# Patient Record
Sex: Female | Born: 1940 | Race: White | Smoking: Former smoker
Health system: Northeastern US, Academic
[De-identification: ages and names within clinical notes are randomized; demographics above are authoritative.]

## PROBLEM LIST (undated history)

## (undated) DIAGNOSIS — I10 Essential (primary) hypertension: Secondary | ICD-10-CM

## (undated) DIAGNOSIS — H269 Unspecified cataract: Secondary | ICD-10-CM

## (undated) DIAGNOSIS — H04123 Dry eye syndrome of bilateral lacrimal glands: Secondary | ICD-10-CM

## (undated) DIAGNOSIS — E785 Hyperlipidemia, unspecified: Secondary | ICD-10-CM

## (undated) DIAGNOSIS — G8929 Other chronic pain: Secondary | ICD-10-CM

## (undated) DIAGNOSIS — R7989 Other specified abnormal findings of blood chemistry: Secondary | ICD-10-CM

## (undated) DIAGNOSIS — K219 Gastro-esophageal reflux disease without esophagitis: Secondary | ICD-10-CM

## (undated) DIAGNOSIS — M1990 Unspecified osteoarthritis, unspecified site: Secondary | ICD-10-CM

## (undated) DIAGNOSIS — Z860100 Personal history of colon polyps, unspecified: Secondary | ICD-10-CM

## (undated) DIAGNOSIS — I714 Abdominal aortic aneurysm, without rupture, unspecified: Secondary | ICD-10-CM

## (undated) DIAGNOSIS — R339 Retention of urine, unspecified: Secondary | ICD-10-CM

## (undated) DIAGNOSIS — Z9889 Other specified postprocedural states: Secondary | ICD-10-CM

## (undated) DIAGNOSIS — M810 Age-related osteoporosis without current pathological fracture: Secondary | ICD-10-CM

## (undated) DIAGNOSIS — M479 Spondylosis, unspecified: Secondary | ICD-10-CM

## (undated) HISTORY — PX: SKIN BIOPSY: SHX1

## (undated) HISTORY — DX: Personal history of colon polyps, unspecified: Z86.0100

## (undated) HISTORY — PX: TUBAL LIGATION: SHX77

## (undated) HISTORY — DX: Abdominal aortic aneurysm, without rupture, unspecified: I71.40

## (undated) HISTORY — DX: Hyperlipidemia, unspecified: E78.5

## (undated) HISTORY — DX: Age-related osteoporosis without current pathological fracture: M81.0

## (undated) HISTORY — PX: EYE SURGERY: SHX253

## (undated) HISTORY — DX: Other specified postprocedural states: Z98.890

## (undated) HISTORY — DX: Retention of urine, unspecified: R33.9

## (undated) HISTORY — DX: Other specified abnormal findings of blood chemistry: R79.89

## (undated) HISTORY — DX: Essential (primary) hypertension: I10

## (undated) HISTORY — PX: HYSTERECTOMY: SHX81

## (undated) HISTORY — DX: Other chronic pain: G89.29

## (undated) HISTORY — PX: OTHER SURGICAL HISTORY: SHX169

## (undated) HISTORY — DX: Dry eye syndrome of bilateral lacrimal glands: H04.123

## (undated) HISTORY — DX: Unspecified cataract: H26.9

## (undated) HISTORY — DX: Personal history of colonic polyps: Z86.010

## (undated) HISTORY — DX: Unspecified osteoarthritis, unspecified site: M19.90

## (undated) HISTORY — DX: Gastro-esophageal reflux disease without esophagitis: K21.9

## (undated) HISTORY — PX: ABDOMINAL AORTIC ANEURYSM REPAIR: SHX42

## (undated) HISTORY — DX: Spondylosis, unspecified: M47.9

---

## 2000-11-20 ENCOUNTER — Other Ambulatory Visit: Payer: Self-pay | Admitting: Gastroenterology

## 2004-01-02 DIAGNOSIS — M5126 Other intervertebral disc displacement, lumbar region: Secondary | ICD-10-CM

## 2004-01-02 HISTORY — DX: Other intervertebral disc displacement, lumbar region: M51.26

## 2004-05-16 ENCOUNTER — Other Ambulatory Visit: Payer: Self-pay | Admitting: Gastroenterology

## 2009-01-01 HISTORY — PX: COLONOSCOPY: SHX174

## 2013-02-18 LAB — COMPREHENSIVE METABOLIC PANEL
ALT: 15
AST: 27
Albumin: 4.4
Alk Phos: 72
Anion Gap: 2
Bilirubin,Total: 0.3
CO2: 26
Calcium: 10.1
Chloride: 95
Creatinine: 0.7
GFR,Black: 99
GFR,Caucasian: 86
Glucose: 90
Lab: 9
Potassium: 5.4
Sodium: 138
Total Protein: 7.2

## 2013-02-18 LAB — LIPID PANEL
Chol/HDL Ratio: 1.7
Cholesterol: 210
HDL: 70
LDL Calculated: 122
Triglycerides: 89

## 2013-02-18 LAB — CBC AND DIFFERENTIAL
Baso # K/uL: 0.1 /uL
Basophil %: 1
Eos # K/uL: 0 /uL
Eosinophil %: 1
Hematocrit: 44
Hemoglobin: 14.4
Lymph # K/uL: 1.4 /uL
Lymphocyte %: 20
MCV: 95
Mono # K/uL: 0.7 /uL
Monocyte %: 9
Neut # K/uL: 5 /uL
Platelets: 322
RBC: 4.62
RDW: 13.5
Seg Neut %: 69
WBC: 7.3

## 2013-02-18 LAB — THYROXINE BINDING GLOBULIN: Thyroxine Bind Glob: 7.2

## 2013-02-18 LAB — CK: CK-MB: 69

## 2013-02-18 LAB — TSH: TSH: 1.58

## 2013-02-18 LAB — T4, FREE: T4: 1.22

## 2013-05-28 LAB — HEPATIC FUNCTION PANEL
ALT: 102
AST: 112
Albumin: 3.9
Alk Phos: 1123
Bilirubin,Direct: 0.17
Bilirubin,Total: 0.4
Protein,FL: 7

## 2013-05-28 LAB — LIPID PANEL
Chol/HDL Ratio: 1.6
Cholesterol: 164
HDL: 58
LDL Calculated: 93
Triglycerides: 64

## 2013-05-28 LAB — CK: CK-MB: 36

## 2013-06-01 LAB — HEPATIC FUNCTION PANEL
ALT: 69
AST: 54
Albumin: 4.2
Alk Phos: 847
Bilirubin,Direct: 0.1
Bilirubin,Total: 0.4
GGT: 996
Protein,FL: 7.4

## 2013-06-01 LAB — LIPASE: Lipase: 47

## 2013-06-01 LAB — AMYLASE: Amylase: 59

## 2013-06-02 ENCOUNTER — Encounter: Payer: Self-pay | Admitting: Gastroenterology

## 2013-06-18 LAB — HEPATIC FUNCTION PANEL
ALT: 16
AST: 27
Albumin: 4.1
Alk Phos: 254
Bilirubin,Direct: 0.05
Bilirubin,Total: 0.2
Protein,FL: 6.9

## 2013-10-15 ENCOUNTER — Ambulatory Visit: Payer: Self-pay | Admitting: Primary Care

## 2013-10-15 ENCOUNTER — Encounter: Payer: Self-pay | Admitting: Primary Care

## 2013-10-15 VITALS — BP 108/68 | HR 72 | Ht 64.96 in | Wt 126.4 lb

## 2013-10-15 DIAGNOSIS — M549 Dorsalgia, unspecified: Secondary | ICD-10-CM

## 2013-10-15 DIAGNOSIS — M810 Age-related osteoporosis without current pathological fracture: Secondary | ICD-10-CM | POA: Insufficient documentation

## 2013-10-15 DIAGNOSIS — Z23 Encounter for immunization: Secondary | ICD-10-CM

## 2013-10-15 DIAGNOSIS — E785 Hyperlipidemia, unspecified: Secondary | ICD-10-CM

## 2013-10-15 DIAGNOSIS — K219 Gastro-esophageal reflux disease without esophagitis: Secondary | ICD-10-CM

## 2013-10-15 DIAGNOSIS — Z1231 Encounter for screening mammogram for malignant neoplasm of breast: Secondary | ICD-10-CM

## 2013-10-15 DIAGNOSIS — M545 Low back pain, unspecified: Secondary | ICD-10-CM | POA: Insufficient documentation

## 2013-10-15 DIAGNOSIS — I714 Abdominal aortic aneurysm, without rupture, unspecified: Secondary | ICD-10-CM | POA: Insufficient documentation

## 2013-10-15 DIAGNOSIS — R7989 Other specified abnormal findings of blood chemistry: Secondary | ICD-10-CM

## 2013-10-15 NOTE — Patient Instructions (Addendum)
Start taking omeprazole 20 mg twice a day  Start Aleve 220mg  twice a day     Get labs done in next few weeks; do not restart cholesterol medication yet     Mammogram and DEXA scan to be done at [facility] in [place]

## 2013-10-15 NOTE — Progress Notes (Signed)
New Patient Visit    SUBJECTIVE  Patient here to establish care    Concerns today include:     Hyperlipidemia: Atorvastatin; but then had elevated LFTs, alk phos and they took her off and hasn't been on it since June; had an abdominal U/S also has abdominal aortic aneurysm     Back pain: herniated discs, had MRI 2002/2006; L5/S1 root compression; it was severe and couldn't walk at times due to pain. Had cortisone shots, had been on some pain medication but doesn't take it now (dislikes how she feels). Did try NSAIDs, Aleve helps a bit, but it aggravates her stomach. Was told to see a surgeon but is terrified of paralysis. When walking a lot gets sciatic on left side.   When sitting and goes to get up her front near hips hurt.   Also occasional knee pain.   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 Sleeps 5-6 hours; not sure if its just due to having to urinate or not; doesn't really seem to be something she wishes to address     Constipation: Metamucil wafers in am; some IBS-issues; does take a woman's laxative on occasion   Colonoscopy: 2011; some hemorrhoids, 1-2 polyps; unsure if they said to have it redone in 5 yrs     Pap: GYN had told you didn't need another one but its probably   Mammogram: done 2014; sister had breast cancer     Exercise: [employer] 2x a week   Diet: No specifics       Patient Active Problem List   Diagnosis Code    AAA (abdominal aortic aneurysm) I71.4    Osteoporosis M81.0    Hyperlipidemia E78.5    GERD (gastroesophageal reflux disease) K21.9    Back pain M54.9      Past Medical History   Diagnosis Date    AAA (abdominal aortic aneurysm)     Lumbar disc herniation     Urinary retention       Past Surgical History   Procedure Laterality Date    Tubal ligation      Hysterectomy       left ovaries in place      Family History   Problem Relation Age of Onset    Dementia Mother 28    Cancer Mother      lung    High cholesterol Mother     Parkinsonism Father     Heart disease Father 1     passed    High cholesterol Father     Cancer Sister      Breast; in recovery    High cholesterol Sister     Cancer Brother      prostate    High cholesterol Brother      History     Social History    Marital Status: Divorced     Spouse Name: N/A     Number of Children: N/A    Years of Education: N/A     Occupational History    Not on file.     Social History Main Topics    Smoking status: Former  Smoker -- 1.00 packs/day for 30 years     Quit date: 06/15/2013    Smokeless tobacco: Not on file    Alcohol Use: 5.4 oz/week     9 Glasses of wine per week    Drug Use: No    Sexual Activity: Not Currently     Other Topics Concern    Not on file     Social History Narrative    Lives alone currently in townhome     2 brothers and 2 sisters that live here    2 boys ([place], [place])    Grandchildren      No Known Allergies (drug, envir, food or latex)    Safety   Parameter Yes No Sometimes NA Details   Domestic Violence  x      Environmental Exposure  x      Sunscreen x       Dentist q 6 months x       Vision Exam q 2 years x       Seatbelt x       Cycling Helmet    x    Smoke detectors x       CO detectors x       Firearms   x          Health Care Maintenance   Parameter UTD Need NA Declined Given Comment   CRC screen (50+) x         Mammogram (F40-75)  x    12/04/12   Pap (F21-65)         Dexa (F65+M70+)  x    2013   Abd [place] (M65+) x        Lipids  x       Hepatitis C (1945-1965)   x      HIV    x     Tdap      Unsure   Prevnar (65+)1ST  x   x    Pneumovax (65+)  x       Zostavax  x       Gardasil    x      Flu  x             Review of Systems NEGATIVE UNLESS BOLD   General: Lack of energy, unexplained  weight gain or loss, loss of appetite, fever, night sweats, or weakness  ENT: Hearing difficulty, ear ringing or pain, sinus problems, nasal congestion, nosebleeds, mouth lesions, dental problems or throat pain  CV: irregular heartbeat, racing heart, chest pain, leg swelling, leg pain with walking, or DOE  Resp: shortness of breath, cough, coughing up blood, wheezing, or pleuritic pain  GI:  dysphagia, heartburn, nausea, vomiting, constipation, diarrhea, abdominal pain, blood in stool, melena, or changes in bowel habits  GY:FVCBSWH, urinary frequency or urgency, urinary incontinence, nocturia, or sexual dysfunction  MSK:  joint pain, joint swelling, back pain, myalgias, or muscle weakness  Derm:  rash, itching, new or changing skin lesions, hair loss or change  Neuro:  headache, vision changes, numbness, parasthesias, balance or gait difficulty, dizziness, tremor, or uncontrolled movements  Psych:  insomnia, irritability, depression, anxiety, mood swings, frequent anger, or hallucinations  Endocrine: heat or cold intolerance, menstrual irregularities, breast lumps, polydipsia, or libido changes  Heme:  easy bruising or bleeding or unexplained swelling  Allergy and Immunology: itchy or watery eyes or nose, no frequent infections    Current Outpatient Prescriptions   Medication Sig  omeprazole (PRILOSEC) 20 MG capsule Take 1 capsule (20 mg total) by mouth 2 times daily    Multiple Vitamins-Minerals (MULTIVITAMIN & MINERAL PO) Take 1 tablet by mouth daily    aspirin 81 MG tablet Take 81 mg by mouth daily    calcium-vitamin D (OSCAL-500) 500-200 MG-UNIT per tablet Take 2 tablets by mouth 2 times daily     OBJECTIVE  BP 108/68 mmHg   Pulse 72   Ht 1.65 m (5' 4.96")   Wt 57.335 kg (126 lb 6.4 oz)   BMI 21.06 kg/m2        Physical Exam:   GENERAL:  Appears stated age, no distress   HEAD: Normal, no trauma, bruises  EYES: PERRL, EOMI, clear conjunctiva    NOSE: clear passages, normal turbinates  EARS: Normal canals/TM    THROAT/MOUTH: Clear OP; no lesions, dentures   NECK: No lymphadenopathy, no thyromegaly   CV: Regular rate, rhythm, no murmur, good pedal pulses   LUNGS: CTA bilaterally, normal effort  ABD: BS+; non-tender to palpation, no HSM   EXT: warm, well perfused, no edema   MSK: No spinous process tenderness, Negative SLR on right and left. Mild lumbar tenderness  Lateral hip not TTP. Some anterior hip pain with palpation but almost normal hip ROM, negative fadir/faber, good hip flexion.   NEURO: No facial asymmetry; no weakness; can rise from chair without using arms   PSYCH: Normal affect/mood     ASSESSMENT & PLAN  Problem List Items Addressed This Visit     AAA (abdominal aortic aneurysm)    Relevant Medications       aspirin 81 MG tablet    Osteoporosis    Relevant Orders       DXA Scan       CBC       Basic metabolic panel    Hyperlipidemia    Relevant Medications       aspirin 81 MG tablet    Other Relevant Orders       Lipid add Rfx to Drt LDL if Trig >400    GERD (gastroesophageal reflux disease)    Relevant Medications       omeprazole (PriLOSEC) capsule    Back pain - Primary    Relevant Medications       aspirin 81 MG tablet    Other Relevant Orders       CBC      Other Visit Diagnoses     Need for prophylactic vaccination against Streptococcus pneumoniae (pneumococcus)         Relevant Orders        Pneumococcal conjugate vaccine 13-valent (Completed)     Visit for screening mammogram         Relevant Orders        Mammography screening bilateral     Elevated LFTs         Relevant Orders        Hepatic function panel        Lipase         Back pain: history of multiple level disc herniation but imaging is from 2002/2006; she is very fearful of the MRI due to claustrophobia and does not think she could do one again. Initially she sounded like she wanted to see a back specialist but after discussing with her the options they would do and that she'd like need further imaging she decided to wait it out for now.  On exam she didn't have signs of  nerve impingement/sciatica but it sounds like its intermittent to her. Discussed restarting Aleve just twice a day to be taken with food. I also want her to restart omeprazole 20 mg bid. She is going to [employer] and doing circuit training there. While this is good for her health/bone health at some point more focused back physical therapy and exercises may serve her better.     Hyperlipidemia with recent elevated LFTs; alk phos; was attributed to change in statin, however review of her U/S she does have cholelithiasis and her Alk phos was significantly higher than LFTs which makes me suspect gallstone obstruction and then possible worsening due to statin. She has been off medication since June now. Will repeat. If all normal could restart another statin at a low dose if needed based on her risk factors. She has now quit smoking for 4 months as well so her risk would have changed based on that alone.     GERD: was previously on omeprazole, also instructed to stop taking that when LFTs increased. Since she wants to restart Aleve I want her to take this in a divided dose for extra protection of her stomach. I also discussed with her that if she is taking aleve daily that we could stop her aspirin (cox inhibition) and this could also decrease her risk of a GI bleed.     Osteoporosis: patient reports that she does have this, I have no old records but she has not had a DEXA in over 2 years. Will send for one along with mammogram. We would have to discuss the risk vs. Benefit if another medication would be indicated. She's currently taking calcium and vitamin D     Immunizations: Denies ever having a pneumonia shot; or shingles vaccine. She likely truly did not have Prevnar 13; given today. Advised to return for flu shot next week for nurse visit or get at pharmacy (she didn't want 2 in one day). She would need pneumovax in a year if she truly hasn't had it. Also zostavax     AAA: 3.8X2.5cm;  U/S done 06/02/2013     Pt agrees with and understands plan.    Will obtain old records/results from previous physician      Return for Will schedule after lab results . Return to office sooner prn.      Karren Burly, MD

## 2013-10-16 MED ORDER — OMEPRAZOLE 20 MG PO CPDR *I*
20.0000 mg | DELAYED_RELEASE_CAPSULE | Freq: Two times a day (BID) | ORAL | Status: DC
Start: 2013-10-16 — End: 2013-10-24

## 2013-10-20 ENCOUNTER — Ambulatory Visit
Admit: 2013-10-20 | Discharge: 2013-10-20 | Disposition: A | Discharge status: Home / Self Care | Payer: Self-pay | Source: Ambulatory Visit | Attending: Primary Care | Admitting: Primary Care

## 2013-10-20 DIAGNOSIS — M549 Dorsalgia, unspecified: Secondary | ICD-10-CM

## 2013-10-20 DIAGNOSIS — R7989 Other specified abnormal findings of blood chemistry: Secondary | ICD-10-CM

## 2013-10-20 DIAGNOSIS — E785 Hyperlipidemia, unspecified: Secondary | ICD-10-CM

## 2013-10-20 DIAGNOSIS — M810 Age-related osteoporosis without current pathological fracture: Secondary | ICD-10-CM

## 2013-10-20 LAB — CBC
Hematocrit: 40 % (ref 34–45)
Hemoglobin: 13.4 g/dL (ref 11.2–15.7)
MCH: 31 pg/cell (ref 26–32)
MCHC: 33 g/dL (ref 32–36)
MCV: 93 fL (ref 79–95)
Platelets: 284 10*3/uL (ref 160–370)
RBC: 4.3 MIL/uL (ref 3.9–5.2)
RDW: 13.2 % (ref 11.7–14.4)
WBC: 5 10*3/uL (ref 4.0–10.0)

## 2013-10-20 LAB — BASIC METABOLIC PANEL
Anion Gap: 13 (ref 7–16)
CO2: 28 mmol/L (ref 20–28)
Calcium: 9.3 mg/dL (ref 8.6–10.2)
Chloride: 100 mmol/L (ref 96–108)
Creatinine: 0.88 mg/dL (ref 0.51–0.95)
GFR,Black: 75 *
GFR,Caucasian: 65 *
Glucose: 100 mg/dL — ABNORMAL HIGH (ref 60–99)
Lab: 13 mg/dL (ref 6–20)
Potassium: 5.1 mmol/L (ref 3.3–5.1)
Sodium: 141 mmol/L (ref 133–145)

## 2013-10-20 LAB — HEPATIC FUNCTION PANEL
ALT: 17 U/L (ref 0–35)
AST: 26 U/L (ref 0–35)
Albumin: 4.6 g/dL (ref 3.5–5.2)
Alk Phos: 65 U/L (ref 35–105)
Bilirubin,Direct: <0.2 mg/dL (ref 0.0–0.3)
Bilirubin,Total: 0.4 mg/dL (ref 0.0–1.2)
Total Protein: 7 g/dL (ref 6.3–7.7)

## 2013-10-20 LAB — LIPID PANEL
Chol/HDL Ratio: 3.1
Cholesterol: 269 mg/dL — AB
HDL: 88 mg/dL
LDL Calculated: 166 mg/dL
Non HDL Cholesterol: 181 mg/dL
Triglycerides: 73 mg/dL

## 2013-10-20 LAB — LIPASE: Lipase: 39 U/L (ref 13–60)

## 2013-10-23 ENCOUNTER — Encounter: Payer: Self-pay | Admitting: Primary Care

## 2013-10-23 ENCOUNTER — Other Ambulatory Visit: Payer: Self-pay

## 2013-10-23 DIAGNOSIS — K8020 Calculus of gallbladder without cholecystitis without obstruction: Secondary | ICD-10-CM | POA: Insufficient documentation

## 2013-10-23 DIAGNOSIS — K219 Gastro-esophageal reflux disease without esophagitis: Secondary | ICD-10-CM

## 2013-10-23 NOTE — Telephone Encounter (Signed)
Pt filled script at [facility] and was charged $14.00; next month she would like to fill script at [facility] so her insurance will cover this. Also pt asking to restart Simvastatin and has a couple weeks of med at home. She would like a call back letting her know if this would be okay or not.

## 2013-10-24 MED ORDER — OMEPRAZOLE 20 MG PO CPDR *I*
20.0000 mg | DELAYED_RELEASE_CAPSULE | Freq: Two times a day (BID) | ORAL | Status: DC
Start: 2013-10-24 — End: 2014-01-21

## 2013-10-26 ENCOUNTER — Other Ambulatory Visit: Payer: Self-pay | Admitting: Primary Care

## 2013-10-26 MED ORDER — ATORVASTATIN CALCIUM 10 MG PO TABS *I*
10.0000 mg | ORAL_TABLET | Freq: Every day | ORAL | Status: DC
Start: 2013-10-26 — End: 2013-10-27

## 2013-10-27 ENCOUNTER — Encounter: Payer: Self-pay | Admitting: Primary Care

## 2013-10-27 MED ORDER — SIMVASTATIN 5 MG PO TABS *I*
5.0000 mg | ORAL_TABLET | Freq: Every day | ORAL | Status: DC
Start: 2013-10-27 — End: 2014-01-21

## 2013-11-03 ENCOUNTER — Encounter: Payer: Self-pay | Admitting: Primary Care

## 2013-11-03 ENCOUNTER — Telehealth: Payer: Self-pay | Admitting: Primary Care

## 2013-11-03 DIAGNOSIS — D126 Benign neoplasm of colon, unspecified: Secondary | ICD-10-CM | POA: Insufficient documentation

## 2013-11-03 NOTE — Telephone Encounter (Signed)
Patient called in to schedule her flu shot but also inquired about discussing with Dr. Ruthy Dick taking simvastatin again. Was concerned because she previously was taking 40mg , but says Dr. Ruthy Dick only suggested 5mg . Wondering why the dose is so low because her cholesterol jumped from 210 to 269 while she was off due to her aneurysm?

## 2013-11-03 NOTE — Telephone Encounter (Signed)
Left message for patient to call back  (Patient was taking Atorvastatin, but started to bother her stomach - starting on low dose Simvastatin -  If not bothering her stomach can increase per Dr. Ardeen Jourdain notes.)

## 2013-11-04 ENCOUNTER — Ambulatory Visit: Payer: Self-pay

## 2013-11-04 DIAGNOSIS — Z23 Encounter for immunization: Secondary | ICD-10-CM

## 2013-11-04 NOTE — Progress Notes (Signed)
Injection given per protocol - patient tolerated procedure well

## 2013-12-10 ENCOUNTER — Ambulatory Visit
Admit: 2013-12-10 | Discharge: 2013-12-10 | Disposition: A | Discharge status: Home / Self Care | Payer: Self-pay | Source: Ambulatory Visit | Attending: Primary Care | Admitting: Primary Care

## 2013-12-10 ENCOUNTER — Ambulatory Visit: Payer: Self-pay

## 2013-12-10 ENCOUNTER — Encounter: Payer: Self-pay | Admitting: Gastroenterology

## 2013-12-10 DIAGNOSIS — M810 Age-related osteoporosis without current pathological fracture: Secondary | ICD-10-CM

## 2013-12-10 LAB — HM DEXA SCAN

## 2013-12-11 NOTE — Procedures (Signed)
Providers: DXA scan for your review.  Images and report are located under the media tab.  For Mychart active patients, please contact your referring provider for results and/or for  requests for a copy of this result.

## 2013-12-18 ENCOUNTER — Other Ambulatory Visit: Payer: Self-pay | Admitting: Radiology

## 2013-12-23 ENCOUNTER — Encounter: Payer: Self-pay | Admitting: Gastroenterology

## 2014-01-12 ENCOUNTER — Encounter: Payer: Self-pay | Admitting: Primary Care

## 2014-01-13 NOTE — Telephone Encounter (Signed)
Results scanned in system - please advise

## 2014-01-21 ENCOUNTER — Telehealth: Payer: Self-pay

## 2014-01-21 ENCOUNTER — Ambulatory Visit: Payer: Self-pay | Admitting: Primary Care

## 2014-01-21 ENCOUNTER — Encounter: Payer: Self-pay | Admitting: Primary Care

## 2014-01-21 VITALS — BP 124/72 | HR 76 | Ht 64.96 in | Wt 137.1 lb

## 2014-01-21 DIAGNOSIS — M810 Age-related osteoporosis without current pathological fracture: Secondary | ICD-10-CM

## 2014-01-21 DIAGNOSIS — E785 Hyperlipidemia, unspecified: Secondary | ICD-10-CM

## 2014-01-21 DIAGNOSIS — R7989 Other specified abnormal findings of blood chemistry: Secondary | ICD-10-CM

## 2014-01-21 DIAGNOSIS — K219 Gastro-esophageal reflux disease without esophagitis: Secondary | ICD-10-CM

## 2014-01-21 DIAGNOSIS — M25551 Pain in right hip: Secondary | ICD-10-CM

## 2014-01-21 MED ORDER — ALENDRONATE SODIUM 70 MG PO TABS *I*
70.0000 mg | ORAL_TABLET | ORAL | Status: DC
Start: 2014-01-21 — End: 2014-02-15

## 2014-01-21 MED ORDER — OMEPRAZOLE 20 MG PO CPDR *I*
20.0000 mg | DELAYED_RELEASE_CAPSULE | Freq: Two times a day (BID) | ORAL | Status: DC
Start: 2014-01-21 — End: 2015-01-26

## 2014-01-21 MED ORDER — SIMVASTATIN 10 MG PO TABS *I*
10.0000 mg | ORAL_TABLET | Freq: Every day | ORAL | Status: DC
Start: 2014-01-21 — End: 2014-01-21

## 2014-01-21 MED ORDER — TRAMADOL HCL 50 MG PO TABS *I*
50.0000 mg | ORAL_TABLET | Freq: Two times a day (BID) | ORAL | Status: DC | PRN
Start: 2014-01-21 — End: 2014-08-27

## 2014-01-21 MED ORDER — SIMVASTATIN 10 MG PO TABS *I*
10.0000 mg | ORAL_TABLET | Freq: Every day | ORAL | Status: DC
Start: 2014-01-21 — End: 2014-04-22

## 2014-01-21 NOTE — Telephone Encounter (Signed)
-----   Message from Karren Burly, MD sent at 01/21/2014 11:37 AM EST -----  Regarding: Fosamax   I have sent in Fosamax for her to try again to see how she tolerates it. Through [facility] she can get it for 9 dollars without any insurance for a month's supply.     I have printed one for her here, we can mail it to her if she'd like or she can use the app if she has a smart phone or print it at home if she wants.     Can you call her to let her know and she if she'll try it. I looked into another type but its too expensive even with coupon.

## 2014-01-21 NOTE — Progress Notes (Signed)
Follow-up      Patient is here today to follow-up on :       Hip pain is worsening on the right; back pain is about the same  [place] [address] about a month and a half ago; and fell on her right side. Now it hurts to walk, its actually a little better than it was initially she was really struggling.   Some days she has to pick her leg up to get in the car, some days she goes to her hands and knees to go up stairs. Other days she's in pain but can do most things.   She's been using aleve to help with the pain but is not able to sleep because its that uncomfortable.     Osteoporosis: She called her old office, her last DEXA showed -3.0; now she's -3.3; she had tried fosamax in the past but it gave her abdominal cramps for the 2-3 days after she took it. She is afraid she wouldn't be able to afford alternatives. Is taking calcium and vitamin D now; wants to know if daying Co-Q 10 or Glucosamine would help. I advised her not likely.   She wants to delay any referrals about this until spring because she won't drive there.     She needs handicap parking paperwork today as well. I completed that.     HLD: had elevated LFTs so I restarted her slowly on simvastatin. Will increase to 10 mg today; she's not had any issues with the 5 mg     GERD: on omeprazole BID and that's working well although if she takes 3 aleve she just get some stomach discomfort     AAA: needs repeat ultrasound but doesn't want to go in winter, will re-order in spring.      ROS: See HPI     The patient's past medical history, problem list, medications and allergies reviewed in electronic chart today.      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     Current outpatient prescriptions: naproxen sodium (ANAPROX) 220 MG tablet, Take 220 mg by mouth 2  times daily (with meals), Disp: , Rfl: ;  Multiple Vitamins-Minerals (MULTIVITAMIN & MINERAL PO), Take 1 tablet by mouth daily, Disp: , Rfl: ;  aspirin 81 MG tablet, Take 81 mg by mouth daily, Disp: , Rfl: ;  calcium-vitamin D (OSCAL-500) 500-200 MG-UNIT per tablet, Take 2 tablets by mouth 2 times daily, Disp: , Rfl:   traMADol (ULTRAM) 50 MG tablet, Take 1 tablet (50 mg total) by mouth 2 times daily as needed for Pain   Max daily dose: 100 mg, Disp: 60 tablet, Rfl: 0;  omeprazole (PRILOSEC) 20 MG capsule, Take 1 capsule (20 mg total) by mouth 2 times daily, Disp: 180 capsule, Rfl: 1;  simvastatin (ZOCOR) 10 MG tablet, Take 1 tablet (10 mg total) by mouth daily (with dinner), Disp: 90 tablet, Rfl: 3            BP 124/72 mmHg   Pulse 76   Ht 1.65 m (5' 4.96")   Wt 62.188 kg (137 lb 1.6 oz)   BMI 22.84 kg/m2  Exam:     General: Alert, no distress  HEENT: clear conjunctiva  CV: Regular rate, rhythm, no murmur, good pedal pulses   LUNGS: CTA bilaterally   MSK: Right sided hip pain at groin and lateral hip but not really tender over greater trochanter. Pain with FADIR/FABER testing. Pretty normal log-roll compared to left. Pain with hip flexion, and pain with Ober's test.         Problem List Items Addressed This Visit        Digestive    GERD (gastroesophageal reflux disease)    Relevant Medications       omeprazole (PriLOSEC) capsule       Endocrine    Hyperlipidemia    Relevant Medications       simvastatin (ZOCOR) tablet    Other Relevant Orders       Lipid add Rfx to Drt LDL if Trig >400       Other    Osteoporosis      Other Visit Diagnoses     Right hip pain    -  Primary     Relevant Orders        * Hip RIGHT AP and Lateral views     Elevated liver function tests         Relevant Orders        Comprehensive metabolic panel           Right hip pain: will get xray; may also have an element of ITB syndrome given location of pain, sent exercises to try at home.   Will start tramadol for a night pain as she's having  trouble sleeping due to the discomfort. There is a possibliity this is coming from her back as well but patient again states she'll never do another MRI so I don't know if we'd be able further treat it without knowing the extend of the problem there.   Handicap parking forms completed  Can continue aleve but only twice a day.     Osteoporosis: advised patient she is at VERY high risk for fracture and calc and vita D is not enough because she's been on that and in two years has gone from -3.0 to -3.3. She's lucky she didn't fracture anything with recent fall.   Would try risedronate but she cannot afford it.   Will try Fosamax again, if she doesn't tolerate it then she'll need to see endocrinology about possible injection treatments.     HLD: uncontrolled, gradually increaseing simvastatin given eleveted LFTs last year. Will go to 10 mg and check again in three months    GERD: continue omeprazole especially in light of aleve use and restarting fosamax if patient will do so.       F/U 3 months     Karren Burly, MD  [facility] [address]  [place] [address]

## 2014-01-21 NOTE — Telephone Encounter (Signed)
Spoke with pt and informed her about the coupon and she stated that she would print it out and if she cannot figure out how to get the coupon she will call back and have [place] mail it to her.

## 2014-01-26 ENCOUNTER — Ambulatory Visit
Admit: 2014-01-26 | Discharge: 2014-01-26 | Disposition: A | Discharge status: Home / Self Care | Payer: Self-pay | Source: Ambulatory Visit | Attending: Primary Care | Admitting: Primary Care

## 2014-01-26 ENCOUNTER — Other Ambulatory Visit: Payer: Self-pay | Admitting: Primary Care

## 2014-02-15 ENCOUNTER — Encounter: Payer: Self-pay | Admitting: Primary Care

## 2014-02-15 DIAGNOSIS — M810 Age-related osteoporosis without current pathological fracture: Secondary | ICD-10-CM

## 2014-02-15 MED ORDER — ALENDRONATE SODIUM 70 MG PO TABS *I*
70.0000 mg | ORAL_TABLET | ORAL | Status: DC
Start: 2014-02-15 — End: 2014-03-15

## 2014-03-15 ENCOUNTER — Other Ambulatory Visit: Payer: Self-pay | Admitting: Primary Care

## 2014-03-15 DIAGNOSIS — M810 Age-related osteoporosis without current pathological fracture: Secondary | ICD-10-CM

## 2014-03-15 MED ORDER — ALENDRONATE SODIUM 70 MG PO TABS *I*
70.0000 mg | ORAL_TABLET | ORAL | Status: DC
Start: 2014-03-15 — End: 2014-05-10

## 2014-04-15 ENCOUNTER — Ambulatory Visit
Admit: 2014-04-15 | Discharge: 2014-04-15 | Disposition: A | Discharge status: Home / Self Care | Payer: Self-pay | Source: Ambulatory Visit | Attending: Primary Care | Admitting: Primary Care

## 2014-04-15 DIAGNOSIS — E785 Hyperlipidemia, unspecified: Secondary | ICD-10-CM

## 2014-04-15 DIAGNOSIS — R7989 Other specified abnormal findings of blood chemistry: Secondary | ICD-10-CM

## 2014-04-15 LAB — COMPREHENSIVE METABOLIC PANEL
ALT: 18 U/L (ref 0–35)
AST: 26 U/L (ref 0–35)
Albumin: 4.8 g/dL (ref 3.5–5.2)
Alk Phos: 61 U/L (ref 35–105)
Anion Gap: 16 (ref 7–16)
Bilirubin,Total: 0.4 mg/dL (ref 0.0–1.2)
CO2: 25 mmol/L (ref 20–28)
Calcium: 9 mg/dL (ref 8.6–10.2)
Chloride: 98 mmol/L (ref 96–108)
Creatinine: 0.8 mg/dL (ref 0.51–0.95)
GFR,Black: 84 *
GFR,Caucasian: 73 *
Glucose: 103 mg/dL — ABNORMAL HIGH (ref 60–99)
Lab: 15 mg/dL (ref 6–20)
Potassium: 4.7 mmol/L (ref 3.3–5.1)
Sodium: 139 mmol/L (ref 133–145)
Total Protein: 7.4 g/dL (ref 6.3–7.7)

## 2014-04-15 LAB — LIPID PANEL
Chol/HDL Ratio: 2.9
Cholesterol: 231 mg/dL — AB
HDL: 79 mg/dL
LDL Calculated: 137 mg/dL
Non HDL Cholesterol: 152 mg/dL
Triglycerides: 73 mg/dL

## 2014-04-16 ENCOUNTER — Encounter: Payer: Self-pay | Admitting: Primary Care

## 2014-04-22 ENCOUNTER — Ambulatory Visit: Payer: Self-pay | Admitting: Primary Care

## 2014-04-22 ENCOUNTER — Telehealth: Payer: Self-pay | Admitting: Primary Care

## 2014-04-22 ENCOUNTER — Encounter: Payer: Self-pay | Admitting: Primary Care

## 2014-04-22 VITALS — BP 124/76 | HR 72 | Wt 138.9 lb

## 2014-04-22 DIAGNOSIS — M545 Low back pain, unspecified: Secondary | ICD-10-CM

## 2014-04-22 DIAGNOSIS — M25551 Pain in right hip: Secondary | ICD-10-CM

## 2014-04-22 DIAGNOSIS — E785 Hyperlipidemia, unspecified: Secondary | ICD-10-CM

## 2014-04-22 DIAGNOSIS — I714 Abdominal aortic aneurysm, without rupture, unspecified: Secondary | ICD-10-CM

## 2014-04-22 MED ORDER — ATORVASTATIN CALCIUM 20 MG PO TABS *I*
20.0000 mg | ORAL_TABLET | Freq: Every day | ORAL | Status: DC
Start: 2014-04-22 — End: 2014-04-23

## 2014-04-22 NOTE — Telephone Encounter (Signed)
Patient's pharmacy called stating patient came to pick up her Rx and believes the wrong medication was sent.  The medication that was sent was Atorvastatin 20 MG, patient states she believes her previous dose of Simvastatin 10 MG was being increased to Simvastatin 20 MG.  Which medication is correct?

## 2014-04-22 NOTE — Progress Notes (Signed)
Follow-up      Patient is here today to follow-up on :         Hip pain: It has not improved; she feels it both in front and back of her hip. Hip xray showed normal joint spaces and generalized osteopenia but no signs of severe arthritis. She reports pain in groin, laterally and in her low-back/buttock area.   She's had worsening issues with her back again as well, now any kind of twisting motion causes pain and she has to stop what she's doing   -she's also having pain shooting down her entire right leg as well    Also has DNR order with her today; but she didn't understand it meant no attempt; she thought it meant they wouldn't keep her on machines. Explained she needs a health care proxy or living will stating her specific wishes not a DNR as she would like a trial of intubation.     Labs:        Lab results: 04/15/14  0915   CHOLESTEROL 231*   HDL 79   LDL CALCULATED 137   TRIGLYCERIDES 73   CHOL/HDL RATIO 2.9       No components found with this basename: NHLDC  Improving cholesterol on simvastatin 10 mg; no sign of irritation of liver at this point as had occurred in the past. Patient feels comfortable increasing to 20 mg at this time    AAA: patient now willing to go for repeat Ultrasound.         ROS: See HPI     The patient's past medical history, problem list, medications and allergies reviewed in electronic chart today.      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 Do not eat or lie down for 30 min., Disp: 4 tablet, Rfl: 3;  naproxen sodium (ANAPROX) 220 MG tablet, Take 220 mg by mouth 2 times daily (with meals), Disp: , Rfl: ;   omeprazole (PRILOSEC) 20 MG capsule, Take 1 capsule (20 mg total) by mouth 2 times daily, Disp: 180 capsule, Rfl: 1  Multiple Vitamins-Minerals (MULTIVITAMIN & MINERAL PO), Take 1 tablet by mouth daily, Disp: , Rfl: ;  aspirin 81 MG tablet, Take 81 mg by mouth daily, Disp: , Rfl: ;  calcium-vitamin D (OSCAL-500) 500-200 MG-UNIT per tablet, Take 2 tablets by mouth 2 times daily, Disp: , Rfl: ;  simvastatin (ZOCOR) 20 MG tablet, Take 1 tablet (20 mg total) by mouth daily (with dinner), Disp: 30 tablet, Rfl: 5  traMADol (ULTRAM) 50 MG tablet, Take 1 tablet (50 mg total) by mouth 2 times daily as needed for Pain   Max daily dose: 100 mg, Disp: 60 tablet, Rfl: 0            BP 124/76 mmHg   Pulse 72   Wt 63.005 kg (138 lb 14.4 oz)  Exam:     General: Alert, no distress  MSK: Right hip: TTP along lateral trochanter, groin, pain with SLR (both in hip and down leg) pain with FADIR/FABER.  Back: Pain with rotation, flexion and extension. SLR+ on right but denies sensory disturbance         Problem List Items Addressed This Visit        Circulatory    AAA (abdominal aortic aneurysm)    Relevant Orders       AAA Screening      Other Visit Diagnoses     Lumbar back pain    -  Primary     Relevant Orders        AMB REFERRAL TO ORTHOPEDIC SURGERY     Right hip pain         HLD (hyperlipidemia)               Lumbar back pain with radiculopathy: patient doesn't want another MRI but feels her back pain is worsening; she's not sure if that's what's causing the hip pain as well   -will refer to ortho; specifically PMR through Ortho about potential other treatments such as injections; I don't know if they would want to do an EMG first or repeat xrays   Hip pain: I think the hip pain may be referred from her back; as it doesn't really fit with a particular pattern, and xrays are relatively benign. She does have some lateral tenderness so if desired we could do a lateral hip injection if needed but I don't often do them so orthopedics  would probably be better.     AAA: repeat abdominal U/S to f/u which is overdue    HLD: Increase Simvastatin to 20 mg from 10 mg; repeat labs in 3 months           Karren Burly, MD  [facility] [id number]

## 2014-04-23 MED ORDER — SIMVASTATIN 20 MG PO TABS *I*
20.0000 mg | ORAL_TABLET | Freq: Every day | ORAL | Status: DC
Start: 2014-04-23 — End: 2014-09-17

## 2014-05-10 ENCOUNTER — Other Ambulatory Visit: Payer: Self-pay | Admitting: Primary Care

## 2014-05-10 DIAGNOSIS — M810 Age-related osteoporosis without current pathological fracture: Secondary | ICD-10-CM

## 2014-05-10 MED ORDER — ALENDRONATE SODIUM 70 MG PO TABS *I*
70.0000 mg | ORAL_TABLET | ORAL | Status: DC
Start: 2014-05-10 — End: 2014-06-07

## 2014-05-19 ENCOUNTER — Encounter: Payer: Self-pay | Admitting: Physical Medicine and Rehabilitation

## 2014-05-19 ENCOUNTER — Ambulatory Visit: Payer: Self-pay | Admitting: Physical Medicine and Rehabilitation

## 2014-05-19 VITALS — BP 118/86 | Ht 65.0 in | Wt 137.0 lb

## 2014-05-19 DIAGNOSIS — M47817 Spondylosis without myelopathy or radiculopathy, lumbosacral region: Secondary | ICD-10-CM

## 2014-05-19 NOTE — Progress Notes (Signed)
Dear Dr. Ruthy Dick, Janett Billow, MD,     [name] was seen on 05/19/2014 at the [facility].    Chief complaint: Low back and right groin pain    History of present illness:[name] is a 74 y.o. female with complaints of low back and right groin pain.  The patient has had issues for 20 years but worse since July 2015.  The patient also injured her right groin and has increased issues with her hip.  She describes axial back pain is her primary chronic issue and she is wondering if alternative options might be available for treatment.   She is here to determine if we have an options available.  She indicates clearly that she has no interest in epidural steroid injections, or repeat MRI due to her severe claustrophobia.  She has not had physical therapy for her issue.    Exacerbating factors: vacuuming, making the bed, flexion     Relieving factors:sitting, laying down    Prior treatment:spine injections - trigger point - 6-8 months with relief, Aleve, Alendronate    Past Medical History   Diagnosis Date    AAA (abdominal aortic aneurysm)     Lumbar disc herniation 2006     L5-S1    Urinary retention     Spondylosis     Elevated LFTs      Unclear if this was related to statin, gallstones, alcohol consumption     Hx of colonic polyps      Colonscopy 08/11/10; repeat 2017 per GI      Past Surgical History   Procedure Laterality Date    Tubal ligation      Hysterectomy       left ovaries in place      History     Social History    Marital Status: Divorced     Spouse Name: N/A     Number of Children: N/A    Years of Education: N/A     Occupational History    Not on file.     Social History Main Topics    Smoking status: Former Smoker -- 1.00 packs/day for 30 years     Quit date: 06/15/2013    Smokeless tobacco: Not on file    Alcohol Use: 5.4 oz/week     9 Glasses of wine per week    Drug Use: No    Sexual Activity: Not Currently     Other Topics Concern    Not on file      Social History Narrative    Lives alone currently in townhome     2 brothers and 2 sisters that live here    2 boys ([place], [place])    Grandchildren      Family History   Problem Relation Age of Onset    Dementia Mother 56    Cancer Mother      lung    High cholesterol Mother     Parkinsonism Father     Heart disease Father 85     passed    High cholesterol Father     Cancer Sister      Breast; in recovery    High cholesterol Sister     Cancer Brother      prostate    High cholesterol Brother        Review of systems:     General: Healthy []  Recent weight loss []  Recent weight gain[]  Fatigue []   MSK: pain in multiple joints []  joint swelling []   Neuro: Numbness/tingling []  weakness []  seizures []   ID: Recent illness []  Fever []  Chills []   GU: Urinary retention []  Urinary frequency []  Pin with urination []   GI: Constipation []  Diarrhea []  Abdominal pain []  Heartburn []   Skin: Rashes []  skin lesions []   Psychiatric: Anxiety []  Depression []   Hematologic: Blood clots []  easy bruising []   Endocrine: cold or heat intolerance []   Respiratory: Shortness of breath []  Cough []   Cardiovascular: Chest pain []  Palpitations []   ENT: Difficulty swallowing []   The rest as stated in the history of present illness.    Physical Examination:  General:  Pleasant, straightforward, healthy appearing individual.  Mental Status:  Oriented to person, place, and time. Displays appropriate mood and affect.  Gait:  Gait is normal.  Strength Testing:  Bilateral lower extremity strength is normal and symmetric. No atrophy or tone abnormalities noted.  Sensation:  Normal, No loss of sensation in the lower extremities or trunk.  Neuro:  Bilateral lower extremity coordination and reflexes are physiologic and symmetric.  Spine ROM: She has limited spinal extension side bending as well, flexion is normal  Peripheral Joint ROM:  Peripheral joint range of motion is full and pain free without obvious instability or laxity in all  four extremities.  Straight Leg Raising and/or Foramen Compression Maneuvers:  Straight leg raising in the seated and supine positions are negative for radicular pain.  Palpation:  Inspection and palpatory examination of the Lumbar spine and lower extremities is remarkable for tenderness of the lumbar paraspinal at the lumbosacral junction along her iliac crest bilaterally. No lymphadenopathy is noted.  Peripheral Vascular:  Pedal pulses are intact. No obvious extremity swelling in the lower extremities.  Appearance of Skin:  Skin inspection of the trunk and lower extremities is unremarkable for this issue.  Special Testing:  None.    Diagnostic testing and results: AP pelvis views demonstrate severe L4-L5 and L5-S1 disc space narrowing.  Her MRI is from [place] open from 2002 and 2006 demonstrate multilevel disc degeneration with protrusions with mild to moderate foraminal narrowing at varying levels.  I cannot appreciate any abnormality in her right hip.    Impression:   Lumbar spondylosis with low back and iliac crest region pain chronic  Right groin pain after hip flexor strain but without evidence of significant narrowing on plain films of the hip    Plan:  I reviewed the options with the patient in detail.  The patient had good results with trigger point injections in [place].  I will have her seen by Dr. Christen Bame to determine if these may be helpful.  Additionally I will get her started in physical therapy for her back here at our [place] office.  She is severely claustrophobic and at this point not interested in evaluating other options with repeat MRI.    Thank you for allowing [place] to participate in the care of this patient. Please do not hesitate to call us with any questions.    Briant Sites, MD 05/19/2014 1:40 PM

## 2014-06-04 ENCOUNTER — Ambulatory Visit: Payer: Self-pay | Admitting: Rehabilitative and Restorative Service Providers"

## 2014-06-04 DIAGNOSIS — M545 Low back pain, unspecified: Secondary | ICD-10-CM

## 2014-06-04 NOTE — Progress Notes (Signed)
SPORTS PT CHARGE:   Evaluation:  PT CODE 3302 - CPT CODE PT - 97001 - Initial Eval (30 minutes)  Therapeutic Procedures:  PT CODE 314 - CPT CODE PT - 97110 - Therapeutic Exercise (ea 15 min) x 1 unit  Total Minute Time: 45 min  Status/POC:

## 2014-06-04 NOTE — Progress Notes (Signed)
[facility]  LUMBAR SPINE EVALUATION      Diagnosis: Low back and right groin pain  Onset date: Spring 2016  Date of surgery: NA    SUBJECTIVE    [name] is a 74 y.o. female who is present today for back, right hip care.  Mechanism of injury/history of symptoms: Pulled her R hip flexor muscle in spring while in yoga. Moved up to [place] and then fell on R hip.    Night Pain: Yes   Changes in bladder control: No / Unexplained weight loss: No    Occupation and Activities  Work status: N/A  Job title/type of work: Retired  Brewing technologist of job: NA   Stresses/physical demands of home: [place], Gardening/Yard Work and Sports   Sport/Activities: Curves    Diagnostic tests: AP pelvis views demonstrate severe L4-L5 and L5-S1 disc space narrowing. Her MRI is from [place] open from 2002 and 2006 demonstrate multilevel disc degeneration with protrusions with mild to moderate foraminal narrowing at varying levels. I cannot appreciate any abnormality in her right hip (per Karle Starch note)  Other: Unable to ascend and descend stairs  Symptom location: across bilateral low back lateral L hip   Relevant symptoms: Pain , Decreased ROM, Decreased strength   Symptom frequency: Constant  Symptom intensity (0 - 10 scale): Now 10 Best 2 Worst 10    Symptoms worsen with: vacuuming, making the bed, flexion, getting out of chair after being seated, sitting more than 5 minutes, walking more than 5 minutes, standing more than 15 minutes  Symptoms improve with: sitting in recliner, laying down on L side, Aleve  Assistive device:  none  Patients goals for therapy: Return to sport, Reduce pain, Increase ROM, Increase strength, Independent with home program, Be able to walk around the block      OBJECTIVE    Observation  Patient was able to ambulate into the department with a antalgic gait pattern.  The patient was able to perform basic bed mobility independent.    Movement Screen   The  patient was able to perform heel walking and was able to perform toe walking during the examination.  The patient was unable to squat to the floor during the examination.     Palpation  Generalized lumbar soreness and Generalized pelvic/hip soreness      ROM Loss  Flexion: moderate loss, pain  Extension:  moderate loss  Left Sidebend: moderate loss  Right Sidebend:  moderate loss  Left Rotation:  moderate loss  Right Rotation:  moderate loss      Postural Correction  Sitting Correction: symptoms reduced:  Indifferent  Standing Correction: symptoms reduced:  Indifferent    Repeated Movement Testing  NA    Directional Preference:  Neutral    Neuromuscular Assessment  Myotomes:  LE L hip FLEX 3+/5, hip ABD 4/5, hip ER 4/5, hip EXT with knee FLEX 3-/5, all others 4+/5    Special Tests  Lumbar:  Slump, Right LE  negative, Left LE   negative  Straight Leg Raise,  Right LE  negative, Left LE   negative  Sacroilliac Joint:  NA   Cervical Spine:  NA                       Flexibility:   Hamstrings: Moderate tightness  Quads: Significant tightness      Isometric Tests/Motor Control  Ability to brace abdomen in sitting and supine  Abdominal  Activation: yes  Drawing in abdomen:  yes  Neutral spine:  yes    Ability to isometrically contract glutes in sitting/supine  Glute activation:  yes  Hamstring contraction:  yes  Neutral spine:  no, posterior pelvic tilt      Dynamic Stability Tests  Bridge:  pass  / Glutes: active  Hamstrings: inactive  Abdominal Wall:  inactive            ASSESSMENT    McGill Stage:  Corrective exercise    Findings consistent with 74 y.o. female with  with pain, ROM limitations, strength limitations, functional limitations    Prognosis:  Good    Contraindications/Precautions/Limitation:  Per diagnosis    Short Term Goals:4week(s) Decrease c/o max pain to < 4/10, Increase strength (able to perform level I spinal stabilization exercises) and Minimal assistance with HEP/ education concepts   Long Term  Goals:92month(s) Walk community distances without increased symptoms, Stand as long as needed when completing ADL's without increased symptoms, Sit as long as needed with ADL's without increased symptoms, Transfer from sit to stand and perform bed mobility without increased symptoms, Patient demonstrates improved functional core strength, Patient will return to full prior level of function  , Independent with symptom management    TREATMENT PLAN  Patient/family involved in developing goals and treatment plan: Yes    Recommended Treatment Freq 2 times per month(s) for 3 month(s)    Treatment plan inclusive of:   Exercise:AROM, AAROM, PROM, Stretching, Progressive Resistive, Aerobic exercise   Manual Techniques:Myofascial Release, Joint mobilization    Modalities:  Cold pack, Electrical Stimulation, unattended, Type  Interferential, 15 min , Functional/Therapeutic activites per flowsheet, Moist heat, Ther Exercise per flowsheet   Functional: Proprioception/Dynamic stability, Functional rehab    Patient was educated on how the principles of the Active Spine Program will benefit their condition.  Patient was educated on the benefits of maintaining an active lifestyle. The patient was instructed on core strengthening exercises for spine support and stability; flexibility exercise for the upper and lower extremities to reduce movement stress to the spine; and aerobic exercise for weight management and endurance.  The patient was also educated on proper posture and body mechanics to avoid painful movements and to work on any directional preference positions.  Modalities for pain control, and traction will be used as necessary to benefit the patients symptoms.          Patient was instructed how to gradually progress their exercise routine within their range of tolerable symptoms.  Patient was advised that maintenance and consistency with the exercise routine at home is key to success.  And that it is critical that they  notify their therapist of any prescribed activity that increases their symptoms so that the routine may be modified and progressed.      Thank you for the referral of this patient to the [facility], PT    Clamshell 1-3x10   Curl-up 5"x10   Bridge 1-3x10 Level I   Unilateral bridge/roll 1-3x10       Sit-to-stand 1-2x5-10 orange at knees 1 airex in chair

## 2014-06-07 ENCOUNTER — Other Ambulatory Visit: Payer: Self-pay | Admitting: Primary Care

## 2014-06-07 DIAGNOSIS — M810 Age-related osteoporosis without current pathological fracture: Secondary | ICD-10-CM

## 2014-06-07 MED ORDER — ALENDRONATE SODIUM 70 MG PO TABS *I*
70.0000 mg | ORAL_TABLET | ORAL | 1 refills | Status: DC
Start: 2014-06-07 — End: 2014-12-14

## 2014-06-09 ENCOUNTER — Encounter: Payer: Self-pay | Admitting: Physical Medicine and Rehabilitation

## 2014-06-09 ENCOUNTER — Ambulatory Visit: Payer: Self-pay | Admitting: Physical Medicine and Rehabilitation

## 2014-06-09 VITALS — BP 158/83 | HR 83 | Wt 139.0 lb

## 2014-06-09 DIAGNOSIS — M47817 Spondylosis without myelopathy or radiculopathy, lumbosacral region: Secondary | ICD-10-CM

## 2014-06-09 MED ORDER — GABAPENTIN 300 MG PO CAPSULE *I*
300.0000 mg | ORAL_CAPSULE | Freq: Every evening | ORAL | 2 refills | Status: DC
Start: 2014-06-09 — End: 2014-09-17

## 2014-06-09 NOTE — Progress Notes (Signed)
Ms. [name] is a 74 year old woman referred to me by Dr. Karle Starch for low back pain and consideration of trigger point injections.  Patient has had long-standing history of back pain.  Over the past 10-11 months she's been having progressive right-sided hip pain.  It affects her ability to walk.  She has difficulty arising out of a chair and getting up in the morning.  She is also having significant difficulty performing house chores including vacuuming.  When she is walking she will intermittently have right sciatic pain in the posterior thigh.  She had recent right hip films that showed no significant arthritis.  She has not had any lumbar spine films.  In the past she's had MRIs but is very claustrophobic in does not want to undergo another MRI.  Previous MRI in 06 showed some foraminal narrowing at the right L5-S1 level.  Patient states she's had cortisone injections in the past and does not want these either.    Patient denies any weakness.  She recently started physical therapy but is having difficulty performing some of the exercises including bridging.    On exam patient has slight give way weakness in the right hip flexors otherwise strength is intact.  Seated straight leg raise is negative.  Hip range of motion is within normal limits.  Reflexes are 2+ and symmetric.  Ambulation is antalgic favoring the right lower extremity.  She has some nonspecific tenderness to palpation in bilateral lumbar paraspinals.    Impression/plan: Patient with chronic axial lower back pain and 10 month history of right hip pain and intermittent sciatic pain suggestive of L5 radiculopathy in the setting of prior foraminal stenosis seen on MRI in 2006.  Although she has some probable secondary myofascial pain I feel her main issue is stenosis and nerve pain.  I have suggested a trial of gabapentin.  I have revised PT to focus on flexion biased lumbar stabilization exercises.    I did perform to trigger point injections today into  the lumbar paraspinals.    Patient will follow-up with me in 4-6 weeks.    Procedure:Risks, benefits and alternatives to injection were explained. After informed verbal consent and verbal and visual confirmation of the injection site my initials were placed and a time out was performed at   2:30 PM    . Timeout participants were myself and the patient . Two patient identifier's were used. The correct procedure and site were confirmed.  Using a 25G needle, one cc of 0.25% Marcaine was injected under sterile conditions with appropriate hand hygeine and alcohol-based prep into the site of maximal tenderness. No complications.

## 2014-06-24 ENCOUNTER — Ambulatory Visit: Payer: Self-pay | Admitting: Rehabilitative and Restorative Service Providers"

## 2014-06-24 DIAGNOSIS — M545 Low back pain, unspecified: Secondary | ICD-10-CM

## 2014-06-24 NOTE — Progress Notes (Signed)
SPORTS PT CHARGE:   Therapeutic Procedures:  PT CODE 314 - CPT CODE PT - 97110 - Therapeutic Exercise (ea 15 min) x 3 units  Total Minute Time: 45 min  Status/POC:

## 2014-06-24 NOTE — Progress Notes (Signed)
[facility]  PT Note    [name]   [id number]     Diagnosis: Low back and right groin pain    Subjective:  Pain Score:  6  Pain: [name] reports that she got injections, but did not notice any change. HEP has been going ok, and she got new exercises from Dr. Jess Barters    Objective:  Strength - Ther Ex per flowsheet  Function: - NA  Education:  Updated HEP, Verbal cues for ther ex, Manual cues for ther ex    Objective         Ab iso x10   Glute iso x10       Clamshell x10   Curl-up 5"x10   Bridge Hold   Unilateral bridge/roll x10 B       Sit-to-stand x10 bolster at knees   Stair climbing 5 min 6" ascend and descend   Step up x10 6"                   Supine HS stretcfh 3x30"       Bike 5 min       Treatment:  Ther Exercise per flowsheet    Assessment:   Cuing with stair climbing to decrease genu valgum bilaterally. Step up and down added to HEP, in addition to supine HS stretch (to replace seated HS stretch from Dr. Jess Barters). Bolster at knees to replace band at knees, as this improved core stability in functional task and decreased pain. Cuing with unilateral bridge/roll for hip EXT instead of lumbar EXT.        Plan of Care:  Continue per Plan of care -  As written, following up in 1 month due to patient expense.    Thank you for referring this patient to [facility], PT

## 2014-07-29 ENCOUNTER — Other Ambulatory Visit: Payer: Self-pay | Admitting: Primary Care

## 2014-07-29 ENCOUNTER — Telehealth: Payer: Self-pay | Admitting: Primary Care

## 2014-07-29 ENCOUNTER — Ambulatory Visit: Admit: 2014-07-29 | Discharge: 2014-07-29 | Disposition: A | Discharge status: Home / Self Care | Payer: Self-pay

## 2014-07-29 DIAGNOSIS — I714 Abdominal aortic aneurysm, without rupture, unspecified: Secondary | ICD-10-CM

## 2014-07-29 LAB — CV DOPPLER AORTA COMPLETE
Aorta Diameter A-P Distal: 3.21 cm
Aorta Diameter A-P Mid: 4.17 cm
Aorta Diameter A-P Prox: 3.54 cm
Aorta Diameter Trans Distal: 3.62 cm
Aorta Diameter Trans Mid: 4.5 cm
Aorta Diameter Trans Prox: 3.05 cm
Aorta EDV Distal: -2.2 cm/s
Aorta EDV Mid: 7.2 cm/s
Aorta PSV Distal: -11.1 cm/s
Aorta PSV Mid: 32.3 cm/s
Left Common Femoral Artery Prox PSV: 61 cm/s
Left Common Iliac Artery Prox AP Diameter: 1.27 cm
Left Common Iliac Artery Prox PSV: 200 cm/s
Left Common Iliac Artery Prox Trans Diameter: 1.35 cm
Right Common Femoral Artery Prox PSV: 77 cm/s
Right Common Iliac Artery Prox AP Diameter: 1.3 cm
Right Common Iliac Artery Prox PSV: 130 cm/s
Right Common Iliac Artery Prox Trans Diameter: 1.3 cm

## 2014-07-29 NOTE — Telephone Encounter (Signed)
Vascular surgery called to give finding on the pt's ultrasound. Please call [name] back to get results.     Thanks, Konrad Penta

## 2014-07-29 NOTE — Telephone Encounter (Signed)
Referral is in.

## 2014-07-29 NOTE — Telephone Encounter (Signed)
Spoke with [name] who reported that pt had repeat ultrasound done today for AAA and it has gotten larger, now measuring at 4.5 cm X 4.17 cm. Per [name] pt needs to be referred to a vascular surgeon and seen within 3-5 weeks. They cannot do a self referral so this will need to be set up on our end. I will ask Dr Campbell Stall if she is willing to do this as pt was with Dr Ruthy Dick previously.

## 2014-07-30 ENCOUNTER — Ambulatory Visit: Payer: Self-pay | Admitting: Physical Medicine and Rehabilitation

## 2014-08-03 ENCOUNTER — Ambulatory Visit: Payer: Self-pay | Admitting: Rehabilitative and Restorative Service Providers"

## 2014-08-27 ENCOUNTER — Ambulatory Visit: Payer: Self-pay | Admitting: Vascular Surgery

## 2014-08-27 ENCOUNTER — Encounter: Payer: Self-pay | Admitting: Vascular Surgery

## 2014-08-27 VITALS — BP 140/70 | Ht 67.0 in | Wt 138.0 lb

## 2014-08-27 DIAGNOSIS — I714 Abdominal aortic aneurysm, without rupture, unspecified: Secondary | ICD-10-CM

## 2014-08-27 NOTE — Patient Instructions (Signed)
·   Please seek immediate medical attention for any severe, unexplained abdominal or back pain.  · Return to clinic in 6 months.  · Phone clinic with any other questions or concerns in the interim.

## 2014-08-27 NOTE — Progress Notes (Signed)
[facility]    Chief Complaint:   Abdominal aortic aneurysm    HPI:   [name] is a 74 y.o. female with PMH significant for HLD, chronic back pain, GERD, and smoking history: quit 2015, who is referred to the Vascular Surgery outpatient clinic for evaluation and management of previously identified abdominal aortic aneurysm. Family history is negative for aneurysmal disease. Patient is accompanied to today's office visit by her sister.    [name] reports identification of aortic aneurysm CT imaging completed for other reason in 2012, had previously been followed with ultrasound imaging in [place]. Patient moved to [place] one year ago. She denies any abdominal or back pain of unclear etiology. She notes recent abdominal discomforts after eating, relieved with bowel movement. Denies anginal post-prandial discomforts or unintentional weight loss. She denies any symptoms of vascular claudication, rest pain, or history of non-healing ulceration. Does describe some radicular pain of the left leg which limits walking to some degree.     Patient's medications, allergies, and medical, surgical, family, and social histories were updated, as appropriate, in eRecord, during today's office visit.     Vascular risk factors:   Age: 74 y.o.  Female gender: no  Diabetes mellitus: no  Obesity: no, Body mass index is 21.61 kg/(m^2).  Hypertension: no  Hyperlipidemia: yes  Tobacco abuse: yes, quit 1.5 years ago  Family history: no  VQI Info:  Occupation: Unknown  Ambulatory status: Independent  Current living status: Home  HCP: None  Code status: Full Comorbidities:  Coronary artery disease: no  Renal disease: no  Lung disease: no  Arrhythmia: no  Prior CVA: no  CHF: no  Prior PCI: no  Prior open heart surgery: no  Liver disease: no  Coagulopathy: no     REVIEW OF SYSTEMS  ROS - Pertinent items noted in HPI.    MEDICAL HISTORY  Past Medical History   Diagnosis Date    AAA (abdominal aortic aneurysm)     Elevated LFTs       Unclear if this was related to statin, gallstones, alcohol consumption     GERD (gastroesophageal reflux disease)     HLD (hyperlipidemia)     Hx of colonic polyps      Colonscopy 08/11/10; repeat 2017 per GI     Lumbar disc herniation 2006     L5-S1    Osteoporosis     Spondylosis     Urinary retention        SURGICAL HISTORY  Past Surgical History   Procedure Laterality Date    Tubal ligation      Hysterectomy       left ovaries in place        FAMILY HISTORY  Family History   Problem Relation Age of Onset    Dementia Mother 40    Cancer Mother      lung    High cholesterol Mother     Stroke Mother     Parkinsonism Father     Heart disease Father 67     passed    High cholesterol Father     Heart attack Father     Cancer Sister      Breast; in recovery    High cholesterol Sister     Cancer Brother      prostate    High cholesterol Brother     Diabetes Brother     Aneurysm Neg Hx  SOCIAL HISTORY   reports that she quit smoking about 14 months ago. She has a 30.00 pack-year smoking history. She does not have any smokeless tobacco history on file. She reports that she drinks about 5.4 oz of alcohol per week  She reports that she does not use illicit drugs.     ALLERGIES  Lipitor [atorvastatin]     MEDICATIONS  Current Outpatient Prescriptions   Medication Sig    gabapentin (NEURONTIN) 300 MG capsule Take 1 capsule (300 mg total) by mouth nightly    alendronate (FOSAMAX) 70 MG tablet Take 1 tablet (70 mg total) by mouth every 7 days   Take with a full glass of water. Do not eat or lie down for 30 min.    simvastatin (ZOCOR) 20 MG tablet Take 1 tablet (20 mg total) by mouth daily (with dinner)    omeprazole (PRILOSEC) 20 MG capsule Take 1 capsule (20 mg total) by mouth 2 times daily    Multiple Vitamins-Minerals (MULTIVITAMIN & MINERAL PO) Take 1 tablet by mouth daily    aspirin 81 MG tablet Take 81 mg by mouth daily    calcium-vitamin D (OSCAL-500) 500-200 MG-UNIT per tablet Take 2  tablets by mouth 2 times daily     No current facility-administered medications for this visit.         Objective:      BP 140/70 Comment: left arm  Ht 1.702 m (5\' 7" )  Wt 62.6 kg (138 lb)  BMI 21.61 kg/m2     IMAGING:  AAA 4.5 cm at max diameter.    Report from [facility] 06/2013 reviewed - 3.8 x 3.5 cm.    Physical Exam:      General appearance: healthy, alert, active and no distress  HEENT: Normocephalic, atraumatic  CV: regular rate and rhythm  Lungs: CTA bilaterally, respirations unlabored  Abd: soft, non-tender. Aneurysm palpable left of umbilicus, non-tender.  Extremities: Bilateral lower extremities are warm and well perfused without any evidence of clubbing, cyanosis, or limb ischemia. No edema.   R femoral pulse 2+  R popliteal pulse 2+  R DP 2+  R PT 1+  Cap refill < 3 seconds  Motor/sensory intact    L femoral pulse 2+  L popliteal pulse 2+  L DP 2+  L PT 1+  Cap refill < 3 seconds  Motor/sensory intact    Neuro: normal without focal findings, mental status, speech normal, alert and oriented x3 and sensation grossly normal    Assessment:   [name] is a 74 y.o. female with known AAA, now 4.5cm.    Plan:      The results of the studies were discussed with the patient - mild interval growth, now 4.5cm at maximum dimension.   Recommend continued surveillance of the abdominal aorta; no surgical intervention is indicated at this time.    Indications for surgical intervention were reviewed: size > 5 cm or growth > 5 mm over 6 months.   Discussed imaging as screening for first degree relatives.   Risk factor modification reviewed: daily antiplatelet therapy, adequate control of blood pressure and cholesterol, and continued abstinence from tobacco products.   RTC 6 months - abdominal duplex followed by office visit.   Seek immediate medical attention with any severe, unexplained abdominal or back pain.   Phone clinic with any questions/concerns prior to next visit.    Diane Heap, NP  Vascular  Surgery APP  08/27/2014 at 6:39 PM

## 2014-09-17 ENCOUNTER — Other Ambulatory Visit: Payer: Self-pay | Admitting: Primary Care

## 2014-09-17 ENCOUNTER — Other Ambulatory Visit: Payer: Self-pay | Admitting: Physical Medicine and Rehabilitation

## 2014-09-17 DIAGNOSIS — E785 Hyperlipidemia, unspecified: Secondary | ICD-10-CM

## 2014-09-20 NOTE — Telephone Encounter (Signed)
Last filled 06.08.2016

## 2014-10-12 ENCOUNTER — Telehealth: Payer: Self-pay | Admitting: Primary Care

## 2014-10-12 NOTE — Telephone Encounter (Signed)
Patient called to ask if she will need a pneumonia this year?  Please call patient and advise.  Thank you

## 2014-10-12 NOTE — Telephone Encounter (Signed)
Patient aware that she does need a Pneumo injection but can wait until her appt on 12/06/2014

## 2014-10-22 ENCOUNTER — Other Ambulatory Visit: Payer: Self-pay | Admitting: Primary Care

## 2014-10-22 MED ORDER — GABAPENTIN 300 MG PO CAPSULE *I*
300.0000 mg | ORAL_CAPSULE | Freq: Every evening | ORAL | 1 refills | Status: DC
Start: 2014-10-22 — End: 2014-12-06

## 2014-10-22 NOTE — Telephone Encounter (Signed)
Last visit 04/22/2014. Has an appointment scheduled 12/06/2014

## 2014-10-22 NOTE — Telephone Encounter (Signed)
Patient is requesting a medication refill. Please Advise    GABAPENTIN 300 MG capsule    Thank you  [name]

## 2014-10-28 ENCOUNTER — Ambulatory Visit: Payer: Self-pay | Admitting: Primary Care

## 2014-11-08 ENCOUNTER — Encounter: Payer: Self-pay | Admitting: Rehabilitative and Restorative Service Providers"

## 2014-11-08 DIAGNOSIS — M545 Low back pain, unspecified: Secondary | ICD-10-CM

## 2014-11-08 NOTE — Progress Notes (Signed)
Sports and Spine Rehabilitation  Discharge Summary      [name]  [id number]  11/08/2014    Diagnosis: Low back pain    SUMMARY OF TREATMENTS:  Received care for 2 rehabilitation visits    Attendance:  Good    Compliance:  Good     The treatment(s) included:  Home program instruction, Therapeutic exercise (ROM/flexibility/strength), Active spine protocol    Treatment Goals:  Unknown  Range of Motion/Flexibility:  Unknown  Strength/Motor Performance:  Unknown  Functional Recovery:  Unknown  Pain Control:  Unknown  Home Program:  Achieved, inst. in HEP  Other:  NA    REASON FOR DISCHARGE:  Patient did not return for follow-up     Prognosis at time of discharge:  fair  Comments:  NA    Thank you for the referral of this patient to [facility].    Kyra Leyland, PT

## 2014-11-29 ENCOUNTER — Encounter: Payer: Self-pay | Admitting: Primary Care

## 2014-12-06 ENCOUNTER — Ambulatory Visit: Payer: Self-pay | Admitting: Primary Care

## 2014-12-06 ENCOUNTER — Encounter: Payer: Self-pay | Admitting: Primary Care

## 2014-12-06 VITALS — BP 128/72 | HR 72 | Ht 66.93 in | Wt 142.6 lb

## 2014-12-06 DIAGNOSIS — K219 Gastro-esophageal reflux disease without esophagitis: Secondary | ICD-10-CM

## 2014-12-06 DIAGNOSIS — M810 Age-related osteoporosis without current pathological fracture: Secondary | ICD-10-CM

## 2014-12-06 DIAGNOSIS — I714 Abdominal aortic aneurysm, without rupture, unspecified: Secondary | ICD-10-CM

## 2014-12-06 DIAGNOSIS — M545 Low back pain, unspecified: Secondary | ICD-10-CM

## 2014-12-06 DIAGNOSIS — E7800 Pure hypercholesterolemia, unspecified: Secondary | ICD-10-CM

## 2014-12-06 DIAGNOSIS — M5431 Sciatica, right side: Secondary | ICD-10-CM

## 2014-12-06 MED ORDER — PNEUMOCOCCAL VAC POLYVALENT 25 MCG/0.5ML INJ *WRAPPED*
0.5000 mL | INJECTION | Freq: Once | INTRAMUSCULAR | 0 refills | Status: AC
Start: 2014-12-06 — End: 2014-12-06

## 2014-12-06 MED ORDER — FAMOTIDINE 20 MG PO TABS *I*
20.0000 mg | ORAL_TABLET | Freq: Two times a day (BID) | ORAL | 1 refills | Status: DC
Start: 2014-12-06 — End: 2015-01-26

## 2014-12-06 MED ORDER — GABAPENTIN 300 MG PO CAPSULE *I*
ORAL_CAPSULE | ORAL | 1 refills | Status: DC
Start: 2014-12-06 — End: 2015-01-26

## 2014-12-06 NOTE — Progress Notes (Signed)
Follow up Visit    [name] is 74 y.o. female presenting for a follow up visit at [place].     Patient previously cared for by Dr. Ruthy Dick in this office, who is not longer seeing patients here.  Prior to [facility] her PCP was:    She has AAA (abdominal aortic aneurysm); Osteoporosis; Hyperlipidemia; GERD (gastroesophageal reflux disease); Back pain; Gallstones; Benign neoplasm of colon; and Abdominal aortic aneurysm (AAA) without rupture on her problem list.      Waking Up at night.  R low back to outside of the last  Herniated discs  If bends over to make the bed is in agony  Sister bought a pulsating device.     If sits down the pain goes.   MRI- 2005 and again, and severe claustriphobia. Went with open MRI.   Still had bad experience.   Doesn't want to repeat.     Quit smoking 1 1/2 yrs ago.  Gained 25 lbs.  Lived with ex- husband  Married and 2 Germans and divorced 2 Germans.  Simvastatin 40 in [place].   Changed to atorva. Then called and said abn liver function, and also gall stone.   Thought atorva.  Primary didn't not think due to chol med, GI did.   Fam hx of high Chol   Dr Gwendel Hanson [place].   Dizziness brought on by looking up  Or getting up from a chair.     Dizzy spells.   Lives alone.   2 story town home.   Son bought and she is renting it.   2 sons:   1. Paul- 51. [place]. Corporate job at [employer].  2. Daniel- 50. [place] retired Company secretary.     PAST MEDICAL HISTORY:   Chronic Medical Illnesses: See below.  Hospitalizations:   Surgeries-   Meds- see below.  Allergies- see below.     PROBL:  Patient Active Problem List    Diagnosis Date Noted    Abdominal aortic aneurysm (AAA) without rupture 08/27/2014    Benign neoplasm of colon 11/03/2013     Removed via colonscopy 2012; told to repeat 2017       Gallstones 10/23/2013     Noted U/S: 06/02/13: There is gallbladder sludge/crushed gallstones. No gallbladder wall thickening, no sonographic Murphy's sign. Common  bile duct 66mm.       AAA (abdominal aortic aneurysm) 10/15/2013     Noted 06/02/13: 3.8X3.5 cm partially thrombosed  08/2014: 4.5cm, continued surveillance with vascular surgery      Osteoporosis 10/15/2013     Intolerant to fosamax. Taking calcium and vitamin D       Hyperlipidemia 10/15/2013    GERD (gastroesophageal reflux disease) 10/15/2013    Back pain 10/15/2013     CURRENTMEDS  Current Outpatient Prescriptions   Medication Sig    gabapentin (GABAPENTIN) 300 MG capsule Take 1 capsule (300 mg total) by mouth nightly    simvastatin (ZOCOR) 20 MG tablet TAKE ONE TABLET BY MOUTH EVERY DAY WITH DINNER.    alendronate (FOSAMAX) 70 MG tablet Take 1 tablet (70 mg total) by mouth every 7 days   Take with a full glass of water. Do not eat or lie down for 30 min.    omeprazole (PRILOSEC) 20 MG capsule Take 1 capsule (20 mg total) by mouth 2 times daily    Multiple Vitamins-Minerals (MULTIVITAMIN & MINERAL PO) Take 1 tablet by mouth daily    aspirin  81 MG tablet Take 81 mg by mouth daily    calcium-vitamin D (OSCAL-500) 500-200 MG-UNIT per tablet Take 2 tablets by mouth 2 times daily       Allergies   Allergen Reactions    Lipitor [Atorvastatin] Other (See Comments)     Elevated LFTs     MEDICALHX:  Past Medical History   Diagnosis Date    AAA (abdominal aortic aneurysm)     Elevated LFTs      Unclear if this was related to statin, gallstones, alcohol consumption     GERD (gastroesophageal reflux disease)     HLD (hyperlipidemia)     Hx of colonic polyps      Colonscopy 08/11/10; repeat 2017 per GI     Lumbar disc herniation 2006     L5-S1    Osteoporosis     Spondylosis     Urinary retention      SURGICALHX  Past Surgical History   Procedure Laterality Date    Tubal ligation      Hysterectomy       left ovaries in place      [facility]  Social History     Social History    Marital status: Divorced     Spouse name: N/A    Number of children: N/A    Years of education: N/A     Occupational History    Not  on file.     Social History Main Topics    Smoking status: Former Smoker     Packs/day: 1.00     Years: 30.00     Quit date: 06/15/2013    Smokeless tobacco: Not on file    Alcohol use 5.4 oz/week     9 Glasses of wine per week    Drug use: No    Sexual activity: Not Currently     Other Topics Concern    Not on file     Social History Narrative    Lives alone currently in townhome     2 brothers and 2 sisters that live here    2 boys ([place], [place])    Education officer, community      ---  SHx:  Work Status:  Marital Status/Partner Status:    - Habits:   Tobacco:  ETOH:  Drugs:    + Habits:  Exercise:   Diet/Nutrition:  ---  FAMHX  Family History   Problem Relation Age of Onset    Dementia Mother 75    Cancer Mother      lung    High cholesterol Mother     Stroke Mother     Parkinsonism Father     Heart Disease Father 87     passed    High cholesterol Father     Heart attack Father     Cancer Sister      Breast; in recovery    High cholesterol Sister     Cancer Brother      prostate    High cholesterol Brother     Diabetes Brother     Aneurysm Neg Hx      Family Hx:   Parents:  --M:  --F:  Siblings:   1.  2.  3.  Marital/Partner Status:  Children:     Review of Systems   General: No fever or chills, no significant weight loss.   HEENT: No headaches, change in vision, throat pain, hoarseness.  Pulm: No SOB/Wheezing.  Cardiac: No chest pain, palpitations, syncope  or pre-syncope, or lower extremity edema.   GI: no abdominal pain, diarrhea, constipation, melena, or blood per rectum.  GU: no dysuria, polyuria, or hematuria.       Physical Exam     Visit Vitals    BP 128/72    Pulse 72    Ht 1.7 m (5' 6.93")    Wt 64.7 kg (142 lb 9.6 oz)    BMI 22.38 kg/m2       GEN: Alert, pleasant well adult in NAD.   HEENT: Normocephalic and atraumatic. PERRL. EOMI. TMs WNL. Moist mucous membranes. No pharyngeal erythema or exudate.   NECK: Supple, no lymphadenopathy or thyromegaly or JVD.   PULM: Easy respirations,  well aerated, Clear to auscultation bilaterally.   CVS: RRR, no murmur, normal S1& S2.   ABD: Soft. Normal bowel sounds. No hepatosplenomegaly, masses, tenderness or rebound tenderness.    EXTREMITIES: No cyanosis, clubbing or edema.  SKIN: No rashes or concerning lesions.        Assessment/Plan     AAA- Vascular suregery- 08/27/14 Due 02/25/15 Cathren Harsh.   Osteoporosis-  Hyperlipidemia- simvastatin 20mg  daily.   GERD- omeprazole 20mg  BID.   Low Back pain-  MRI- 11/20/00 [facility]. L5-S1 right foraminal and far lateral disc protrusion/herniation that touches the existing right L5 nerve root.  There is moderate right foraminal and mild left foraminal stenosis.  Increase gaba to 300mg  bid . Can increase further prn.   Gallstones- asymptomatic.   Benign neoplasm of colon- repeat colonoscopy due 2017.   Fosamax 70mg  weekly.   Ca Vit D 500/200 BID  Aspirin 81mg  daily     Health Maintenance-   --Lipids: LDL 137 04/15/14.   --CRC Screening: 2012. Repeat due 2017.    Female:     Pap:     Mammo:     Immunizations:  --Tdap:   --Flu: 11/04/13, 2016 [facility] [place].   --Shingles:   --Prevnar:  10/15/2013  --Pneumovax:     Consultants:   Vascular surgery- 08/27/14 Due 02/25/15 [facility] Cory Raman.        There are no Patient Instructions on file for this visit.    Patient should return 6 wks. Multiple problems.     Pilar Plate, MD

## 2014-12-14 ENCOUNTER — Other Ambulatory Visit: Payer: Self-pay | Admitting: Primary Care

## 2014-12-14 DIAGNOSIS — M810 Age-related osteoporosis without current pathological fracture: Secondary | ICD-10-CM

## 2014-12-14 NOTE — Telephone Encounter (Signed)
LOV 12/06/2014

## 2015-01-15 ENCOUNTER — Encounter: Payer: Self-pay | Admitting: Radiology

## 2015-01-26 ENCOUNTER — Ambulatory Visit: Payer: Self-pay | Admitting: Primary Care

## 2015-01-26 ENCOUNTER — Encounter: Payer: Self-pay | Admitting: Primary Care

## 2015-01-26 VITALS — BP 114/68 | Temp 72.0°F | Ht 66.93 in | Wt 143.0 lb

## 2015-01-26 DIAGNOSIS — M810 Age-related osteoporosis without current pathological fracture: Secondary | ICD-10-CM

## 2015-01-26 DIAGNOSIS — K8020 Calculus of gallbladder without cholecystitis without obstruction: Secondary | ICD-10-CM

## 2015-01-26 DIAGNOSIS — K219 Gastro-esophageal reflux disease without esophagitis: Secondary | ICD-10-CM

## 2015-01-26 DIAGNOSIS — R142 Eructation: Secondary | ICD-10-CM

## 2015-01-26 DIAGNOSIS — E785 Hyperlipidemia, unspecified: Secondary | ICD-10-CM

## 2015-01-26 DIAGNOSIS — E7800 Pure hypercholesterolemia, unspecified: Secondary | ICD-10-CM

## 2015-01-26 MED ORDER — SIMVASTATIN 20 MG PO TABS *I*
20.0000 mg | ORAL_TABLET | Freq: Every day | ORAL | 1 refills | Status: DC
Start: 2015-01-26 — End: 2015-04-22

## 2015-01-26 MED ORDER — ASPIRIN 81 MG PO TABS *I*
81.0000 mg | ORAL_TABLET | Freq: Every day | ORAL | 1 refills | Status: AC
Start: 2015-01-26 — End: ?

## 2015-01-26 MED ORDER — OMEPRAZOLE 20 MG PO CPDR *I*
20.0000 mg | DELAYED_RELEASE_CAPSULE | Freq: Two times a day (BID) | ORAL | 1 refills | Status: DC
Start: 2015-01-26 — End: 2015-01-26

## 2015-01-26 MED ORDER — GABAPENTIN 300 MG PO CAPSULE *I*
ORAL_CAPSULE | ORAL | 1 refills | Status: DC
Start: 2015-01-26 — End: 2015-04-26

## 2015-01-26 MED ORDER — ALENDRONATE SODIUM 70 MG PO TABS *I*
70.0000 mg | ORAL_TABLET | ORAL | 1 refills | Status: DC
Start: 2015-01-26 — End: 2015-04-26

## 2015-01-26 NOTE — Progress Notes (Signed)
[name] is 75 y.o. female presenting for follow up.    Today notes:   Famotine stopped, was not helping  [name] is helping several times a day. Recent sx was belching not heartburn.     Faba 300mg  2 @ hs helps sleep.   Still at night R low back pain radiating down R leg to foot.   Osteoporosis- on foxamax. Asking how long.   Dexa showed T score -3.3 12/10/13, ordered by Dr Ruthy Dick.   Wants to hold on further testing than Lab and [place] if possible, to limit costs this year    PROBL:  Patient Active Problem List    Diagnosis Date Noted    Abdominal aortic aneurysm (AAA) without rupture 08/27/2014    Benign neoplasm of colon 11/03/2013     Removed via colonscopy 2012; told to repeat 2017       Gallstones 10/23/2013     Noted U/S: 06/02/13: There is gallbladder sludge/crushed gallstones. No gallbladder wall thickening, no sonographic Murphy's sign. Common bile duct 42mm.       AAA (abdominal aortic aneurysm) 10/15/2013     Noted 06/02/13: 3.8X3.5 cm partially thrombosed  08/2014: 4.5cm, continued surveillance with vascular surgery      Osteoporosis 10/15/2013     Intolerant to fosamax. Taking calcium and vitamin D       Hyperlipidemia 10/15/2013    GERD (gastroesophageal reflux disease) 10/15/2013    Back pain 10/15/2013     CURRENTMEDS  Current Outpatient Prescriptions   Medication Sig    alendronate (FOSAMAX) 70 MG tablet TAKE 1 TAB BY MOUTH EVERY 7 DAYS. TAKE WITH FULL GLASS OF WATER. DO NOT EAT OR LIE DOWN FOR 30 MINS    gabapentin (GABAPENTIN) 300 MG capsule 2 daily    famotidine (PEPCID) 20 MG tablet Take 1 tablet (20 mg total) by mouth 2 times daily    simvastatin (ZOCOR) 20 MG tablet TAKE ONE TABLET BY MOUTH EVERY DAY WITH DINNER.    Multiple Vitamins-Minerals (MULTIVITAMIN & MINERAL PO) Take 1 tablet by mouth daily    aspirin 81 MG tablet Take 81 mg by mouth daily    calcium-vitamin D (OSCAL-500) 500-200 MG-UNIT per tablet Take 2 tablets by mouth 2 times daily       Allergies   Allergen Reactions     Lipitor [Atorvastatin] Other (See Comments)     Elevated LFTs     MEDICALHX:  Past Medical History   Diagnosis Date    AAA (abdominal aortic aneurysm)     Elevated LFTs      Unclear if this was related to statin, gallstones, alcohol consumption     GERD (gastroesophageal reflux disease)     HLD (hyperlipidemia)     Hx of colonic polyps      Colonscopy 08/11/10; repeat 2017 per GI     Lumbar disc herniation 2006     L5-S1    Osteoporosis     Spondylosis     Urinary retention      SURGICALHX  Past Surgical History   Procedure Laterality Date    Tubal ligation      Hysterectomy       left ovaries in place      [facility]  Social History     Social History    Marital status: Divorced     Spouse name: N/A    Number of children: N/A    Years of education: N/A     Occupational History  Not on file.     Social History Main Topics    Smoking status: Former Smoker     Packs/day: 1.00     Years: 30.00     Quit date: 06/15/2013    Smokeless tobacco: Not on file    Alcohol use 5.4 oz/week     9 Glasses of wine per week    Drug use: No    Sexual activity: Not Currently     Social History Narrative    Lives alone currently in townhome     2 brothers and 2 sisters that live here    2 boys ([place], [place])    [place]  Family History   Problem Relation Age of Onset    Dementia Mother 44    Cancer Mother      lung    High cholesterol Mother     Stroke Mother     Parkinsonism Father     Heart Disease Father 72     passed    High cholesterol Father     Heart attack Father     Cancer Sister      Breast; in recovery    High cholesterol Sister     Cancer Brother      prostate    High cholesterol Brother     Diabetes Brother     Aneurysm Neg Hx        Review of Systems   General: No fever or chills, no significant weight loss.   HEENT: No headaches, change in vision, throat pain, hoarseness.  Pulm: No SOB/Wheezing.  Cardiac: No chest pain, palpitations, syncope or pre-syncope, or  lower extremity edema.   GI: no abdominal pain, diarrhea, constipation, melena, or blood per rectum.  GU: no dysuria, polyuria, or hematuria.         Physical Exam     Visit Vitals    BP 114/68    Temp (!) 22.2 C (72 F)    Ht 1.7 m (5' 6.93")    Wt 64.9 kg (143 lb)    BMI 22.44 kg/m2       GEN: Alert, pleasant well adult in NAD.   HEENT: Normocephalic and atraumatic. PERRL. EOMI. TMs WNL. Moist mucous membranes. No pharyngeal erythema or exudate.   NECK: Supple, no lymphadenopathy or thyromegaly or JVD.   PULM: Easy respirations, well aerated, Clear to auscultation bilaterally.   CVS: RRR, no murmur, normal S1& S2.   ABD: Soft. Normal bowel sounds. No hepatosplenomegaly, masses, tenderness.  No rebound tenderness.    EXTREMITIES: No cyanosis, clubbing or edema.  SKIN: No rashes or concerning lesions.        Assessment/Plan     AAA- Vascular suregery- 08/27/14 Due 02/25/15 Cathren Harsh.   Osteoporosis- Dexa showed T score -3.3 12/10/13, ordered by Dr Ruthy Dick.   Fosamax 70mg  weekly.   Ca Vit D 500/200 BID  Check Vit D.   Hyperlipidemia- simvastatin 20mg  daily. Past 40. Side effects with Atrorva and stopped.   Recheck FLP before next visit.   GERD- omeprazole 20mg  BID. Off. No further heartburn.  Belching- Gavascon PRN.  helps.   Low Back pain- R sided sciatica.  MRI- 11/20/00 [facility]. L5-S1 right foraminal and far lateral disc protrusion/herniation that touches the existing right L5 nerve root. There is moderate right foraminal and mild left foraminal stenosis.  01/26/15: Increase gaba 300 mg,  to 1 in the AM, 2 at bedtime. 300mg   bid .     Gallstones- asymptomatic.   Benign neoplasm of colon- repeat colonoscopy due 2017.   Aspirin 81mg  daily   Health Maintenance-   --Lipids: LDL 137 04/15/14.   --CRC Screening: 2012. Repeat due 2017.    Female:   Pap:   Mammo: 12/10/13.     Immunizations:  --Tdap:    --Flu: 11/04/13, 2016 [facility].   --Shingles:   --Prevnar: 10/15/2013  --Pneumovax:  12/10/2014  [facility] (changing). [facility] [place]     Consultants:   Vascular surgery- 08/27/14 Due 02/25/15 Cathren Harsh.              There are no Patient Instructions on file for this visit.    Patient should return 6 m.       Pilar Plate, MD

## 2015-02-25 ENCOUNTER — Ambulatory Visit: Admit: 2015-02-25 | Discharge: 2015-02-25 | Disposition: A | Discharge status: Home / Self Care | Payer: Self-pay

## 2015-02-25 ENCOUNTER — Ambulatory Visit
Admission: RE | Admit: 2015-02-25 | Discharge: 2015-02-25 | Disposition: A | Discharge status: Home / Self Care | Payer: Self-pay | Source: Ambulatory Visit | Attending: Vascular Surgery | Admitting: Vascular Surgery

## 2015-02-25 ENCOUNTER — Encounter: Payer: Self-pay | Admitting: Surgery

## 2015-02-25 ENCOUNTER — Ambulatory Visit: Payer: Self-pay | Admitting: Surgery

## 2015-02-25 VITALS — BP 124/70 | Ht 70.0 in | Wt 143.0 lb

## 2015-02-25 DIAGNOSIS — I714 Abdominal aortic aneurysm, without rupture, unspecified: Secondary | ICD-10-CM

## 2015-02-25 NOTE — Progress Notes (Signed)
HPI:   [name] is a 75 y.o. female with history of known Aortic Abdominal Aneurysm [facility] for routine follow-up and surveillance. PMH significant for HLD, chronic back pain, Sciatica, herniated discs, GERD. Smoking history: former smoker, quit in 2015. No FHx significant for aneurysmal disaese. Patient is accompanied to today's office visit by her brother.    Mamta denies unexplained abdominal or back pain. She denies post-prandial abdominal discomforts as well as signs concerning of stroke/TIA including amaurosis fugax. She does note intermittent dizziness with positional changes (ie sitting, standing), neck extension and occasionally UE arm elevation. She denies any numbness/tingling, discoloration or pain to bilateral hands. She denies LE claudication symptoms, rest pain, or tissue loss bilaterally. She does reports chronic back pain and radicular discomforts which affect her mobility. She notes improvement in LE discomforts with hunching over or using a cart to lean against. Gabapentin has helped with pain and sleeping through the night.       Vascular risk factors:   Age: 75 y.o.  Female gender: no  Diabetes mellitus: no  Obesity: no, Body mass index is 20.52 kg/(m^2).  Hypertension: no  Hyperlipidemia: yes  Tobacco abuse: yes, quit 1.5 years ago  Family history: no  VQI Info:  Ambulatory status: Independent  Current living status: Home  HCP: None  Code status: Full Comorbidities:  Coronary artery disease: no  Renal disease: no  Lung disease: no  Arrhythmia: no  Prior CVA: no  CHF: no  Prior PCI: no  Prior open heart surgery: no  Liver disease: no  Coagulopathy: no     REVIEW OF SYSTEMS  ROS - Notable ROS per HPI above.     MEDICAL HISTORY  Past Medical History:   Diagnosis Date    AAA (abdominal aortic aneurysm)     Elevated LFTs     Unclear if this was related to statin, gallstones, alcohol consumption     GERD (gastroesophageal reflux disease)     HLD  (hyperlipidemia)     Hx of colonic polyps     Colonscopy 08/11/10; repeat 2017 per GI     Lumbar disc herniation 2006    L5-S1    Osteoporosis     Spondylosis     Urinary retention        SURGICAL HISTORY  Past Surgical History:   Procedure Laterality Date    HYSTERECTOMY      left ovaries in place     TUBAL LIGATION         FAMILY HISTORY  Family History   Problem Relation Age of Onset    Dementia Mother 23    Cancer Mother      lung    High cholesterol Mother     Stroke Mother     Parkinsonism Father     Heart Disease Father 90     passed    High cholesterol Father     Heart attack Father     Cancer Sister      Breast; in recovery    High cholesterol Sister     Cancer Brother      prostate    High cholesterol Brother     Diabetes Brother     Aneurysm Neg Hx         SOCIAL HISTORY   reports that she quit smoking about 20 months ago. She has a 30.00 pack-year smoking history. She does not have any smokeless tobacco history on file. She  reports that she drinks about 5.4 oz of alcohol per week  She reports that she does not use illicit drugs.     ALLERGIES  Lipitor [atorvastatin]     MEDICATIONS  Current Outpatient Prescriptions   Medication Sig    gabapentin (GABAPENTIN) 300 MG capsule 1 in the AM, 2 at bedtime.    alendronate (FOSAMAX) 70 MG tablet Take 1 tablet (70 mg total) by mouth every 7 days   Take with a full glass of water. Do not eat or lie down for 30 min.    aspirin 81 MG tablet Take 1 tablet (81 mg total) by mouth daily    simvastatin (ZOCOR) 20 MG tablet Take 1 tablet (20 mg total) by mouth daily (with dinner)    Multiple Vitamins-Minerals (MULTIVITAMIN & MINERAL PO) Take 1 tablet by mouth daily    calcium-vitamin D (OSCAL-500) 500-200 MG-UNIT per tablet Take 2 tablets by mouth 2 times daily     No current facility-administered medications for this visit.         Objective:      BP 124/70 Comment: right rm  Ht 1.778 m (5\' 10" )  Wt 64.9 kg (143 lb)  BMI 20.52 kg/m2      IMAGING:        Abdominal Aorta  Mid Infrarenal Aorta:  Plaque: mural thrombus       Vascular Measurements   Iliac/Femoral Right   Right Common Iliac Artery Prox PSV 121.45 cm/s      Right Common Iliac Artery Prox EDV 0.00 cm/s      Right Common Iliac Artery Prox AP Diameter 0.69 cm      Right Common Iliac Artery Prox Trans Diameter 0.72 cm       Iliac/Femoral Left   Left Common Iliac Artery Prox PSV 153.84 cm/s      Left Common Iliac Artery Prox EDV 8.10 cm/s      Left Common Iliac Artery Prox AP Diameter 0.80 cm      Left Common Iliac Artery Prox Trans Diameter 0.81 cm         Aorta Measurements   Aorta   Aorta PSV Prox 44.91 cm/s      Aorta EDV Prox 0.00 cm/s      Aorta Diameter A-P Prox 3.49 cm      Aorta Diameter Trans Prox 3.16 cm      Aorta PSV Mid 20.33 cm/s      Aorta EDV Mid 0.00 cm/s      Aorta Diameter A-P Mid 4.35 cm      Aorta Diameter Trans Mid 4.57 cm      Aorta PSV Distal 28.84 cm/s      Aorta EDV Distal 0.00 cm/s      Aorta Diameter A-P Distal 2.19 cm      Aorta Diameter Trans Distal 2.36 cm           Physical Exam:      General appearance: healthy, alert, active and no distress  HEENT: Normocephalic, atraumatic, No Carotid bruit noted bilaterally   CV: regular rate and rhythm  Lungs: Clear BL, non-labored breathing   Abd: soft, non-tender. Aneurysm palpable left of umbilicus, non-tender.  Extremities: Bilateral lower extremities are warm and well perfused without any evidence of clubbing, cyanosis, or limb ischemia. No edema.   Pulses:  Right Radial: Normal  Left Radial: Normal  Right Ulnar: Normal  Left Ulnar: Normal  Right Femoral: Normal  Left Femoral: Normal  Left Femoral: Normal  Right Dorsalis Pedis: Normal  Left Dorsalis Pedis: Normal  Right Posterior Tibial: Normal  Left Posterior Tibial: Normal  Neuro: normal without focal findings, mental status, speech normal, alert and oriented x3 and sensation grossly normal    Assessment:   [name] is a 75 y.o. female with known AAA, stable  follow-up, measuring 4.3 cm today.     Plan:      Discussed results of ultrasound with patient-stable AAA, normal LE perfusion    No indication for surgical intervention at this time   Discussed indications: rapid growth of aneurysm (0.5 cm in 6 months, 1.0 cm in a year)   Recommend risk factor modification with daily antiplatelet therapy, adequate control of blood pressure and cholesterol, and continued abstinence from tobacco products   With smoking history and questionable symptoms provoked with arm elevation- recommend Carotid ultrasound screening   Patient will discuss further with her PCP   Follow-up in 6 months with repeat AAA as well as LEA ultrasound to r/o popliteal artery aneurysms    Discussed importance of seeking prompt medical attention with any severe, unexplained abdominal or back pain   Advised patient to call with nay questions or concerns       Cyril Mourning, PA  02/25/2015 at 11:08 AM

## 2015-03-01 LAB — CV DOPPLER AORTA COMPLETE
Aorta Diameter A-P Distal: 2.19 cm
Aorta Diameter A-P Mid: 4.35 cm
Aorta Diameter A-P Prox: 3.49 cm
Aorta Diameter Trans Distal: 2.36 cm
Aorta Diameter Trans Mid: 4.57 cm
Aorta Diameter Trans Prox: 3.16 cm
Aorta EDV Distal: 0 cm/s
Aorta EDV Mid: 0 cm/s
Aorta EDV Prox: 0 cm/s
Aorta PSV Distal: 28.84 cm/s
Aorta PSV Mid: 20.33 cm/s
Aorta PSV Prox: 44.91 cm/s
Left Common Iliac Artery Prox AP Diameter: 0.8 cm
Left Common Iliac Artery Prox EDV: 8.1 cm/s
Left Common Iliac Artery Prox PSV: 153.84 cm/s
Left Common Iliac Artery Prox Trans Diameter: 0.81 cm
Right Common Iliac Artery Prox AP Diameter: 0.69 cm
Right Common Iliac Artery Prox EDV: 0 cm/s
Right Common Iliac Artery Prox PSV: 121.45 cm/s
Right Common Iliac Artery Prox Trans Diameter: 0.72 cm

## 2015-03-01 LAB — CV ANKLE BRACHIAL INDEX
Left Brachial Index: 0.89 ratio
Left Brachial Pressure: 133 mmHg
Left Dorsalis Pedis Index: 1.01 ratio
Left Dorsalis Pedis Pressure: 150 mmHg
Left Posterior Tibial Index: 0.99 ratio
Left Posterior Tibial Pressure: 147 mmHg
Right Brachial Index: 1 ratio
Right Brachial Pressure: 149 mmHg
Right Dorsalis Pedis Index: 0.97 ratio
Right Dorsalis Pedis Pressure: 144 mmHg
Right Posterior Tibial Index: 0.99 ratio
Right Posterior Tibial Pressure: 148 mmHg

## 2015-04-22 ENCOUNTER — Other Ambulatory Visit: Payer: Self-pay | Admitting: Primary Care

## 2015-04-22 DIAGNOSIS — E785 Hyperlipidemia, unspecified: Secondary | ICD-10-CM

## 2015-04-22 MED ORDER — SIMVASTATIN 20 MG PO TABS *I*
20.0000 mg | ORAL_TABLET | Freq: Every day | ORAL | 1 refills | Status: DC
Start: 2015-04-22 — End: 2015-07-08

## 2015-04-22 NOTE — Telephone Encounter (Signed)
LOV 12/06/15

## 2015-04-26 ENCOUNTER — Other Ambulatory Visit: Payer: Self-pay | Admitting: Primary Care

## 2015-04-26 DIAGNOSIS — M810 Age-related osteoporosis without current pathological fracture: Secondary | ICD-10-CM

## 2015-04-26 MED ORDER — ALENDRONATE SODIUM 70 MG PO TABS *I*
70.0000 mg | ORAL_TABLET | ORAL | 1 refills | Status: DC
Start: 2015-04-26 — End: 2015-07-08

## 2015-04-26 MED ORDER — GABAPENTIN 300 MG PO CAPSULE *I*
ORAL_CAPSULE | ORAL | 1 refills | Status: DC
Start: 2015-04-26 — End: 2015-07-08

## 2015-04-26 NOTE — Telephone Encounter (Signed)
LOV  01/26/2015

## 2015-07-08 ENCOUNTER — Other Ambulatory Visit: Payer: Self-pay | Admitting: Primary Care

## 2015-07-08 DIAGNOSIS — M549 Dorsalgia, unspecified: Secondary | ICD-10-CM

## 2015-07-08 DIAGNOSIS — M810 Age-related osteoporosis without current pathological fracture: Secondary | ICD-10-CM

## 2015-07-08 DIAGNOSIS — E785 Hyperlipidemia, unspecified: Secondary | ICD-10-CM

## 2015-07-08 MED ORDER — ALENDRONATE SODIUM 70 MG PO TABS *I*
70.0000 mg | ORAL_TABLET | ORAL | 1 refills | Status: DC
Start: 2015-07-08 — End: 2015-12-22

## 2015-07-08 MED ORDER — GABAPENTIN 300 MG PO CAPSULE *I*
ORAL_CAPSULE | ORAL | 1 refills | Status: DC
Start: 2015-07-08 — End: 2015-12-13

## 2015-07-08 MED ORDER — SIMVASTATIN 20 MG PO TABS *I*
20.0000 mg | ORAL_TABLET | Freq: Every day | ORAL | 1 refills | Status: DC
Start: 2015-07-08 — End: 2015-08-10

## 2015-07-20 ENCOUNTER — Other Ambulatory Visit
Admission: RE | Admit: 2015-07-20 | Discharge: 2015-07-20 | Disposition: A | Discharge status: Home / Self Care | Payer: Self-pay | Source: Ambulatory Visit | Attending: Primary Care | Admitting: Primary Care

## 2015-07-20 DIAGNOSIS — E7800 Pure hypercholesterolemia, unspecified: Secondary | ICD-10-CM

## 2015-07-20 DIAGNOSIS — M810 Age-related osteoporosis without current pathological fracture: Secondary | ICD-10-CM

## 2015-07-20 LAB — LIPID PANEL
Chol/HDL Ratio: 2.9
Cholesterol: 222 mg/dL — AB
HDL: 77 mg/dL
LDL Calculated: 127 mg/dL
Non HDL Cholesterol: 145 mg/dL
Triglycerides: 92 mg/dL

## 2015-07-20 LAB — HEMOGLOBIN A1C: Hemoglobin A1C: 5.5 % (ref 4.0–6.0)

## 2015-07-20 LAB — ALT: ALT: 17 U/L (ref 0–35)

## 2015-07-21 ENCOUNTER — Ambulatory Visit: Payer: Self-pay | Admitting: Primary Care

## 2015-07-21 ENCOUNTER — Encounter: Payer: Self-pay | Admitting: Primary Care

## 2015-07-25 LAB — VITAMIN D
25-OH VIT D2: <4 ng/mL
25-OH VIT D3: 51 ng/mL
25-OH Vit Total: 51 ng/mL (ref 30–60)

## 2015-08-10 ENCOUNTER — Encounter: Payer: Self-pay | Admitting: Primary Care

## 2015-08-10 ENCOUNTER — Ambulatory Visit: Payer: Self-pay | Admitting: Primary Care

## 2015-08-10 VITALS — BP 122/74 | HR 80 | Ht 63.5 in | Wt 140.0 lb

## 2015-08-10 DIAGNOSIS — M79675 Pain in left toe(s): Secondary | ICD-10-CM

## 2015-08-10 DIAGNOSIS — E7800 Pure hypercholesterolemia, unspecified: Secondary | ICD-10-CM

## 2015-08-10 MED ORDER — SIMVASTATIN 40 MG PO TABS *I*
40.0000 mg | ORAL_TABLET | Freq: Every day | ORAL | 1 refills | Status: DC
Start: 2015-08-10 — End: 2015-12-22

## 2015-08-10 NOTE — Patient Instructions (Addendum)
Fosamax:  may consider drug holiday if stable after treatment x 5 years (low-mod risk pt) or x 6-10 yr (high risk pt);   ---  Lab before next visit.

## 2015-08-10 NOTE — Progress Notes (Signed)
[name] is 75 y.o. female presenting for follow up.    Today notes:     injured left second toe before mother's day. ~ 3 mlnths ago.   Rollers on bed frame.   Still sometimes throbs at night.   Going up stairs sometimes hurts.   ----  Exercises intensivesly. GYM  [place] [facility]  ---  R shoulder and upper arm pain at night  R hip and upper leg pain at night.   ---  With exercise gets pain, Knees down.   Gaba 2 at night. Sleeping soundly.   On in the morning.   Hasn't helped.   Rheum wanted to do mri lumbar spine.   Can't walkl in the morning  Making the bed painful.   Mid lumbar  R side Shoulder to right side.   Dones't have a lot of money., can't afford expenses.   Aneurysm [place]   Stopped.     Was on Simva 40  atorva gave elevated.  LFTs  simva 20. Wondering about going back up to 40 mg.   ---  PROBL:  Patient Active Problem List    Diagnosis Date Noted    Abdominal aortic aneurysm (AAA) without rupture 08/27/2014    Benign neoplasm of colon 11/03/2013     Removed via colonscopy 2012; told to repeat 2017       Gallstones 10/23/2013     Noted U/S: 06/02/13: There is gallbladder sludge/crushed gallstones. No gallbladder wall thickening, no sonographic Murphy's sign. Common bile duct 90mm.       AAA (abdominal aortic aneurysm) 10/15/2013     Noted 06/02/13: 3.8X3.5 cm partially thrombosed  08/2014: 4.5cm, continued surveillance with vascular surgery      Osteoporosis 10/15/2013     Intolerant to fosamax. Taking calcium and vitamin D. Now tolerating fosamax.      Hyperlipidemia 10/15/2013    GERD (gastroesophageal reflux disease) 10/15/2013    Back pain 10/15/2013     CURRENTMEDS  Current Outpatient Prescriptions   Medication Sig    Magnesium 250 MG TABS Take 250 mg by mouth daily    gabapentin (GABAPENTIN) 300 MG capsule 1 in the AM, 2 at bedtime.    alendronate (FOSAMAX) 70 MG tablet Take 1 tablet (70 mg total) by mouth every 7 days   Take with a full glass of water. Do not eat or lie down for 30 min.     aspirin 81 MG tablet Take 1 tablet (81 mg total) by mouth daily    calcium-vitamin D (OSCAL-500) 500-200 MG-UNIT per tablet Take 2 tablets by mouth 2 times daily    simvastatin (ZOCOR) 40 MG tablet Take 1 tablet (40 mg total) by mouth daily (with dinner)       Allergies   Allergen Reactions    Lipitor [Atorvastatin] Other (See Comments)     Elevated LFTs     MEDICALHX:  Past Medical History:   Diagnosis Date    AAA (abdominal aortic aneurysm)     Elevated LFTs     Unclear if this was related to statin, gallstones, alcohol consumption     GERD (gastroesophageal reflux disease)     HLD (hyperlipidemia)     Hx of colonic polyps     Colonscopy 08/11/10; repeat 2017 per GI     Lumbar disc herniation 2006    L5-S1    Osteoporosis     Spondylosis     Urinary retention      [place]  Past Surgical History:  Procedure Laterality Date    HYSTERECTOMY      left ovaries in place     TUBAL LIGATION       SOC  Social History     Social History    Marital status: Divorced     Spouse name: N/A    Number of children: N/A    Years of education: N/A     Occupational History    Not on file.     Social History Main Topics    Smoking status: Former Smoker     Packs/day: 1.00     Years: 30.00     Quit date: 06/15/2013    Smokeless tobacco: Never Used    Alcohol use 5.4 oz/week     9 Glasses of wine per week    Drug use: No    Sexual activity: Not Currently     Social History Narrative    Lives alone currently in townhome     2 brothers and 2 sisters that live here    2 boys ([place], [place])    [place]  Family History   Problem Relation Age of Onset    Dementia Mother 77    Cancer Mother      lung    High cholesterol Mother     Stroke Mother     Parkinsonism Father     Heart Disease Father 74     passed    High cholesterol Father     Heart attack Father     Cancer Sister      Breast; in recovery    High cholesterol Sister     Cancer Brother      prostate    High cholesterol  Brother     Diabetes Brother     Aneurysm Neg Hx        Review of Systems   General: No fever or chills, no significant weight loss.   HEENT: No headaches, change in vision, throat pain, hoarseness.  Pulm: No SOB/Wheezing.  Cardiac: No chest pain, palpitations, syncope or pre-syncope, or lower extremity edema.   GI: no abdominal pain, diarrhea, constipation, melena, or blood per rectum.  GU: no dysuria, polyuria, or hematuria.         Physical Exam     Visit Vitals    BP 122/74 (BP Location: Left arm, Patient Position: Sitting, Cuff Size: adult)    Pulse 80    Ht 1.613 m (5' 3.5")  Comment: ACTUAL    Wt 63.5 kg (140 lb)  Comment: actual    BMI 24.41 kg/m2       GEN: Alert, pleasant well adult in NAD.   HEENT: Normocephalic and atraumatic. PERRL. EOMI. TMs WNL. Moist mucous membranes. No pharyngeal erythema or exudate.   NECK: Supple, no lymphadenopathy or thyromegaly or JVD.   PULM: Easy respirations, well aerated, Clear to auscultation bilaterally.   CVS: RRR, no murmur, normal S1& S2.   ABD: Soft. Normal bowel sounds. No hepatosplenomegaly, masses, tenderness.  No rebound tenderness.    EXTREMITIES: No cyanosis, clubbing or edema.  SKIN: No rashes or concerning lesions.      ASSESSMENT/PLAN- For this encounter:  (See also Comprehensive Assessment/Plan below)    1. Hypercholesterolemia  simvastatin (ZOCOR) 40 MG tablet    Lipid add Rfx to Drt LDL if Trig >400    ALT   2. Toe pain, left  * Foot LEFT standard AP, Lateral, Obliqu  Comprehensive Assessment/Plan   Neuropathic pain- Gaba 300 mg trial 2-2---> even 2-2-2  Toe pain- XRay.   AAA- Vascular surgery- 08/27/14 Due 02/25/15 Cathren Harsh.   Osteoporosis- Dexa showed T score -3.3 12/10/13, ordered by Dr Ruthy Dick.       Fosamax 70 mg weekly. To take 5 yrs and the consider stopping for a holiday.       Ca Vit D 500/200 BID      Check Vit D.   Could consider osteoporosis eval, Durwin Reges, M.D.et al  Hyperlipidemia- simvastatin 20mg  daily. Past 40.  Side effects with Atrorva and stopped. Increase back to 40.   Recheck FLP before next visit.   GERD- Past omeprazole 20 mg BID. Off. No further heartburn.  Belching- Gavascon PRN. helps.   Low Back pain- R sided sciatica.  MRI- 11/20/00 [facility]. L5-S1 right foraminal and far lateral disc protrusion/herniation that touches the existing right L5 nerve root. There is moderate right foraminal and mild left foraminal stenosis.  01/26/15: Increase gaba 300 mg,  to1 in the AM, 2 at bedtime. 300mg  bid .   Gallstones- asymptomatic.   Benign neoplasm of colon- repeat colonoscopy due 2017.   Constipation- improved with magnesium supplement.   Aspirin 81 mg daily   Health Maintenance-   --Lipids: LDL 137 04/15/14.   --CRC Screening: 2012. Repeat due 2017.[place] [facility] [place](sp?) [place].     Female:   Pap:   Mammo: 12/10/13.     Immunizations:  --Tdap:   --Flu: 11/04/13, 2016 [facility] [place].   --Shingles:   --Prevnar: 10/15/2013  --Pneumovax: 12/10/2014  [facility]. (changing). Preferred [place]     Consultants:   Vascular surgery- 08/27/14 Due 02/25/15 Cathren Harsh.       Patient should return 4 m, Osteoporosis, Hyperchol, pain, etc.     Patient Instructions   Fosamax:  may consider drug holiday if stable after treatment x 5 years (low-mod risk pt) or x 6-10 yr (high risk pt);   ---  Lab before next visit.       Pilar Plate, MD

## 2015-08-19 ENCOUNTER — Other Ambulatory Visit: Payer: Self-pay

## 2015-08-23 ENCOUNTER — Ambulatory Visit
Admission: RE | Admit: 2015-08-23 | Discharge: 2015-08-23 | Disposition: A | Discharge status: Home / Self Care | Payer: Medicare (Managed Care) | Source: Ambulatory Visit | Attending: Primary Care | Admitting: Primary Care

## 2015-08-23 DIAGNOSIS — Z1231 Encounter for screening mammogram for malignant neoplasm of breast: Secondary | ICD-10-CM | POA: Insufficient documentation

## 2015-09-16 ENCOUNTER — Ambulatory Visit: Payer: Medicare (Managed Care)

## 2015-09-16 ENCOUNTER — Ambulatory Visit
Admission: RE | Admit: 2015-09-16 | Discharge: 2015-09-16 | Disposition: A | Discharge status: Home / Self Care | Payer: Self-pay | Source: Ambulatory Visit | Attending: Vascular Surgery | Admitting: Vascular Surgery

## 2015-09-16 ENCOUNTER — Encounter: Payer: Self-pay | Admitting: Vascular Surgery

## 2015-09-16 ENCOUNTER — Ambulatory Visit: Payer: Self-pay | Admitting: Vascular Surgery

## 2015-09-16 VITALS — BP 130/70 | Ht 63.0 in | Wt 140.0 lb

## 2015-09-16 DIAGNOSIS — I714 Abdominal aortic aneurysm, without rupture, unspecified: Secondary | ICD-10-CM

## 2015-09-16 NOTE — Progress Notes (Signed)
Vascular Surgery Outpatient Clinic Follow-Up Visit    HPI:     [name] is a 75 y.o. female with PMH significant for HLD, chronic back pain and sciatica, GERD, and smoking history: quit 2015, who is seen in the Vascular Surgery outpatient clinic for routine follow-up regarding known AAA. Family history is negative for aneurysmal disease. Patient is accompanied to today's visit by her brother.    [name] denies any abdominal or back pain of unclear etiology; denies any post-prandial abdominal discomforts. She denies any history of symptoms consistent with claudication, rest pain, or non-healing LE ulceration.    Patient's medications, allergies, and medical, surgical, family, and social histories were updated, as appropriate, in eRecord during today's office visit.    Vascular risk factors:   Age: 75 y.o.  Female gender: no  Diabetes mellitus: no  Obesity: no, Body mass index is 24.8 kg/(m^2).  Hypertension: no  Hyperlipidemia: yes  Tobacco abuse: yes, quit 2015  Family history: no  VQI Info:  Occupation: Retired  Ambulatory status: Independent  Current living status: Home  HCP: Unknown  Code status: Full Comorbidities:  Coronary artery disease: no  Renal disease: no  Lung disease: no  Arrhythmia: no  Prior CVA: no  CHF: no  Prior PCI: no  Prior open heart surgery: no  Liver disease: no  Coagulopathy: no     REVIEW OF SYSTEMS  ROS - Pertinent items noted in HPI.    MEDICAL HISTORY  Past Medical History:   Diagnosis Date    AAA (abdominal aortic aneurysm)     Elevated LFTs     Unclear if this was related to statin, gallstones, alcohol consumption     GERD (gastroesophageal reflux disease)     HLD (hyperlipidemia)     Hx of colonic polyps     Colonscopy 08/11/10; repeat 2017 per GI     Lumbar disc herniation 2006    L5-S1    Osteoporosis     Spondylosis     Urinary retention        SURGICAL HISTORY  Past Surgical History:   Procedure Laterality Date    HYSTERECTOMY      left ovaries in place     TUBAL  LIGATION         FAMILY HISTORY  Family History   Problem Relation Age of Onset    Dementia Mother 63    Cancer Mother      lung    High cholesterol Mother     Stroke Mother     Parkinsonism Father     Heart Disease Father 64     passed    High cholesterol Father     Heart attack Father     Cancer Sister      Breast; in recovery    High cholesterol Sister     Cancer Brother      prostate    High cholesterol Brother     Diabetes Brother     Aneurysm Neg Hx         SOCIAL HISTORY   reports that she quit smoking about 2 years ago. Her smoking use included Cigarettes. She has a 30.00 pack-year smoking history. She has never used smokeless tobacco. She reports that she drinks about 5.4 oz of alcohol per week  She reports that she does not use illicit drugs.     ALLERGIES  Lipitor [atorvastatin]     MEDICATIONS  Current Outpatient Prescriptions   Medication Sig  Magnesium 250 MG TABS Take 250 mg by mouth daily    simvastatin (ZOCOR) 40 MG tablet Take 1 tablet (40 mg total) by mouth daily (with dinner)    gabapentin (GABAPENTIN) 300 MG capsule 1 in the AM, 2 at bedtime. (Patient taking differently: 2 times daily   2 in the AM, 2 at bedtime.)    alendronate (FOSAMAX) 70 MG tablet Take 1 tablet (70 mg total) by mouth every 7 days   Take with a full glass of water. Do not eat or lie down for 30 min.    aspirin 81 MG tablet Take 1 tablet (81 mg total) by mouth daily    calcium-vitamin D (OSCAL-500) 500-200 MG-UNIT per tablet Take 2 tablets by mouth 2 times daily     No current facility-administered medications for this visit.         Objective:      BP 130/70 Comment: left arm   Ht 1.6 m (5\' 3" )   Wt 63.5 kg (140 lb)   BMI 24.8 kg/m2     Imaging:  Abdominal aorta is aneurysmal with a maximum diameter of 4.8cm, and normal bilateral iliacs.    Bilateral popliteal arteries appear patent and without evidence of aneurysm or atherosclerotic plaque.    Physical Exam:      General appearance: healthy, alert,  active and no distress  HEENT: Normocephalic, atraumatic  CV: regular rate and rhythm and no carotid bruit  Lungs: CTA bilaterally, respirations unlabored  Abd: soft, non-tender; aneurysm pulsation palpable left of umbilicus  Extremities: Bilateral lower extremities are warm and well perfused without any evidence of clubbing, cyanosis, or limb ischemia. Intact DP and PT pulses. No edema.   Pulses:  Right Radial: Normal  Left Radial: Normal  Right Femoral: Normal  Left Femoral: Normal  Left Femoral: Normal  Right Popliteal: Normal  Left Popliteal: Normal  Right Dorsalis Pedis: Normal  Left Dorsalis Pedis: Normal  Right Posterior Tibial: Normal  Left Posterior Tibial: Normal  Neuro: normal without focal findings, mental status, speech normal, alert and oriented x3 and sensation grossly normal    Assessment:   [name] is a 75 y.o. female with 4.8cm abdominal aortic aneurysm, no popliteal aneurysm identified.    Plan:      Results of the imaging studies were reviewed with the patient.   Recommend continued medical management and surveillance of AAA, no indication for surgical intervention at this time. Indications for surgery reviewed: size >5cm or growth >0.5cm in 6 months.   Continued risk factor modification: daily antiplatelet and statin therapy.   RTC 6 months - aortic duplex followed by office visit.   Seek immediate medical attention with any abdominal or back pain of unclear etiology.   Phone clinic with any other questions/concerns prior to next office visit.    Tacey Heap, NP  09/16/2015 at 6:32 PM

## 2015-09-19 LAB — CV US LOWER EXTREMITY ARTERIES RIGHT
Left Popliteal Artery Diam A-P Dist: 0.35 cm
Left Popliteal Artery Diam A-P Mid: 0.41 cm
Left Popliteal Artery Diam A-P Prox: 0.38 cm
Left Popliteal Artery Diam Trans Dist: 0.39 cm
Left Popliteal Artery Diam Trans Mid: 0.42 cm
Left Popliteal Artery Diam Trans Prox: 0.53 cm
Left Popliteal Artery EDV Dist: 0 cm/s
Left Popliteal Artery EDV Mid: 0 cm/s
Left Popliteal Artery EDV Prox: 0 cm/s
Left Popliteal Artery PSV Dist: 78.94 cm/s
Left Popliteal Artery PSV Mid: 48.64 cm/s
Left Popliteal Artery PSV Prox: 67.35 cm/s
Right Popliteal Artery Diam A-P Dist: 0.44 cm
Right Popliteal Artery Diam A-P Mid: 0.44 cm
Right Popliteal Artery Diam A-P Prox: 0.52 cm
Right Popliteal Artery Diam Trans Dist: 0.48 cm
Right Popliteal Artery Diam Trans Mid: 0.71 cm
Right Popliteal Artery Diam Trans Prox: 0.57 cm
Right Popliteal Artery EDV Dist: 0 cm/s
Right Popliteal Artery EDV Mid: 8.56 cm/s
Right Popliteal Artery EDV Prox: 0 cm/s
Right Popliteal Artery PSV Dist: 57.42 cm/s
Right Popliteal Artery PSV Mid: 51.69 cm/s
Right Popliteal Artery PSV Prox: 62.17 cm/s

## 2015-09-19 LAB — CV DOPPLER AORTA COMPLETE
Aorta Diameter A-P Distal: 4.77 cm
Aorta Diameter A-P Mid: 4.03 cm
Aorta Diameter A-P Prox: 3.29 cm
Aorta Diameter Trans Distal: 4.67 cm
Aorta Diameter Trans Mid: 3.62 cm
Aorta Diameter Trans Prox: 2.81 cm
Aorta EDV Distal: 0 cm/s
Aorta EDV Mid: 0 cm/s
Aorta EDV Prox: 0 cm/s
Aorta PSV Distal: 29.4 cm/s
Aorta PSV Mid: 38.01 cm/s
Aorta PSV Prox: 50.31 cm/s
Left Common Iliac Artery Prox AP Diameter: 0.74 cm
Left Common Iliac Artery Prox EDV: 9.39 cm/s
Left Common Iliac Artery Prox PSV: 184.28 cm/s
Left Common Iliac Artery Prox Trans Diameter: 0.85 cm
Right Common Iliac Artery Prox AP Diameter: 0.93 cm
Right Common Iliac Artery Prox EDV: 10.04 cm/s
Right Common Iliac Artery Prox PSV: 168.09 cm/s

## 2015-11-17 ENCOUNTER — Other Ambulatory Visit
Admission: RE | Admit: 2015-11-17 | Discharge: 2015-11-17 | Disposition: A | Discharge status: Home / Self Care | Payer: Self-pay | Source: Ambulatory Visit | Attending: Primary Care | Admitting: Primary Care

## 2015-11-17 DIAGNOSIS — E7800 Pure hypercholesterolemia, unspecified: Secondary | ICD-10-CM

## 2015-11-17 LAB — ALT: ALT: 20 U/L (ref 0–35)

## 2015-11-17 LAB — LIPID PANEL
Chol/HDL Ratio: 2.8
Cholesterol: 215 mg/dL — AB
HDL: 77 mg/dL
LDL Calculated: 120 mg/dL
Non HDL Cholesterol: 138 mg/dL
Triglycerides: 91 mg/dL

## 2015-12-13 ENCOUNTER — Telehealth: Payer: Self-pay | Admitting: Primary Care

## 2015-12-13 NOTE — Telephone Encounter (Signed)
Patient stated that she is going to stop taking gabapentin (GABAPENTIN) 300 MG capsule due to blurred vision, its not helping with pain and disrupting her sleep. Please advise    Thank you,Geffrey Michaelsen Tamala Julian

## 2015-12-13 NOTE — Telephone Encounter (Signed)
Noted  

## 2015-12-14 ENCOUNTER — Ambulatory Visit: Payer: Self-pay | Admitting: Primary Care

## 2015-12-22 ENCOUNTER — Other Ambulatory Visit: Payer: Self-pay | Admitting: Family Medicine

## 2015-12-22 ENCOUNTER — Ambulatory Visit
Admission: RE | Admit: 2015-12-22 | Discharge: 2015-12-22 | Disposition: A | Discharge status: Home / Self Care | Payer: Medicare (Managed Care) | Source: Ambulatory Visit | Attending: Family Medicine | Admitting: Family Medicine

## 2015-12-22 ENCOUNTER — Ambulatory Visit: Payer: Self-pay | Admitting: Family Medicine

## 2015-12-22 ENCOUNTER — Encounter: Payer: Self-pay | Admitting: Family Medicine

## 2015-12-22 ENCOUNTER — Other Ambulatory Visit: Payer: Self-pay | Admitting: Gastroenterology

## 2015-12-22 VITALS — BP 124/82 | HR 72 | Ht 63.0 in | Wt 140.0 lb

## 2015-12-22 DIAGNOSIS — M545 Low back pain, unspecified: Secondary | ICD-10-CM

## 2015-12-22 DIAGNOSIS — M79675 Pain in left toe(s): Secondary | ICD-10-CM | POA: Insufficient documentation

## 2015-12-22 DIAGNOSIS — M810 Age-related osteoporosis without current pathological fracture: Secondary | ICD-10-CM

## 2015-12-22 DIAGNOSIS — E7800 Pure hypercholesterolemia, unspecified: Secondary | ICD-10-CM

## 2015-12-22 MED ORDER — SIMVASTATIN 40 MG PO TABS *I*
40.0000 mg | ORAL_TABLET | Freq: Every day | ORAL | 1 refills | Status: DC
Start: 2015-12-22 — End: 2016-04-27

## 2015-12-22 MED ORDER — ALENDRONATE SODIUM 70 MG PO TABS *I*
70.0000 mg | ORAL_TABLET | ORAL | 1 refills | Status: DC
Start: 2015-12-22 — End: 2016-04-27

## 2015-12-22 MED ORDER — NAPROXEN 375 MG PO TBEC *A*
375.0000 mg | DELAYED_RELEASE_TABLET | Freq: Two times a day (BID) | ORAL | 1 refills | Status: DC
Start: 2015-12-22 — End: 2016-04-27

## 2015-12-23 ENCOUNTER — Telehealth: Payer: Self-pay

## 2015-12-23 ENCOUNTER — Encounter: Payer: Self-pay | Admitting: Family Medicine

## 2015-12-23 NOTE — Telephone Encounter (Signed)
(  [name], [name] (Self) [phone] (H)       Spoke to Patient        Spoke to patient ~~read verbatim DR Audrea Muscat message below.  Patient questioned back X-ray results and Tylenol dosing as discussed with Doug yesterday at office visit.     Placed patient on hold and reviewed questions with Doug.  [name] states he sent patient a detailed  My chart E-mail message about the back X -ray results and recommends Acetaminophen XS 2 tablets at bedtime.     Relayed information to patient about e-mail message and Acetaminophen dosing. Patient states understanding.   advised to call back with any questions or concerns.  Patient  states understanding and will follow disposition.

## 2015-12-23 NOTE — Telephone Encounter (Signed)
-----   Message from Pilar Plate, MD sent at 12/23/2015  9:13 AM EST -----  XR negative for a fracture. Seems like it was a very bad sparin. Not much to do except give it time.

## 2015-12-23 NOTE — Progress Notes (Signed)
Patient ID:  [name]  09/01/1940    Subjective:    CC:  Follow-up (4 month F/U for osteoporosis); Back Pain (severe back pain ~radiates from shoulder all the way down right side. having a hard time lifting right leg); Medication Problem/Question (wants to go off gabapentin~~does not feel it is helping); Medication Refill (Fosamax, Zocor); Tremors (hand tremors~hard time writing); and Aneurysm (abdominal aneurysm~~recent DX)    HPI  Patient has a history of multiple level lumbar disc herniation up to 10 years ago.  She's not had further imaging since that time.  She does struggle with anxiety related to confined spaces so was not anxious to pursue repeat MRI.      [name] has had a recent increase in her lumbar area discomfort she's having trouble getting out of bed because of the discomfort in the mornings.  Pain is primarily focused in the lumbar and iliac areas but she also gets some pain in the buttock.  She does not have pain radiating below the buttock.  However she did years back with herniated disks.    She did do physical therapy in the past noticed minimal improvement.  She is sensitive to the significant cost of having to restart PT.     2) patient has a history of abdominal aortic aneurysm most recent ultrasound shows 4.6 cm.  She's very concerned about travel or other activities that she does not want to be somewhere where she cannot be evaluated managed.    3) patient's been on gabapentin for many years 300 mg twice a day and she feels this is of minimal value at this point.  She's also concerned about a tremor these 2 things may be related and she is interested in tapering or discontinuing the gabapentin.    Medications were reviewed and reconciled with the patient in eRecord today; and Yes changes were made.  Current Outpatient Prescriptions   Medication Sig Dispense Refill    simvastatin (ZOCOR) 40 MG tablet Take 1 tablet (40 mg total) by mouth daily (with dinner) 90 tablet 1    alendronate  (FOSAMAX) 70 MG tablet Take 1 tablet (70 mg total) by mouth every 7 days   Take with a full glass of water. Do not eat or lie down for 30 min. 12 tablet 1    Magnesium 250 MG TABS Take 250 mg by mouth daily      aspirin 81 MG tablet Take 1 tablet (81 mg total) by mouth daily 90 tablet 1    calcium-vitamin D (OSCAL-500) 500-200 MG-UNIT per tablet Take 2 tablets by mouth 2 times daily      naproxen ec (NAPROSYN EC) 375 MG tablet Take 1 tablet (375 mg total) by mouth 2 times daily 60 tablet 1     No current facility-administered medications for this visit.        Review of Systems   Respiratory: Negative for cough.    Gastrointestinal: Negative for abdominal pain.   Musculoskeletal: Positive for back pain.   Neurological: Negative for dizziness, tingling, sensory change and focal weakness.   Psychiatric/Behavioral: The patient is nervous/anxious (MRI).          Objective:    Vitals:    12/22/15 1301   BP: 124/82   Pulse: 72   SpO2: 98%   Weight: 63.5 kg (140 lb)   Height: 1.6 m (5\' 3" )     Physical Exam   Constitutional: She is oriented to person, place, and time and  well-developed, well-nourished, and in no distress.   Patient's well-groomed and does seem to be in some mild muscle skeletal distress   Eyes: Pupils are equal, round, and reactive to light.   Neck: Normal range of motion.   Cardiovascular: Normal rate, regular rhythm and normal heart sounds.    Pulmonary/Chest: Effort normal and breath sounds normal.   Musculoskeletal: Normal range of motion.   She transfers sitting to standing slowly braces her back with her hands on her knees she stands erect she also has pain transition sitting to supine.  She is able to complete these movements however.  And sits back up from supine to sitting with minimal difficulty.  She does have some contralateral posterior pain with a Trendelenburg.  Rest of her exam is rather benign with the exception that she has very tight paraspinous muscles bilaterally.    Neurological:  She is alert and oriented to person, place, and time.   Skin: Skin is warm and dry.   Psychiatric: Mood, affect and judgment normal.       Assessment/Plan:  [name] was seen today for follow-up, back pain, medication problem/question, medication refill, tremors and aneurysm.    Diagnoses and all orders for this visit:    Lower back pain  -     naproxen ec (NAPROSYN EC) 375 MG tablet; Take 1 tablet (375 mg total) by mouth 2 times daily  -     Cancel: SPINE LUMBAR 2 OR 3 VIEWS LIMITED; Future  Patient's given some paperwork and physical descriptions of back exercises to initiate.  I think she would get most improvement from some stretching and relaxation and range of motion exercises of her lower back.  She actually might do well with lower back massage therapy and possibly a muscle relaxant.    I did give her instructions for tapering gabapentin.    We did also discuss at length her aortic aneurysm.  I reassured her regarding eminent risk of aortic rupture and that travels such as driving and flying does not put her at risk.  We also discussed the possibility of pursuing with vascular moving ahead with surgery as she is very close to threshold measurements.    Hypercholesterolemia  -     simvastatin (ZOCOR) 40 MG tablet; Take 1 tablet (40 mg total) by mouth daily (with dinner)    Osteoporosis  -     alendronate (FOSAMAX) 70 MG tablet; Take 1 tablet (70 mg total) by mouth every 7 days   Take with a full glass of water. Do not eat or lie down for 30 min.    Patient will try the above exercises and tapering gabapentin we have initiated naproxen twice daily.  Follow-up in 3 weeks.    Return in about 3 weeks (around 01/12/2016).    Romualdo Bolk FNP-C

## 2015-12-23 NOTE — Progress Notes (Signed)
sent patient an email regarding results

## 2016-01-11 ENCOUNTER — Telehealth: Payer: Self-pay | Admitting: Primary Care

## 2016-01-11 NOTE — Telephone Encounter (Signed)
Please advise - she saw Doug on 12/22/15

## 2016-01-11 NOTE — Telephone Encounter (Signed)
Patient called stating she feels the Naproxen she started a few weeks ago is causing loose stool and some cramping. Patient states she has only been taking 1 tablet daily the past few days (instead of 2).  Patient states her back pain is a bit better now and wonders if she should Dx the medication?  Patient also wonders if she does stop taking this medication if she will still need to keep her f/u appointment with NP on 01/19/16?

## 2016-01-12 NOTE — Telephone Encounter (Signed)
Would stop the Naproxyn.  If pain worsens could try an alternative such as meloxicam 7.5 mg daily with food.  If the pain is improved she could cancel the appointment.

## 2016-01-13 NOTE — Telephone Encounter (Signed)
Writer called and spoke to patient. She will stop the Naproxen but does not want the Meloxicam at this time. She said that the Tylenol is helping her at night as well as the exercises. She will plan on keeping her appointment on 01/19/16. She was thankful for the call.

## 2016-01-19 ENCOUNTER — Ambulatory Visit: Payer: Self-pay | Admitting: Family Medicine

## 2016-01-19 VITALS — BP 132/70 | HR 61 | Ht 63.0 in | Wt 143.9 lb

## 2016-01-19 DIAGNOSIS — M5430 Sciatica, unspecified side: Secondary | ICD-10-CM

## 2016-01-19 DIAGNOSIS — M791 Myalgia, unspecified site: Secondary | ICD-10-CM

## 2016-01-19 DIAGNOSIS — G5700 Lesion of sciatic nerve, unspecified lower limb: Secondary | ICD-10-CM

## 2016-01-19 DIAGNOSIS — E7800 Pure hypercholesterolemia, unspecified: Secondary | ICD-10-CM

## 2016-01-19 NOTE — Patient Instructions (Signed)
Opposite arm and opposite leg lifts while lying on stomach 10 times each side   -Lying on left side and hanging right leg over left leg and doing straight leg lifts.  - Stand and bending backward to extend back and stretch   - Lunge position and extend back with hands on hips

## 2016-01-19 NOTE — Progress Notes (Signed)
Patient ID:  MYALEE TACK  [birth year]    Subjective:    CC:  Back Pain    HPI  F/u for low back and right leg pain. Patient reports lower back and right leg pain that has been going on for months.  Has tried different stretching techniques and positioning with pillow support during sleep after last visit with minimal relief.  Patient reports pain still bothers her with it shooting and radiating down right leg and wrapping around from lower back to right groin.  She doesn't report improvement in range of motion and is now successfully able to get in and out of her car without as much pain.  She has been doing some mild back exercises and she feels that extension exercises do seem to give her relief.    She did not tolerate NSAID therapy as it created abdominal discomfort and change in bowel function and so has discontinued.    Patient's also complaining today of intermittent upper arm pain that is exacerbated by external or extension bilaterally.  She has had statins discontinued in the past for muscle skeletal complaints.    Medications were reviewed and reconciled with the patient in eRecord today; and Yes Reitzel subjective shoulder pain and a history all minutes changes were made.  Current Outpatient Prescriptions   Medication Sig Dispense Refill    naproxen ec (NAPROSYN EC) 375 MG tablet Take 1 tablet (375 mg total) by mouth 2 times daily 60 tablet 1    simvastatin (ZOCOR) 40 MG tablet Take 1 tablet (40 mg total) by mouth daily (with dinner) 90 tablet 1    alendronate (FOSAMAX) 70 MG tablet Take 1 tablet (70 mg total) by mouth every 7 days   Take with a full glass of water. Do not eat or lie down for 30 min. 12 tablet 1    Magnesium 250 MG TABS Take 250 mg by mouth daily      aspirin 81 MG tablet Take 1 tablet (81 mg total) by mouth daily 90 tablet 1    calcium-vitamin D (OSCAL-500) 500-200 MG-UNIT per tablet Take 2 tablets by mouth 2 times daily       No current facility-administered medications for this  visit.        Review of Systems   Reason unable to perform ROS: per HPI.         Objective:    Vitals:    01/19/16 1033   BP: 132/70   BP Location: Left arm   Patient Position: Sitting   Cuff Size: adult   Pulse: 61   SpO2: 96%   Weight: 65.3 kg (143 lb 14.4 oz)   Height: 1.6 m (5\' 3" )     Physical Exam   Constitutional: She is oriented to person, place, and time and well-developed, well-nourished, and in no distress.   Eyes: Pupils are equal, round, and reactive to light.   Neck: Normal range of motion.   Cardiovascular: Normal rate, regular rhythm and normal heart sounds.    Pulmonary/Chest: Effort normal.   Musculoskeletal: Normal range of motion.   Patient is ambulating comfortably she transfers seeding to standing without signs of discomfort she ambulates with a mild right sided antalgic gait.  She has pain in the supine position rotating to left side.  She also had tender with she also had pain pain with right hip extension posteriorly.  She is tender on palpation in the center right buttock on the sciatic nerve root.  Neurological: She is alert and oriented to person, place, and time.   Lower extremity reflexes are 1+ and bilaterally equal she has 5+ and bilaterally equal lower extremity strength.   Skin: Skin is warm and dry.   Psychiatric: Mood, affect and judgment normal.       Assessment/Plan:    Lameeka was seen today for back pain.    Diagnoses and all orders for this visit:    Sciatica  Piriformis syndrome  Patient's given a set of further exercises as detailed in her discharge instructions she'll work on these and follow-up through my chart.  if symptoms are not continuing to improve     Will forget It but this up there holesterolemia  -     Comprehensive metabolic panel; Future  -     Lipid add Rfx to Drt LDL if Trig >400; Future  -     Iron; Future  -     CBC and differential; Future    Myalgia  -     Lipid add Rfx to Drt LDL if Trig >400; Future    - f/u in 3 months to recheck BP and repeat lipid  panel   - f/u in 3 weeks via email or phone call to recheck sciatic pain after starting new stretching techniques  - Stop statin and see if helps decrease muscular pain in bilateral upper extremities and RLE.     Return in about 3 months (around 04/18/2016).\      Romualdo Bolk FNP-C

## 2016-03-16 ENCOUNTER — Ambulatory Visit
Admission: RE | Admit: 2016-03-16 | Discharge: 2016-03-16 | Disposition: A | Discharge status: Home / Self Care | Payer: Medicare (Managed Care) | Source: Ambulatory Visit | Attending: Vascular Surgery | Admitting: Vascular Surgery

## 2016-03-16 ENCOUNTER — Encounter: Payer: Self-pay | Admitting: Vascular Surgery

## 2016-03-16 ENCOUNTER — Ambulatory Visit: Payer: Medicare (Managed Care) | Admitting: Vascular Surgery

## 2016-03-16 VITALS — BP 130/70 | Ht 63.0 in | Wt 143.0 lb

## 2016-03-16 DIAGNOSIS — I714 Abdominal aortic aneurysm, without rupture, unspecified: Secondary | ICD-10-CM

## 2016-03-18 LAB — CV DOPPLER AORTA COMPLETE
Aorta Diameter A-P Distal: 4.77 cm
Aorta Diameter A-P Mid: 4.17 cm
Aorta Diameter A-P Prox: 2.32 cm
Aorta Diameter Trans Distal: 4.4 cm
Aorta Diameter Trans Mid: 3.65 cm
Aorta Diameter Trans Prox: 2.12 cm
Aorta EDV Distal: 0 cm/s
Aorta EDV Mid: 10.64 cm/s
Aorta EDV Prox: 10.64 cm/s
Aorta PSV Distal: 20.76 cm/s
Aorta PSV Mid: 37.69 cm/s
Aorta PSV Prox: 49.03 cm/s
Left Common Iliac Artery Prox AP Diameter: 0.78 cm
Left Common Iliac Artery Prox EDV: 15.87 cm/s
Left Common Iliac Artery Prox PSV: 206.95 cm/s
Left Common Iliac Artery Prox Trans Diameter: 1.09 cm
Right Common Iliac Artery Prox AP Diameter: 0.75 cm
Right Common Iliac Artery Prox EDV: 10.12 cm/s
Right Common Iliac Artery Prox PSV: 191.74 cm/s
Right Common Iliac Artery Prox Trans Diameter: 1 cm

## 2016-03-19 NOTE — Progress Notes (Signed)
[facility] Outpatient [facility] Follow-Up Visit    HPI:     BREXLEE HEBERLEIN is a 76 y.o. female history of known Aortic Abdominal Aneurysm here for routine follow-up and surveillance. PMH significant for HLD, chronic back pain, Sciatica, herniated discs, GERD. Smoking history: former smoker, quit in 2015. No FHx significant for aneurysmal disease. Patient is accompanied to today's office visit by her brother.      Vascular risk factors:   Age: 76 y.o.  Female gender: no  Diabetes mellitus: no  Obesity: no, Body mass index is 25.33 kg/(m^2).  Hypertension: yes  Hyperlipidemia: yes  Tobacco abuse: yes, 10 years  Family history: no  VQI Info:  Occupation: Retired  Ambulatory status: Ambulates  Current living status: Lives at home Comorbidities:  Coronary artery disease: no  Renal disease: no  Lung disease: no,   Arrhythmia: no  Prior CVA: no  CHF: no  Prior PCI: no  Prior open heart surgery: no  Liver disease: no  Coagulopathy: no     REVIEW OF SYSTEMS  ROS    MEDICAL HISTORY  Past Medical History:   Diagnosis Date    AAA (abdominal aortic aneurysm)     Elevated LFTs     Unclear if this was related to statin, gallstones, alcohol consumption     GERD (gastroesophageal reflux disease)     HLD (hyperlipidemia)     Hx of colonic polyps     Colonscopy 08/11/10; repeat 2017 per GI     Lumbar disc herniation 2006    L5-S1    Osteoporosis     Spondylosis     Urinary retention        SURGICAL HISTORY  Past Surgical History:   Procedure Laterality Date    HYSTERECTOMY      left ovaries in place     TUBAL LIGATION         FAMILY HISTORY  Family History   Problem Relation Age of Onset    Dementia Mother 21    Cancer Mother      lung    High cholesterol Mother     Stroke Mother     Parkinsonism Father     Heart Disease Father 47     passed    High cholesterol Father     Heart attack Father     Cancer Sister      Breast; in recovery    High cholesterol Sister     Cancer Brother      prostate    High cholesterol  Brother     Diabetes Brother     Aneurysm Neg Hx         SOCIAL HISTORY   reports that she quit smoking about 2 years ago. Her smoking use included Cigarettes. She has a 30.00 pack-year smoking history. She has never used smokeless tobacco. She reports that she drinks about 5.4 oz of alcohol per week  She reports that she does not use illicit drugs.     ALLERGIES  Lipitor [atorvastatin]     MEDICATIONS  Current Outpatient Prescriptions   Medication Sig    alendronate (FOSAMAX) 70 MG tablet Take 1 tablet (70 mg total) by mouth every 7 days   Take with a full glass of water. Do not eat or lie down for 30 min.    Magnesium 250 MG TABS Take 250 mg by mouth daily    aspirin 81 MG tablet Take 1 tablet (81 mg total) by mouth daily  calcium-vitamin D (OSCAL-500) 500-200 MG-UNIT per tablet Take 2 tablets by mouth 2 times daily    naproxen ec (NAPROSYN EC) 375 MG tablet Take 1 tablet (375 mg total) by mouth 2 times daily    simvastatin (ZOCOR) 40 MG tablet Take 1 tablet (40 mg total) by mouth daily (with dinner)     No current facility-administered medications for this visit.         Objective:      Vitals:    03/16/16 1039   BP: 130/70   Weight: 64.9 kg (143 lb)   Height: 1.6 m (5\' 3" )         Imaging:  History: Known AAA 4.8 cm   Prior Studies: 09/16/15    Abdominal aorta is aneurysmal with a maximum diameter of 4.77 cm, and normal bilateral iliac arteries.    Physical Exam:      General appearance: healthy, alert, active, no distress and cooperative  HEENT: PERRLA  CV: regular rate and rhythm, S1, S2 normal, no murmur, click, rub or gallop  Lungs:clear  Abd: soft, non-tender. Bowel sounds normal. No masses,  no organomegaly   Extremities: Palpable AAA, femoral, DP/PT pulses  Neuro: normal without focal findings, mental status, speech normal, alert and oriented x3, PERLA and reflexes normal and symmetric    Assessment:   LANDRIE BEALE is a 76 y.o. female with stable infrarenal AAA.      Plan:   1. F/u 6 months  with repeat AAA.  2. Should AAA reach a diameter of 50 mm, will obtain contrast-enhanced imaging to assess anatomy and suitability for endoluminal repair.       Turner Daniels, MD MPH  03/19/2016 at 1:11 PM

## 2016-04-12 ENCOUNTER — Other Ambulatory Visit
Admission: RE | Admit: 2016-04-12 | Discharge: 2016-04-12 | Disposition: A | Discharge status: Home / Self Care | Payer: Medicare (Managed Care) | Source: Ambulatory Visit | Attending: Family Medicine | Admitting: Family Medicine

## 2016-04-12 DIAGNOSIS — M791 Myalgia, unspecified site: Secondary | ICD-10-CM

## 2016-04-12 DIAGNOSIS — E7800 Pure hypercholesterolemia, unspecified: Secondary | ICD-10-CM

## 2016-04-12 LAB — CBC AND DIFFERENTIAL
Baso # K/uL: 0.1 10*3/uL (ref 0.0–0.1)
Basophil %: 2.2 %
Eos # K/uL: 0.1 10*3/uL (ref 0.0–0.4)
Eosinophil %: 2.4 %
Hematocrit: 40 % (ref 34–45)
Hemoglobin: 13.3 g/dL (ref 11.2–15.7)
IMM Granulocytes #: 0 10*3/uL (ref 0.0–0.1)
IMM Granulocytes: 0.2 %
Lymph # K/uL: 1.7 10*3/uL (ref 1.2–3.7)
Lymphocyte %: 37.4 %
MCH: 31 pg/cell (ref 26–32)
MCHC: 33 g/dL (ref 32–36)
MCV: 94 fL (ref 79–95)
Mono # K/uL: 0.4 10*3/uL (ref 0.2–0.9)
Monocyte %: 9.3 %
Neut # K/uL: 2.2 10*3/uL (ref 1.6–6.1)
Nucl RBC # K/uL: 0 10*3/uL (ref 0.0–0.0)
Nucl RBC %: 0 /100 WBC (ref 0.0–0.2)
Platelets: 289 10*3/uL (ref 160–370)
RBC: 4.3 MIL/uL (ref 3.9–5.2)
RDW: 13.3 % (ref 11.7–14.4)
Seg Neut %: 48.5 %
WBC: 4.6 10*3/uL (ref 4.0–10.0)

## 2016-04-12 LAB — COMPREHENSIVE METABOLIC PANEL
ALT: 18 U/L (ref 0–35)
AST: 25 U/L (ref 0–35)
Albumin: 4.6 g/dL (ref 3.5–5.2)
Alk Phos: 52 U/L (ref 35–105)
Anion Gap: 13 (ref 7–16)
Bilirubin,Total: 0.4 mg/dL (ref 0.0–1.2)
CO2: 26 mmol/L (ref 20–28)
Calcium: 9.6 mg/dL (ref 8.6–10.2)
Chloride: 99 mmol/L (ref 96–108)
Creatinine: 0.82 mg/dL (ref 0.51–0.95)
GFR,Black: 80 *
GFR,Caucasian: 69 *
Glucose: 92 mg/dL (ref 60–99)
Lab: 14 mg/dL (ref 6–20)
Potassium: 4.3 mmol/L (ref 3.3–5.1)
Sodium: 138 mmol/L (ref 133–145)
Total Protein: 7 g/dL (ref 6.3–7.7)

## 2016-04-12 LAB — LIPID PANEL
Chol/HDL Ratio: 4.4
Cholesterol: 298 mg/dL — AB
HDL: 67 mg/dL
LDL Calculated: 208 mg/dL
Non HDL Cholesterol: 231 mg/dL
Triglycerides: 115 mg/dL

## 2016-04-12 LAB — IRON: Iron: 132 ug/dL (ref 34–165)

## 2016-04-13 NOTE — Progress Notes (Signed)
Review results at next visit

## 2016-04-27 ENCOUNTER — Encounter: Payer: Self-pay | Admitting: Family Medicine

## 2016-04-27 ENCOUNTER — Ambulatory Visit: Payer: Medicare (Managed Care) | Attending: Primary Care | Admitting: Family Medicine

## 2016-04-27 ENCOUNTER — Other Ambulatory Visit: Payer: Self-pay | Admitting: Family Medicine

## 2016-04-27 VITALS — BP 160/84 | HR 67 | Wt 144.0 lb

## 2016-04-27 DIAGNOSIS — M810 Age-related osteoporosis without current pathological fracture: Secondary | ICD-10-CM

## 2016-04-27 DIAGNOSIS — R030 Elevated blood-pressure reading, without diagnosis of hypertension: Secondary | ICD-10-CM

## 2016-04-27 DIAGNOSIS — E785 Hyperlipidemia, unspecified: Secondary | ICD-10-CM

## 2016-04-27 DIAGNOSIS — M7540 Impingement syndrome of unspecified shoulder: Secondary | ICD-10-CM

## 2016-04-27 MED ORDER — SIMVASTATIN 40 MG PO TABS *I*
40.0000 mg | ORAL_TABLET | Freq: Every day | ORAL | 1 refills | Status: DC
Start: 2016-04-27 — End: 2016-10-18

## 2016-04-27 MED ORDER — LOSARTAN POTASSIUM 50 MG PO TABS *I*
50.0000 mg | ORAL_TABLET | Freq: Every day | ORAL | 5 refills | Status: DC
Start: 2016-04-27 — End: 2016-11-06

## 2016-04-27 MED ORDER — CELECOXIB 200 MG PO CAPS *I*
200.0000 mg | ORAL_CAPSULE | Freq: Every day | ORAL | 0 refills | Status: DC
Start: 2016-04-27 — End: 2016-06-01

## 2016-05-02 NOTE — Progress Notes (Signed)
Patient ID:  [name]  [birth year]    Subjective:    CC:  Follow-up (3 month F/U) and Lab Results (review results~~?? back on cholesterol meds~~still with joint pain despite stopping med )    HPI    Patient recently had lab work done showing significant elevation in her cholesterol levels while off statin.  She did not get significant improvement in her myalgia symptoms continues to have primarily shoulder pain.     Shoulder pain as most consistently noted with  Reaching for objects over her shoulder height.  She also has discomfort when lying on her side.  She has difficulty feeling comfortable.    She's also had some higher recent blood pressure values.  No complaints of shortness of breath chest pain or exertional limitation.    [name] has a current medication list which includes the following prescription(s): acetaminophen, magnesium, aspirin, calcium-vitamin d, simvastatin, celecoxib, losartan, and alendronate.  [name] is allergic to lipitor [atorvastatin].      Medications and allergies were reviewed and reconciled with the patient in eRecord today; and Yes changes were made.      Review of Systems   Respiratory: Negative for cough.    Cardiovascular: Negative for chest pain and palpitations.   Musculoskeletal: Positive for joint pain.         Objective:    Vitals:    04/27/16 0933 04/27/16 0939   BP: 158/84 160/84   BP Location: Right arm Left arm   Patient Position: Sitting Sitting   Cuff Size: adult adult   Pulse: 67    SpO2: 99%    Weight: 65.3 kg (144 lb)      Physical Exam   Constitutional: She is oriented to person, place, and time and well-developed, well-nourished, and in no distress.   Eyes: Pupils are equal, round, and reactive to light.   Neck: Normal range of motion.   Cardiovascular: Normal rate, regular rhythm and normal heart sounds.    Pulmonary/Chest: Effort normal.   Musculoskeletal: Normal range of motion. She exhibits no edema.   Patient is tender on palpation of the posterior  subacromial space.  She has shoulder pain with resisted internal rotation   Neurological: She is alert and oriented to person, place, and time.   Skin: Skin is warm and dry.   Psychiatric: Mood, affect and judgment normal.       Assessment/Plan:  [name] was seen today for follow-up and lab results.    Diagnoses and all orders for this visit:    Hyperlipidemia, unspecified hyperlipidemia type  -     simvastatin (ZOCOR) 40 MG tablet; Take 1 tablet (40 mg total) by mouth daily (with dinner)  -     Comprehensive metabolic panel; Future    Elevated blood pressure reading without diagnosis of hypertension  -     losartan (COZAAR) 50 MG tablet; Take 1 tablet (50 mg total) by mouth daily  -     Lipid add Rfx to Drt LDL if Trig >400; Future    Shoulder impingement syndrome, unspecified laterality  -     celecoxib (CELEBREX) 200 MG capsule; Take 1 capsule (200 mg total) by mouth daily  -     AMB REFERRAL TO PHYS / OCC THERAPY    Patient's encouraged to follow-up in 3-4 weeks to recheck blood pressure and check response to restarting simvastatin.  She'll follow up sooner if she develops episodes of syncope or cough or if muscle pain seems to exacerbate.  Return in about 4 weeks (around 05/25/2016), or B/P check shoulder pain 40 min.    Romualdo Bolk FNP-C

## 2016-05-23 ENCOUNTER — Other Ambulatory Visit
Admission: RE | Admit: 2016-05-23 | Discharge: 2016-05-23 | Disposition: A | Discharge status: Home / Self Care | Payer: Medicare (Managed Care) | Source: Ambulatory Visit | Attending: Family Medicine | Admitting: Family Medicine

## 2016-05-23 DIAGNOSIS — E785 Hyperlipidemia, unspecified: Secondary | ICD-10-CM | POA: Insufficient documentation

## 2016-05-23 DIAGNOSIS — R030 Elevated blood-pressure reading, without diagnosis of hypertension: Secondary | ICD-10-CM | POA: Insufficient documentation

## 2016-05-23 LAB — LIPID PANEL
Chol/HDL Ratio: 2.5
Cholesterol: 184 mg/dL
HDL: 74 mg/dL
LDL Calculated: 98 mg/dL
Non HDL Cholesterol: 110 mg/dL
Triglycerides: 58 mg/dL

## 2016-05-24 LAB — COMPREHENSIVE METABOLIC PANEL
ALT: 26 U/L (ref 0–35)
AST: 35 U/L (ref 0–35)
Albumin: 4.8 g/dL (ref 3.5–5.2)
Alk Phos: 62 U/L (ref 35–105)
Anion Gap: 14 (ref 7–16)
Bilirubin,Total: 0.3 mg/dL (ref 0.0–1.2)
CO2: 27 mmol/L (ref 20–28)
Calcium: 10.1 mg/dL (ref 8.6–10.2)
Chloride: 98 mmol/L (ref 96–108)
Creatinine: 0.86 mg/dL (ref 0.51–0.95)
GFR,Black: 75 *
GFR,Caucasian: 65 *
Glucose: 94 mg/dL (ref 60–99)
Lab: 14 mg/dL (ref 6–20)
Potassium: 4.9 mmol/L (ref 3.3–5.1)
Sodium: 139 mmol/L (ref 133–145)
Total Protein: 7.4 g/dL (ref 6.3–7.7)

## 2016-05-25 NOTE — Progress Notes (Signed)
Review results at next visit

## 2016-06-01 ENCOUNTER — Ambulatory Visit: Payer: Medicare (Managed Care) | Attending: Family Medicine | Admitting: Family Medicine

## 2016-06-01 ENCOUNTER — Encounter: Payer: Self-pay | Admitting: Family Medicine

## 2016-06-01 VITALS — BP 120/70 | HR 70 | Ht 63.0 in | Wt 144.0 lb

## 2016-06-01 DIAGNOSIS — I10 Essential (primary) hypertension: Secondary | ICD-10-CM

## 2016-06-01 DIAGNOSIS — E785 Hyperlipidemia, unspecified: Secondary | ICD-10-CM

## 2016-06-01 NOTE — Progress Notes (Signed)
Patient ID:  AROURA VASUDEVAN  [birth year]    Subjective:    CC:  Hypertension and Shoulder Pain    HPI    the patient's tolerating blood pressure medication very well. she's not having any complaint of lightheadedness, postural hypotension or cough.     She's excited about her lab work and the very significant improvement in her cholesterol.    She is exercising 5 times a week including yoga class twice a week.  She's had significant improvement in her right shoulder symptoms so does not believe she needs a referral to physical therapy.    Reann has a current medication list which includes the following prescription(s): acetaminophen, simvastatin, losartan, alendronate, aspirin, and calcium-vitamin d.  Elzabeth is allergic to lipitor [atorvastatin].      Medications and allergies were reviewed and reconciled with the patient in eRecord today; and No changes were made.      Review of Systems   Reason unable to perform ROS: Per HPI.   Constitutional: Negative for malaise/fatigue.   Respiratory: Negative for cough.          Objective:    Vitals:    06/01/16 0918   BP: 120/70   Pulse: 70   Weight: 65.3 kg (144 lb)   Height: 1.6 m (5\' 3" )     Physical Exam   Constitutional: She is oriented to person, place, and time and well-developed, well-nourished, and in no distress.   Eyes: Pupils are equal, round, and reactive to light.   Neck: Normal range of motion.   Cardiovascular: Normal rate and regular rhythm.    Pulmonary/Chest: Effort normal.   Musculoskeletal: Normal range of motion.   Neurological: She is alert and oriented to person, place, and time.   Skin: Skin is warm and dry.   Psychiatric: Mood, affect and judgment normal.       Assessment/Plan:  Averey was seen today for hypertension and shoulder pain.    Diagnoses and all orders for this visit:    Hyperlipidemia, unspecified hyperlipidemia type  -     Comprehensive metabolic panel; Future  -     Lipid add Rfx to Drt LDL if Trig >400; Future    Hypertension, unspecified  type     she will continue her current regimen.  We discussed at great length opportunities for adjusting her exercise and her diet including initiating a fasting day episodically,  adding some resistance work to her regimen I also provided her with some teaching regarding core strengthening.     she'll follow-up in 4-6 months with a recheck of her lipid profile.    Return in about 6 months (around 12/01/2016), or if symptoms worsen or fail to improve.    Romualdo Bolk FNP-C

## 2016-06-11 ENCOUNTER — Encounter: Payer: Self-pay | Admitting: Primary Care

## 2016-06-11 ENCOUNTER — Ambulatory Visit: Payer: Medicare (Managed Care) | Attending: Primary Care | Admitting: Primary Care

## 2016-06-11 VITALS — BP 114/74 | HR 73 | Ht 63.07 in | Wt 142.0 lb

## 2016-06-11 DIAGNOSIS — M810 Age-related osteoporosis without current pathological fracture: Secondary | ICD-10-CM

## 2016-06-11 DIAGNOSIS — I10 Essential (primary) hypertension: Secondary | ICD-10-CM

## 2016-06-11 DIAGNOSIS — Z87891 Personal history of nicotine dependence: Secondary | ICD-10-CM

## 2016-06-11 DIAGNOSIS — Z23 Encounter for immunization: Secondary | ICD-10-CM

## 2016-06-11 DIAGNOSIS — Z789 Other specified health status: Secondary | ICD-10-CM | POA: Insufficient documentation

## 2016-06-11 DIAGNOSIS — L719 Rosacea, unspecified: Secondary | ICD-10-CM

## 2016-06-11 DIAGNOSIS — Z972 Presence of dental prosthetic device (complete) (partial): Secondary | ICD-10-CM | POA: Insufficient documentation

## 2016-06-11 DIAGNOSIS — Z0000 Encounter for general adult medical examination without abnormal findings: Secondary | ICD-10-CM

## 2016-06-11 HISTORY — DX: Essential (primary) hypertension: I10

## 2016-06-11 MED ORDER — METRONIDAZOLE 0.75 % EX GEL *I*
Freq: Two times a day (BID) | CUTANEOUS | 5 refills | Status: DC
Start: 2016-06-11 — End: 2016-09-07

## 2016-06-11 NOTE — H&P (Signed)
Physical Exam Visit    [name] is 76 y.o. female presenting for a full physical exam.     Specific Concern's today also include:    1) Facial rash   - 3-4 months  - across cheeks   - her sister has as well - rosacea   - no over fatigue, night sweats, weight loss, - suffers from chronic back pain     2) White lesions on labia     3) Right breast   - describes soreness through the lateral aspect  - has been 3-4 months   - describes it as a pressure - from the nipple laterally  when she lifts her arm up or reaches back  - denies nipple discharge   ~~~    Patient has not been hospitalized in the past year.    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    Essential hypertension I10    Wears dentures Z97.2, K08.109    Patient has healthcare proxy Z78.9       Current Outpatient Prescriptions   Medication Sig    acetaminophen (TYLENOL) 500 mg tablet Take 1,000 mg by mouth at bedtime    simvastatin (ZOCOR) 40 MG tablet Take 1 tablet (40 mg total) by mouth daily (with dinner)    losartan (COZAAR) 50 MG tablet Take 1 tablet (50 mg total) by mouth daily    alendronate (FOSAMAX) 70 MG tablet TAKE 1 TABLET BY MOUTH ONCE EVERY 7 DAYS TAKE WITH A FULL GLASS OF WATER DO NOT EAT OR LIE DOWN FOR 30 MINUTES    aspirin 81 MG tablet Take 1 tablet (81 mg total) by mouth daily    calcium-vitamin D (OSCAL-500) 500-200 MG-UNIT per tablet Take 2 tablets by mouth 2 times daily    metroNIDAZOLE (METROGEL) 0.75 % gel Apply topically 2  times daily   To face       Allergies   Allergen Reactions    Lipitor [Atorvastatin] Other (See Comments)     Elevated LFTs       Past Medical History:   Diagnosis Date    AAA (abdominal aortic aneurysm)     Elevated LFTs     Unclear if this was related to statin, gallstones, alcohol consumption     Essential hypertension 06/11/2016    GERD (gastroesophageal reflux disease)     HLD (hyperlipidemia)     Hx of colonic polyps     Colonscopy 08/11/10; repeat 2017 per GI     Lumbar disc herniation 2006    L5-S1    Osteoporosis     Spondylosis     Urinary retention        Past Surgical History:   Procedure Laterality Date    HYSTERECTOMY      left ovaries in place     TUBAL LIGATION  Social History     Social History    Marital status: Divorced     Spouse name: N/A    Number of children: N/A    Years of education: N/A     Occupational History    Not on file.     Social History Main Topics    Smoking status: Former Smoker     Packs/day: 1.00     Years: 30.00     Types: Cigarettes     Quit date: 06/15/2013    Smokeless tobacco: Never Used    Alcohol use 5.4 oz/week     9 Glasses of wine per week    Drug use: No    Sexual activity: Not Currently     Social History Narrative    Lives alone currently in townhome     2 brothers and 2 sisters that live here    2 boys ([place], [place])    [place]          Family History   Problem Relation Age of Onset    Dementia Mother 14    Cancer Mother      lung    High cholesterol Mother     Stroke Mother     Parkinsonism Father     Heart Disease Father 46     passed    High cholesterol Father     Heart attack Father     Cancer Sister      Breast; in recovery    High cholesterol Sister     Hip fracture Sister     Cancer Brother      prostate    High cholesterol Brother     Diabetes Brother     Aneurysm Neg Hx          Domestic Violence Screening   Negative  [x]      Positive - Intimate Partner  []  Other  []  Remote Hx  []    Depression  Screening   Little Interest or Pleasure in Activities?  -  Not at all  [x]                                                 Several days   []   More then half of days  []  Nearly every day  []     Feeling down, depressed or hopeless? - Not at all  [x]                                               Several days   []   More then half of days  []  Nearly every day  []    Loss of loved one in past year?  No  [x]    Yes   []              Aging Concerns   Trouble Hearing - No  [x]  Yes  []  Details:   Memory Concerns - No  [x]  Yes  []  Details:   Fall in past year - No  [x]  Yes  []  Details:   Worsening Balance - No  [x]  Yes  []  Details:   Environmental Safety    Parameter Yes No Sometimes NA Details   Environmental Exposure  x  Sunscreen x       Seatbelt x       Cycling Helmet    x    Smoke detectors x       CO detectors x       Firearms   x        Health Care Maintenance   Parameter UTD Need NA Declined Comment   Dental Cleaning  x   Counseled   Vision Exam x       CRC screen  x       Dexa (F65+M70+)  x      Mammogram (F40-75) x       Pap (F21-65)   x     Abd [place] (M65+)   x     Screening CT  >55 30pk yr  x      EKG  x      Lipid Panel  x       HbA1c   x     STD screen        Hepatitis C   x     Tdap  x      Prevnar  x       Pneumovax x       Shingrex  x        BP 114/74   Pulse 73   Ht 1.602 m (5' 3.07")   Wt 64.4 kg (142 lb)   SpO2 99%   BMI 25.1 kg/m2    GEN: Alert, pleasant well adult in NAD.   HEENT: Normocephalic and atraumatic.PERRL. Moist mucous membranes. TM's clear BL, no pharyngeal erythema - dentures  NECK: Supple, no lymphadenopathy or thyromegaly   PULM: Easy respirations, well aerated, CTA bilaterally.   CVS: RRR, no murmur, normal S1& S2. Equal and strong radial pulses.   ABD:  soft, nontender, nondistended, no hepatosplenomegaly, no masses.   SKIN: wearing make up unable to assess facial erythema/rash - see below for G/U  NEURO: steady gait, biceps and patellar reflexes +2/4 equal bilaterally   Ext: 5/5 muscle  strength in all extremities, no clubbing, edema or cyanosis     Breast: Breasts symmetric and smooth without masses. Nipples without discharge. No axillary lymphadenopathy   G/U:  External Genitalia without erythema, along the left labia lateral to the introitus there are thee slightly small then pea sized white raised papules one of which appears to have a central plug       EKG - NSR rate 60, axis -30, LVH, artifact in V6, no ST changes     Assessment/Plan   1. Complete Physical Exam    2. Rosacea   - trial of Metrogel, she is following up on derm  - difficult to assess today d/t make up, could consider if this is a malar rash if other symptoms develop, ROS negative today and family hx of Rosacea     3. Labial lesions  - this look like blocked sebaceous glands today, I suggested she ask dermatology to view the area     4. Breast pain  - by Hx and exam seems more musculoskeletal  (eg pectoralis muscle) but I did order a right diagnostic mammogram as a precaution     5. Osteoporosis  - we discussed her 40 Dexa, I recommended she continue Fosamax and get a repeat Dexa  - Vit D, PTH with next labs     We had a shared decision making discussion about low dose screening CT - she will consider  this further and make have questions about this in the future before scheduling.      Care Planning    Health Care Proxy:  Carola Rhine (son), Wallene Huh (sister)   Paperwork as part of Record? []   Yes   []    No  []   Awaiting return of forms  Living will completed ?  []   Yes   []    No     Discussed / Health Education   x Parameter   x Importance of recommended health screenings and timing of stopping    x Importance of recommended vaccines    x Cholesterol and other lab values    PSA/Prostate Cancer    Appropriateness of ASA for primary prevention    x Bone Health/Calcium and Vitamin D   x Appropriate Weight/Weight loss goals    Regular Exercise    Sleep   x Dental/Vision/Hearing    Sun exposure/Sunscreen use     Seatbelts/Helments/Saftey other     Safe alcohol use    Smoking Cessation   x Healthcare proxy     Living Will    Code status    Organ Donation     Stress/Family issue    Caregiver Stress     Domestic Violence    Sexual issues    Other:        Orders   x Test    BMP/CMP    Lipid Panel     CBC    HbA1c    TSH   x Vitamin D   x PTH    HIV    Hepatitis C    Colonoscopy    Cologuard referral     Mammogram    DEXA    AGY visit    Screening CT for lung CA    AAA ultrasound             Immunizations   x TdaP    Prevnar    PneumoVax    Shingrex - discussed, pt states not covered on her insurance     Flu    Other:    Referrals   x Parameter               Patient Instructions   Your next aortic ultrasound to monitor your aneurysm is due in September     You need to schedule a Dexa scan (bone density test), a follow up colonoscopy, and a dermatology appointment.     I have ordered the screening CT for early detection of lung cancer for persons who either smoke or have recently quit. You can decide if you want to do this.    You are due for your mammogram this summer. I have put special orders in based on the pain you were having in your right breast.     There is lab work ordered to follow up on your bone health (including a Vitamin D level). You can go now and have it done or if you want wait until your next regular lab work in six months.     Please bring your filled out health care proxy packet back the next time you come to the office so we can scan it into your chart.         Return next regular apt with Veleta Miners NP        Roy Cabrini Ruggieri DO

## 2016-06-11 NOTE — Patient Instructions (Addendum)
Your next aortic ultrasound to monitor your aneurysm is due in September     You need to schedule a Dexa scan (bone density test), a follow up colonoscopy, and a dermatology appointment.     I have ordered the screening CT for early detection of lung cancer for persons who either smoke or have recently quit. You can decide if you want to do this.    You are due for your mammogram this summer. I have put special orders in based on the pain you were having in your right breast.     There is lab work ordered to follow up on your bone health (including a Vitamin D level). You can go now and have it done or if you want wait until your next regular lab work in six months.     Please bring your filled out health care proxy packet back the next time you come to the office so we can scan it into your chart.

## 2016-07-25 ENCOUNTER — Encounter: Payer: Self-pay | Admitting: Radiology

## 2016-07-30 ENCOUNTER — Telehealth: Payer: Self-pay | Admitting: Primary Care

## 2016-07-30 ENCOUNTER — Other Ambulatory Visit: Payer: Self-pay | Admitting: Primary Care

## 2016-07-30 DIAGNOSIS — N644 Mastodynia: Secondary | ICD-10-CM

## 2016-07-30 NOTE — Telephone Encounter (Signed)
Pt went to schedule  her mammogram today but reported  pressure/pain in R breast. [facility] is requiring Bilateral Diagnostic mammogram and R ultrasound order, so the patient can schedule her appt. Fax # [phone], Attn: Raquel.

## 2016-07-30 NOTE — Telephone Encounter (Signed)
Orders faxed to [phone]

## 2016-08-27 ENCOUNTER — Ambulatory Visit
Admission: RE | Admit: 2016-08-27 | Discharge: 2016-08-27 | Disposition: A | Discharge status: Home / Self Care | Payer: Medicare (Managed Care) | Source: Ambulatory Visit | Attending: Primary Care | Admitting: Primary Care

## 2016-08-27 ENCOUNTER — Ambulatory Visit: Payer: Medicare (Managed Care) | Attending: Primary Care

## 2016-08-27 ENCOUNTER — Other Ambulatory Visit: Payer: Self-pay | Admitting: Primary Care

## 2016-08-27 ENCOUNTER — Ambulatory Visit
Admission: RE | Admit: 2016-08-27 | Discharge: 2016-08-27 | Disposition: A | Discharge status: Home / Self Care | Payer: Medicare (Managed Care) | Source: Ambulatory Visit

## 2016-08-27 DIAGNOSIS — Z7983 Long term (current) use of bisphosphonates: Secondary | ICD-10-CM

## 2016-08-27 DIAGNOSIS — R928 Other abnormal and inconclusive findings on diagnostic imaging of breast: Secondary | ICD-10-CM

## 2016-08-27 DIAGNOSIS — Z780 Asymptomatic menopausal state: Secondary | ICD-10-CM

## 2016-08-27 DIAGNOSIS — N644 Mastodynia: Secondary | ICD-10-CM

## 2016-08-27 DIAGNOSIS — M810 Age-related osteoporosis without current pathological fracture: Secondary | ICD-10-CM

## 2016-08-27 DIAGNOSIS — Z0000 Encounter for general adult medical examination without abnormal findings: Secondary | ICD-10-CM

## 2016-08-27 LAB — HM DEXA SCAN

## 2016-08-29 NOTE — Procedures (Signed)
Providers: DXA scan for your review.  Images and report are located under the imaging and/or media tab.  For Mychart active patients, please contact your referring provider for results and/or for  requests for a copy of this result.

## 2016-08-30 ENCOUNTER — Encounter: Payer: Self-pay | Admitting: Primary Care

## 2016-08-31 ENCOUNTER — Encounter: Payer: Self-pay | Admitting: Primary Care

## 2016-09-04 ENCOUNTER — Encounter: Payer: Self-pay | Admitting: Dermatology

## 2016-09-07 ENCOUNTER — Encounter: Payer: Self-pay | Admitting: Dermatology

## 2016-09-07 ENCOUNTER — Ambulatory Visit: Payer: Medicare (Managed Care) | Attending: Dermatology | Admitting: Dermatology

## 2016-09-07 VITALS — BP 142/90 | HR 73 | Ht 63.0 in | Wt 143.0 lb

## 2016-09-07 DIAGNOSIS — L821 Other seborrheic keratosis: Secondary | ICD-10-CM

## 2016-09-07 DIAGNOSIS — L728 Other follicular cysts of the skin and subcutaneous tissue: Secondary | ICD-10-CM

## 2016-09-07 DIAGNOSIS — D229 Melanocytic nevi, unspecified: Secondary | ICD-10-CM

## 2016-09-07 NOTE — Patient Instructions (Signed)
Steatocystoma simplex

## 2016-09-07 NOTE — Progress Notes (Addendum)
Referring Provider: No ref. provider found   PCP: Pilar Plate, MD     Chief complaint: New Patient Visit (vulva rash and skin check)    Derm History: none     HPI:   [name] is a pleasant 76 y.o. female here for the concern noted above.    Problem: white spots  Location: vulva   Duration: months  Associated signs/symptoms: no burning, itching, painful   Modifying factors (prior treatments/current treatment): none     She would also like [place] to evaluate some spots on her face and back. Nothing is painful or bleeding. No new moles of concern.     Personal hx of skin cancer: no   Family hx of skin cancer: no  Social History: no  No flowsheet data found.   Smoking History:  reports that she quit smoking about 3 years ago. Her smoking use included Cigarettes. She has a 30.00 pack-year smoking history. She has never used smokeless tobacco.    ROS:   No fever chills  No new moles     Physical Exam:    Vitals:    09/07/16 1050   BP: 142/90   Pulse: 73   Weight: 64.9 kg (143 lb)   Height: 1.6 m (5\' 3" )      Pain    09/07/16 1050   PainSc:   0 - No pain      Constitutional/General: Alert and oriented,  NAD.   Skin: The head/face, neck, chest, abdomen, back, RUE, LUE, RLE, LLE, buttocks/groin were examined and were within normal limits, except as noted:   Left vulva there are 5 skin-colored to yellowish dermal cystic papules ranging from 37mm-7mm and 1 similar appearing on the left vulva  Scattered across the trunk and extremities are multiple well defined, symmetric, light to dark brown macules and papules. All of them have a regular pigment network on dermoscopy. There are no atypical features.  Scattered on the trunk and extremities are multiple stuck on appearing brown papules and plaques c/w seborrheic Keratoses.    Assessment/Plan:     1. Steatocystoma multiplex  -- Benign condition discussed with patient.  - No further treatment indicated at this time. Treatment options discussed, such as the option of incision  and drainage.   - Reassurance provided.This is a benign condition of sebaceous glands and is very common. This is NOT concerning for malignancy.   -This is not bothering patient recommended no intervention. If becomes symptomatic in the future, please call for a follow up and we can discuss treatment options.    2. Nevi, benign  Monitor moles for the following changes:   A: asymmetry  B: border changes  C: color changes (Darker, Multicolored, Whiter)  D: diameter greater than a pencil eraser size   E: evolving, or changing mole     Recommend wearing hats and sun protective clothing or sunscreen (SPF 30+, broad band) daily on your face and entire body (apply sunscreen atleast 30 minutes prior to going outside). Reapply sunscreen every 2 hours when outside.    3. Seborrheic Keratoses  - Benign condition discussed with patient  - No further treatment indicated at this time      Barriers to learning: none     Return to Clinic: prn    Dr. Eleanora Neighbor, PGY-2  Dermatology Resident    Seen with Dr. Joie Bimler.      I saw and evaluated the patient with resident/NP. I agree with the findings and plan  of care as documented above.   Adama Ivins Gail Osric Klopf, MD

## 2016-09-14 ENCOUNTER — Ambulatory Visit
Admission: RE | Admit: 2016-09-14 | Discharge: 2016-09-14 | Disposition: A | Discharge status: Home / Self Care | Payer: Medicare (Managed Care) | Source: Ambulatory Visit | Attending: Vascular Surgery | Admitting: Vascular Surgery

## 2016-09-14 DIAGNOSIS — I714 Abdominal aortic aneurysm, without rupture, unspecified: Secondary | ICD-10-CM

## 2016-09-17 LAB — CV DOPPLER AORTA COMPLETE
Aorta Diameter A-P Distal: 4.91 cm
Aorta Diameter A-P Mid: 3.72 cm
Aorta Diameter A-P Prox: 3.62 cm
Aorta Diameter Trans Distal: 5.16 cm
Aorta Diameter Trans Mid: 3.96 cm
Aorta Diameter Trans Prox: 3.37 cm
Aorta EDV Distal: 0 cm/s
Aorta EDV Mid: 8.9 cm/s
Aorta EDV Prox: 17.45 cm/s
Aorta PSV Distal: 28.09 cm/s
Aorta PSV Mid: 24.6 cm/s
Aorta PSV Prox: 64.13 cm/s
Left Common Iliac Artery Prox AP Diameter: 0.65 cm
Left Common Iliac Artery Prox EDV: 5 cm/s
Left Common Iliac Artery Prox PSV: 219 cm/s
Left Common Iliac Artery Prox Trans Diameter: 0.82 cm
Right Common Iliac Artery Prox AP Diameter: 0.85 cm
Right Common Iliac Artery Prox EDV: 7 cm/s
Right Common Iliac Artery Prox PSV: 140 cm/s
Right Common Iliac Artery Prox Trans Diameter: 0.93 cm
Suprarenal Aorta EDV: 17 cm/s
Suprarenal Aorta PSV: 64 cm/s

## 2016-09-21 ENCOUNTER — Encounter: Payer: Self-pay | Admitting: Vascular Surgery

## 2016-09-21 ENCOUNTER — Ambulatory Visit
Admission: RE | Admit: 2016-09-21 | Discharge: 2016-09-21 | Disposition: A | Discharge status: Home / Self Care | Payer: Medicare (Managed Care) | Source: Ambulatory Visit | Attending: Vascular Surgery | Admitting: Vascular Surgery

## 2016-09-21 ENCOUNTER — Ambulatory Visit: Payer: Medicare (Managed Care) | Admitting: Vascular Surgery

## 2016-09-21 VITALS — BP 140/70 | Ht 63.0 in | Wt 143.0 lb

## 2016-09-21 DIAGNOSIS — I714 Abdominal aortic aneurysm, without rupture, unspecified: Secondary | ICD-10-CM

## 2016-09-21 DIAGNOSIS — I6523 Occlusion and stenosis of bilateral carotid arteries: Secondary | ICD-10-CM | POA: Insufficient documentation

## 2016-09-21 NOTE — Progress Notes (Signed)
Vascular Surgery Outpatient Clinic Follow-Up Visit    HPI:     [name] is a 76 y.o. female history of known Aortic Abdominal Aneurysm here for routine follow-up and surveillance of her 4.7 cm AAA. PMH significant for HLD, chronic back pain, Sciatica, herniated discs, GERD. Smoking history: former smoker, quit in 2015. No FHx significant for aneurysmal disease. The patient notes a history of chronic back pain and left shoulder pain which causes a great deal of chronic pain problems. The patient notes a history of pain in her legs which is not necessarily associated with walking and has chronic pain in her back at night.       Vascular risk factors:   Age: 76 y.o.  Female gender: no  Diabetes mellitus: no  Obesity: no, There is no height or weight on file to calculate BMI.  Hypertension: yes  Hyperlipidemia: yes  Tobacco abuse: yes, 10 years  Family history: no  VQI Info:  Occupation: Retired  Ambulatory status: Ambulates  Current living status: Lives at home Comorbidities:  Coronary artery disease: no  Renal disease: no  Lung disease: no,   Arrhythmia: no  Prior CVA: no  CHF: no  Prior PCI: no  Prior open heart surgery: no  Liver disease: no  Coagulopathy: no     REVIEW OF SYSTEMS  Review of Systems   Constitutional: Negative.    HENT: Negative.    Eyes: Negative.    Respiratory: Negative.    Cardiovascular: Negative.    Gastrointestinal: Negative.    Genitourinary: Negative.    Musculoskeletal: Negative.    Skin: Negative.    Neurological: Negative.    Endo/Heme/Allergies: Negative.    Psychiatric/Behavioral: Negative.        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    Osteoporosis     Spondylosis     Urinary retention        SURGICAL HISTORY  Past Surgical History:   Procedure Laterality Date    COLONOSCOPY  2011    HYSTERECTOMY      left ovaries in place     TUBAL LIGATION         FAMILY HISTORY  Family History   Problem Relation Age of Onset    Dementia Mother 22    Cancer Mother      lung    High cholesterol Mother     Stroke Mother     Parkinsonism Father     Heart Disease Father 57     passed    High cholesterol Father     Heart attack Father     Cancer Sister      Breast; in recovery    High cholesterol Sister     Hip fracture Sister     Breast cancer Sister 41    Cancer Brother      prostate    High cholesterol Brother     Diabetes Brother     Aneurysm Neg Hx         SOCIAL  HISTORY   reports that she quit smoking about 3 years ago. Her smoking use included Cigarettes. She has a 30.00 pack-year smoking history. She has never used smokeless tobacco. She reports that she drinks about 5.4 oz of alcohol per week  She reports that she does not use illicit drugs.     ALLERGIES  Lipitor [atorvastatin]     MEDICATIONS  Current Outpatient Prescriptions   Medication Sig    acetaminophen (TYLENOL) 500 mg tablet Take 1,000 mg by mouth at bedtime    simvastatin (ZOCOR) 40 MG tablet Take 1 tablet (40 mg total) by mouth daily (with dinner)    losartan (COZAAR) 50 MG tablet Take 1 tablet (50 mg total) by mouth daily    alendronate (FOSAMAX) 70 MG tablet TAKE 1 TABLET BY MOUTH ONCE EVERY 7 DAYS TAKE WITH A FULL GLASS OF WATER DO NOT EAT OR LIE DOWN FOR 30 MINUTES    aspirin 81 MG tablet Take 1 tablet (81 mg total) by mouth daily    calcium-vitamin D (OSCAL-500) 500-200 MG-UNIT per tablet Take 2 tablets by mouth 2 times daily     No current facility-administered medications for this visit.         Objective:      Vitals:         Imaging:  Interpretation Summary   History: Follow up AAA  Prior: 03/16/16    1. Abdominal aorta is aneurysmal with a  maximum diameter of 5.16 cm (previously 4.77 cm), and normal bilateral iliac arteries.    2. Mural thrombus noted within the aneurysmal sac.         Physical Exam:      General appearance: healthy, alert, active, no distress and cooperative  HEENT: PERRLA  CV: regular rate and rhythm, S1, S2 normal, no murmur, click, rub or gallop  Lungs:clear  Abd: soft, non-tender. Bowel sounds normal. No masses,  no organomegaly. Large pulsatile mass   Extremities: Palpable AAA, femoral, DP/PT pulses  Neuro: normal without focal findings, mental status, speech normal, alert and oriented x3, PERLA and reflexes normal and symmetric    Assessment:   [name] is a 76 y.o. female with AAA which has increased in size to 5.1 cm from 4.7 cm.      Plan:   -I had a long discussion with the patient about the possible growth of her aneurysms and that the next step would require a CT scan to define the size and possible options for repair. We will also obtain a BMP today in the office to ensure her GFR is safe for IV contrast. The patient will follow-up in the office after her CT scan to discusses possible operative interventions.  -Given her likely need for AAA repair I will also obtain an stress echo as well as a bilateral carotid ultrasound.  -The patient was advised to go to the ED with any unexplained back or abdominal pain.  -This patient was seen and examined with Dr. Melony Overly.    Alease Medina, MD  09/21/2016 at 10:14 AM

## 2016-09-24 LAB — CV US CAROTID BILATERAL
Left Carotid Bulb EDV: 11.21 cm/s
Left Carotid Bulb PSV: 37.58 cm/s
Left Common Carotid Artery EDV Dist: 14.17 cm/s
Left Common Carotid Artery EDV Prox: 11.98 cm/s
Left Common Carotid Artery PSV Dist: 61.42 cm/s
Left Common Carotid Artery PSV Prox: 39.44 cm/s
Left External Carotid Artery EDV: 9.26 cm/s
Left External Carotid Artery PSV: 61.59 cm/s
Left ICA/CCA Ratio: 0.73
Left Internal Carotid Artery EDV Dist: 26.7 cm/s
Left Internal Carotid Artery EDV Mid: 30.73 cm/s
Left Internal Carotid Artery EDV Prox: 16.76 cm/s
Left Internal Carotid Artery PSV Dist: 68.3 cm/s
Left Internal Carotid Artery PSV Mid: 76.35 cm/s
Left Internal Carotid Artery PSV Prox: 44.69 cm/s
Left Subclavian Artery EDV: 0 cm/s
Left Subclavian Artery PSV: 132.36 cm/s
Left Vertebral Artery EDV: 15.8 cm/s
Left Vertebral Artery PSV: 49.46 cm/s
Right Carotid Bulb EDV: 14.86 cm/s
Right Carotid Bulb PSV: 43.93 cm/s
Right Common Carotid Artery EDV Dist: 14.86 cm/s
Right Common Carotid Artery EDV Prox: 13.24 cm/s
Right Common Carotid Artery PSV Dist: 52.01 cm/s
Right Common Carotid Artery PSV Prox: 64.93 cm/s
Right External Carotid Artery EDV: 8.93 cm/s
Right External Carotid Artery PSV: 65.91 cm/s
Right ICA/CCA Ratio: 0.88
Right Internal Carotid Artery EDV Dist: 36.09 cm/s
Right Internal Carotid Artery EDV Mid: 24.55 cm/s
Right Internal Carotid Artery EDV Prox: 14.86 cm/s
Right Internal Carotid Artery PSV Dist: 88.62 cm/s
Right Internal Carotid Artery PSV Mid: 68.16 cm/s
Right Internal Carotid Artery PSV Prox: 45.55 cm/s
Right Subclavian Artery EDV: 0 cm/s
Right Subclavian Artery PSV: 153.43 cm/s
Right Vertebral Artery EDV: 15.8 cm/s
Right Vertebral Artery PSV: 45.58 cm/s

## 2016-09-27 ENCOUNTER — Other Ambulatory Visit
Admission: RE | Admit: 2016-09-27 | Discharge: 2016-09-27 | Disposition: A | Discharge status: Home / Self Care | Payer: Medicare (Managed Care) | Source: Ambulatory Visit | Attending: Primary Care | Admitting: Primary Care

## 2016-09-27 DIAGNOSIS — M810 Age-related osteoporosis without current pathological fracture: Secondary | ICD-10-CM | POA: Insufficient documentation

## 2016-09-27 DIAGNOSIS — E785 Hyperlipidemia, unspecified: Secondary | ICD-10-CM

## 2016-09-27 LAB — COMPREHENSIVE METABOLIC PANEL
ALT: 16 U/L (ref 0–35)
AST: 21 U/L (ref 0–35)
Albumin: 4.6 g/dL (ref 3.5–5.2)
Alk Phos: 64 U/L (ref 35–105)
Anion Gap: 13 (ref 7–16)
Bilirubin,Total: 0.4 mg/dL (ref 0.0–1.2)
CO2: 27 mmol/L (ref 20–28)
Calcium: 9.8 mg/dL (ref 8.6–10.2)
Chloride: 99 mmol/L (ref 96–108)
Creatinine: 0.8 mg/dL (ref 0.51–0.95)
GFR,Black: 82 *
GFR,Caucasian: 71 *
Glucose: 98 mg/dL (ref 60–99)
Lab: 11 mg/dL (ref 6–20)
Potassium: 4.1 mmol/L (ref 3.3–5.1)
Sodium: 139 mmol/L (ref 133–145)
Total Protein: 7.1 g/dL (ref 6.3–7.7)

## 2016-09-27 LAB — PTH, INTACT: Intact PTH: 37.3 pg/mL (ref 15.0–65.0)

## 2016-09-27 LAB — LIPID PANEL
Chol/HDL Ratio: 2.9
Cholesterol: 200 mg/dL — AB
HDL: 70 mg/dL
LDL Calculated: 109 mg/dL
Non HDL Cholesterol: 130 mg/dL
Triglycerides: 105 mg/dL

## 2016-09-27 LAB — MULTIPLE ORDERING DOCS

## 2016-09-27 NOTE — Progress Notes (Signed)
Review results at next visit

## 2016-10-01 ENCOUNTER — Telehealth: Payer: Self-pay | Admitting: Primary Care

## 2016-10-01 LAB — VITAMIN D
25-OH VIT D2: <4 ng/mL
25-OH VIT D3: 79 ng/mL
25-OH Vit Total: 79 ng/mL — ABNORMAL HIGH (ref 30–60)

## 2016-10-01 NOTE — Telephone Encounter (Signed)
Spoke with pt and relayed message from Dr Campbell Stall. Pt stated that she has been taking 5000 units of Vitamin D daily. She will stop taking that for a few days and will pick up a lower dose, no more than 2000 units, when she is able to get to the store. Pt does not report any side effects of the high Vitamin D level.

## 2016-10-01 NOTE — Telephone Encounter (Signed)
Her Vitamin D level came back high. Please call her and see what she is taking. We need to decrease what she is taking.

## 2016-10-03 ENCOUNTER — Encounter: Payer: Self-pay | Admitting: Family Medicine

## 2016-10-03 ENCOUNTER — Telehealth: Payer: Self-pay

## 2016-10-03 ENCOUNTER — Ambulatory Visit
Admission: RE | Admit: 2016-10-03 | Discharge: 2016-10-03 | Disposition: A | Discharge status: Home / Self Care | Payer: Medicare (Managed Care) | Source: Ambulatory Visit | Attending: Cardiology | Admitting: Cardiology

## 2016-10-03 DIAGNOSIS — I10 Essential (primary) hypertension: Secondary | ICD-10-CM

## 2016-10-03 DIAGNOSIS — I714 Abdominal aortic aneurysm, without rupture, unspecified: Secondary | ICD-10-CM

## 2016-10-03 LAB — DOBUTAMINE STRESS ECHO COMPLETE
Aortic Arch Diameter: 2.3 cm
Aortic Diameter (mid tubular): 3.5 cm
Aortic Diameter (sinus of Valsalva): 3.2 cm
BMI: 24.8 kg/m2
BP Diastolic: 92 mmHg
BP Systolic: 140 mmHg
BSA: 1.72 m2
E/A ratio: 0.66
Heart Rate: 85 {beats}/min
Height: 64 in
LA Systolic Diameter: 2.78 cm
LA Systolic Vol BSA Index: 16.3 mL/m2
LA Systolic Vol Height Index: 17.2 mL/m
LA Systolic Volume: 28 mL
LV ASE Mass BSA Index: 88 gm/m2
LV ASE Mass Height 2.7 Index: 40.7 gm/m2.7
LV ASE Mass Height Index: 93.1 gm/m
LV ASE Mass: 151.3 gm
LV Posterior Wall Thickness: 1.1 cm
LV Septal Thickness: 1.1 cm
LVED Diameter BSA Index: 2.4 cm/m2
LVED Diameter Height Index: 2.5 cm/m
LVED Diameter: 4.1 cm
LVES Diameter BSA Index: 1.4 cm/m2
LVES Diameter Height Index: 1.5 cm/m
LVES Diameter: 2.4 cm
LVOT Area (calculated): 2.14 cm2
LVOT Cardiac Index: 2.41 L/min/m2
LVOT Cardiac Output: 4.14 L/min
LVOT Diameter: 1.65 cm
LVOT PWD VTI: 22.8 cm
LVOT PWD Velocity (mean): 75.7 cm/s
LVOT PWD Velocity (peak): 107 cm/s
LVOT SV BSA Index: 28.33 mL/m2
LVOT SV Height Index: 30 mL/m
LVOT Stroke Rate (mean): 161.8 mL/s
LVOT Stroke Rate (peak): 228.7 mL/s
LVOT Stroke Volume: 48.73 cc
MPHR: 143 {beats}/min
MV Peak A Velocity: 87.8 cm/s
MV Peak E Velocity: 58.3 cm/s
Mitral Annular E/Ea Vel Ratio: 12.96
Mitral Annular Ea Velocity: 4.5 cm/s
Peak Gradient - TR: 26 mmHg
Peak HR: 136 {beats}/min
Percent MPHR: 95.1 %
RA Pressure Estimate: 3 mmHg
RR Interval: 705.88 ms
RV Peak Systolic Pressure: 29 mmHg
Weight (lbs): 144 [lb_av]
Weight: 2304 oz

## 2016-10-03 MED ORDER — SODIUM CHLORIDE 0.9 % INJ (FLUSH) WRAPPED *I*
10.0000 mL | Status: AC | PRN
Start: 2016-10-03 — End: 2016-10-03
  Administered 2016-10-03: 10 mL via INTRAVENOUS

## 2016-10-03 MED ORDER — DOBUTAMINE 4 MG/ML IN D5W IV SOLN (ADULT STANDARD) *I*
5.0000 ug/kg/min | INTRAVENOUS | Status: DC
Start: 2016-10-03 — End: 2016-10-04
  Administered 2016-10-03: 30 ug/kg/min via INTRAVENOUS

## 2016-10-03 MED ORDER — ATROPINE SULFATE 0.1 MG/ML IJ SOLN *I*
0.5000 mg | INTRAMUSCULAR | Status: DC | PRN
Start: 2016-10-03 — End: 2016-10-04

## 2016-10-03 MED ORDER — PERFLUTREN LIPID MICROSPHERE 1.1 MG/ML (DEFINITY) IV SUSP *I*
1.3000 mL | INTRAVENOUS | Status: DC | PRN
Start: 2016-10-03 — End: 2016-10-04

## 2016-10-03 NOTE — Progress Notes (Signed)
sent patient an email regarding results

## 2016-10-03 NOTE — Telephone Encounter (Signed)
S/w pt -- reviewed dobutamine instructions.  Will fast 4 hrs prior.  Meds/allergies verified.

## 2016-10-12 ENCOUNTER — Ambulatory Visit: Payer: Medicare (Managed Care) | Admitting: Vascular Surgery

## 2016-10-12 ENCOUNTER — Ambulatory Visit
Admission: RE | Admit: 2016-10-12 | Discharge: 2016-10-12 | Disposition: A | Discharge status: Home / Self Care | Payer: Medicare (Managed Care) | Source: Ambulatory Visit

## 2016-10-12 DIAGNOSIS — I714 Abdominal aortic aneurysm, without rupture, unspecified: Secondary | ICD-10-CM

## 2016-10-12 MED ORDER — IOHEXOL 350 MG/ML (OMNIPAQUE) IV SOLN *I*
1.0000 mL | Freq: Once | INTRAVENOUS | Status: AC
Start: 2016-10-12 — End: 2016-10-12
  Administered 2016-10-12: 50 mL via INTRAVENOUS

## 2016-10-15 ENCOUNTER — Telehealth: Payer: Self-pay | Admitting: Surgery

## 2016-10-15 ENCOUNTER — Other Ambulatory Visit: Payer: Self-pay | Admitting: Surgery

## 2016-10-15 ENCOUNTER — Telehealth: Payer: Self-pay

## 2016-10-15 DIAGNOSIS — Z01818 Encounter for other preprocedural examination: Secondary | ICD-10-CM

## 2016-10-15 NOTE — Telephone Encounter (Signed)
Patient called because she wants to know the results of her CT from Friday, and is having left sided abdominal pain.

## 2016-10-15 NOTE — Telephone Encounter (Signed)
Attempted to reach patient- CT reviewed with Dr. Melony Overly, candidate for endovascular repair, given stress test result will refer to Cardiology for pre-op clearance and schedule office visit with Dr. Melony Overly after to discuss surgical plan further including R/B/A. Advised patient to call clinic back.

## 2016-10-18 ENCOUNTER — Other Ambulatory Visit: Payer: Self-pay | Admitting: Family Medicine

## 2016-10-18 DIAGNOSIS — E785 Hyperlipidemia, unspecified: Secondary | ICD-10-CM

## 2016-10-18 MED ORDER — SIMVASTATIN 40 MG PO TABS *I*
40.0000 mg | ORAL_TABLET | Freq: Every day | ORAL | 1 refills | Status: DC
Start: 2016-10-18 — End: 2017-01-23

## 2016-10-23 ENCOUNTER — Encounter: Payer: Self-pay | Admitting: Cardiology

## 2016-10-23 ENCOUNTER — Ambulatory Visit: Payer: Medicare (Managed Care) | Attending: Surgery | Admitting: Cardiology

## 2016-10-23 VITALS — BP 134/86 | HR 74 | Ht 64.0 in | Wt 142.0 lb

## 2016-10-23 DIAGNOSIS — I714 Abdominal aortic aneurysm, without rupture, unspecified: Secondary | ICD-10-CM

## 2016-10-23 DIAGNOSIS — Z01818 Encounter for other preprocedural examination: Secondary | ICD-10-CM

## 2016-10-23 NOTE — Progress Notes (Signed)
Cardiology Office Consult Note    Date of Consult: 10/23/2016 Patient: [name]   Patients PCP: Pilar Plate, MD Patient DOB: 19-Aug-1940     History of Present Illness/Reason For Visit     I had the pleasure of seeing [name] in cardiology consult on 10/23/2016. [name] is an 76 y.o. female who we were asked to see for cardiology clearance prior to endovascular AAA repair.    As you know, she is a history of hypertension, hyperlipidemia, lower back pain, and an abdominal aortic aneurysm which has increased in size from 4.7 to 5.16 cm on recent testing.  She has mild carotid artery disease.    In general she feels okay.  There is no chest pain, shortness of breath, palpitations, or leg swelling.  There's never been syncope.  She does say she gets a little dizzy if she looks up for a prolonged period of time.  She generally is able to walk about a mile daily but does have some back pain and leg pain with that.  Never chest pain.  She tolerates her medicines well.    She was sent to our office for dobutamine stress echocardiogram which was completed on 10/03/16.  Her LV function was normal with normal wall motion.  There were no significant valvular abnormalities with only mild aortic regurgitation.  Dobutamine was infused up to 30 mcg/kg.  She achieved a peak heart rate of 95% of maximum.  There were no significant ST segment changes and frequent PVCs.  There was no evidence of ischemia.  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  There was no gradient at rest.    Past Medical and Surgical History     Past Medical History:   Diagnosis Date    AAA (abdominal aortic aneurysm)     Elevated LFTs     Unclear if this was related to statin, gallstones, alcohol consumption     Essential hypertension 06/11/2016    GERD (gastroesophageal reflux disease)     HLD (hyperlipidemia)     Hx of colonic  polyps     Colonscopy 08/11/10; repeat 2017 per GI     Lumbar disc herniation 2006    L5-S1    Osteoporosis     Spondylosis     Urinary retention      Past Surgical History:   Procedure Laterality Date    COLONOSCOPY  2011    HYSTERECTOMY      left ovaries in place     TUBAL LIGATION         Medications and Allergies     Current Outpatient Prescriptions   Medication Sig    Calcium Carbonate Antacid 600 MG chewable tablet Take 600 mg by mouth    simvastatin (ZOCOR) 40 MG tablet Take 1 tablet (40 mg total) by mouth daily (with dinner)    FLUZONE HIGH-DOSE 0.5 ML SUSY injection TO BE GIVEN BY PHARMACIST PER STANDING ORDER    acetaminophen (TYLENOL) 500 mg tablet Take 1,000 mg by mouth at bedtime    losartan (COZAAR) 50 MG tablet Take 1 tablet (50 mg total) by mouth daily    alendronate (FOSAMAX) 70 MG tablet TAKE 1 TABLET BY MOUTH ONCE EVERY 7 DAYS TAKE WITH A FULL GLASS OF WATER DO NOT EAT OR LIE DOWN FOR 30 MINUTES    aspirin 81 MG tablet Take 1 tablet (81 mg  total) by mouth daily     She is allergic to lipitor [atorvastatin].    Social and Family History     Family History   Problem Relation Age of Onset    Dementia Mother 79    Cancer Mother      lung    High cholesterol Mother     Stroke Mother     Parkinsonism Father     Heart Disease Father 20     passed    High cholesterol Father     Heart attack Father     Cancer Sister      Breast; in recovery    High cholesterol Sister     Hip fracture Sister     Breast cancer Sister 43    Cancer Brother      prostate    High cholesterol Brother     Diabetes Brother     Aneurysm Neg Hx      Social History     Social History    Marital status: Divorced     Spouse name: N/A    Number of children: N/A    Years of education: N/A     Occupational History    Not on file.     Social History Main Topics    Smoking status: Former Smoker     Packs/day: 1.00     Years: 30.00     Types: Cigarettes     Quit date: 06/15/2013    Smokeless tobacco: Never  Used    Alcohol use 5.4 oz/week     9 Glasses of wine per week    Drug use: No    Sexual activity: Not Currently     Social History Narrative    Lives alone currently in townhome     2 brothers and 2 sisters that live here    2 boys ([place], [place])    Grandchildren          Review of Systems     Review of Systems   Constitutional: Positive for malaise/fatigue.   HENT: Negative.    Eyes: Negative.    Respiratory: Negative.  Negative for shortness of breath.    Cardiovascular: Negative.  Negative for chest pain, palpitations, orthopnea, claudication, leg swelling and PND.   Gastrointestinal: Negative.    Genitourinary: Positive for frequency.   Musculoskeletal: Positive for back pain.   Skin: Negative.    Neurological: Positive for dizziness (If she looks up). Negative for loss of consciousness.   Endo/Heme/Allergies: Negative.    Psychiatric/Behavioral: Negative.      Vitals and Physical Exam     [name]'s  height is 1.626 m (5\' 4" ) and weight is 64.4 kg (142 lb). Her blood pressure is 134/86 and her pulse is 74.  Body mass index is 24.37 kg/(m^2).    Physical Exam   Constitutional: She is oriented to person, place, and time. She appears well-developed and well-nourished. No distress.   HENT:   Head: Normocephalic and atraumatic.   Eyes: Pupils are equal, round, and reactive to light.   Neck: Normal range of motion. No JVD present. Carotid bruit is not present. No tracheal deviation present. No thyromegaly present.   Cardiovascular: Normal rate, regular rhythm, normal heart sounds and intact distal pulses.  Exam reveals no friction rub.    No murmur heard.  Pulmonary/Chest: Effort normal and breath sounds normal. No respiratory distress. She has no wheezes. She has no rales. She exhibits no tenderness.   Abdominal:  Soft. She exhibits no distension. There is no tenderness.   Musculoskeletal: She exhibits no edema.   Lymphadenopathy:     She has no cervical adenopathy.   Neurological: She is alert and oriented  to person, place, and time.   Skin: No rash noted.   Psychiatric: She has a normal mood and affect. Her behavior is normal. Judgment and thought content normal.     Laboratory Data     Hematology:   Results in Past 730 Days  Result Component Current Result Previous Result   WBC 4.6 (04/12/2016) Not in Time Range   Hemoglobin 13.3 (04/12/2016) Not in Time Range   Hematocrit 40 (04/12/2016) Not in Time Range   Platelets 289 (04/12/2016) Not in Time Range     Chemistry:   Results in Past 730 Days  Result Component Current Result Previous Result   Sodium 139 (09/27/2016) 139 (05/23/2016)   Potassium 4.1 (09/27/2016) 4.9 (05/23/2016)   Creatinine 0.80 (09/27/2016) 0.86 (05/23/2016)   Glucose 98 (09/27/2016) 94 (05/23/2016)   Calcium 9.8 (09/27/2016) 10.1 (05/23/2016)   Hemoglobin A1C 5.5 (07/20/2015) Not in Time Range   AST 21 (09/27/2016) 35 (05/23/2016)   ALT 16 (09/27/2016) 26 (05/23/2016)     Coagulation Studies:   No results found for requested labs within last 730 days.     Cardiac:   No results found for requested labs within last 730 days.     Lipids:   Results in Past 730 Days  Result Component Current Result Previous Result   Cholesterol 200 (!) (09/27/2016) 184 (05/23/2016)   HDL 70 (09/27/2016) 74 (05/23/2016)   Triglycerides 105 (09/27/2016) 58 (05/23/2016)   LDL Calculated 109 (09/27/2016) 98 (05/23/2016)   Chol/HDL Ratio 2.9 (09/27/2016) 2.5 (05/23/2016)       Cardiac/Imaging Data & Risk Scores              Dobutamine Stress Echo Complete 10/03/2016    Narrative Dobutamine infusion to target heart rate without chest discomfort or   diagnostic ECG changes.  No echo evidence of ischemia after dobutamine infusion to target heart   rate.  Mild-moderate dynamic LVOT obstruction at peak dobutamine infusion.                        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 female with hypertension and hyperlipidemia which is generally under good control.  She has an expanding abdominal aortic aneurysm which is now 5.16 cm.  She is scheduled for endovascular repair in the near future.      Dobutamine stress echocardiogram completed in our office showed normal LV function with no valvular abnormalities and no ischemia.  There were frequent PVCs as expected with dobutamine.  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  This is commonly seen when measured and related to the level of inotropic response from the dobutamine.  Her anatomy is not consistent with significant LVOT obstruction at rest.  It is not clinically important and she is not symptomatic from that.  I would suggest avoiding severe dehydration, anemia or dobutamine infusions in the future.    She will continue her current medications.  I did not change anything.  I explained the stress test.  I feel she is okay to go forward with her endovascular AAA repair.  I will see her back on an as-needed basis.         Roselie Awkward, MD  Electronically signed on 10/23/2016 at 12:59 PM.

## 2016-10-23 NOTE — Patient Instructions (Signed)
[facility] for vascular surgery

## 2016-10-26 ENCOUNTER — Ambulatory Visit: Payer: Medicare (Managed Care) | Attending: Surgery | Admitting: Vascular Surgery

## 2016-10-26 ENCOUNTER — Other Ambulatory Visit: Payer: Self-pay | Admitting: Family Medicine

## 2016-10-26 ENCOUNTER — Ambulatory Visit: Payer: Medicare (Managed Care) | Admitting: Family Medicine

## 2016-10-26 ENCOUNTER — Encounter: Payer: Self-pay | Admitting: Vascular Surgery

## 2016-10-26 VITALS — BP 108/60 | Ht 64.0 in | Wt 142.0 lb

## 2016-10-26 DIAGNOSIS — I714 Abdominal aortic aneurysm, without rupture, unspecified: Secondary | ICD-10-CM

## 2016-10-26 DIAGNOSIS — M810 Age-related osteoporosis without current pathological fracture: Secondary | ICD-10-CM

## 2016-10-26 NOTE — Telephone Encounter (Signed)
LOV 06/11/2016

## 2016-10-29 NOTE — Progress Notes (Signed)
Vascular Surgery Outpatient Clinic Follow-Up Visit    HPI:     [name] is a 76 y.o. female  with 5.1 cm AAA here to discuss surgical options. PMH significant for HLD, chronic back pain, Sciatica, herniated discs, GERD. Smoking history: former smoker, quit in 2015. No FHx significant for aneurysmal disease. The patient notes a history of chronic back pain and left shoulder pain which causes a great deal of chronic pain problems.  Accompanied by her brother.    Vascular risk factors:   Age: 76 y.o.  Female gender: no  Diabetes mellitus: no  Obesity: no, Body mass index is 24.37 kg/(m^2).  Hypertension: yes  Hyperlipidemia: yes  Tobacco abuse: no  Family history: no  VQI Info:  Occupation: Retired  Ambulatory status: Ambulates  Current living status: Home Comorbidities:  Coronary artery disease: no  Renal disease: no  Lung disease: no,   Arrhythmia: no  Prior CVA: no  CHF: no  Prior PCI: no  Prior open heart surgery: no  Liver disease: no  Coagulopathy: no     REVIEW OF SYSTEMS  ROS    MEDICAL HISTORY  Past Medical History:   Diagnosis Date    AAA (abdominal aortic aneurysm)     Elevated LFTs     Unclear if this was related to statin, gallstones, alcohol consumption     Essential hypertension 06/11/2016    GERD (gastroesophageal reflux disease)     HLD (hyperlipidemia)     Hx of colonic polyps     Colonscopy 08/11/10; repeat 2017 per GI     Lumbar disc herniation 2006    L5-S1    Osteoporosis     Spondylosis     Urinary retention        SURGICAL HISTORY  Past Surgical History:   Procedure Laterality Date    COLONOSCOPY  2011    HYSTERECTOMY      left ovaries in place     TUBAL LIGATION         FAMILY HISTORY  Family History   Problem Relation Age of Onset    Dementia Mother 78    Cancer Mother      lung    High cholesterol Mother     Stroke Mother     Parkinsonism Father     Heart Disease Father 105     passed    High cholesterol Father     Heart attack Father     Cancer Sister      Breast; in  recovery    High cholesterol Sister     Hip fracture Sister     Breast cancer Sister 61    Cancer Brother      prostate    High cholesterol Brother     Diabetes Brother     Aneurysm Neg Hx         SOCIAL HISTORY   reports that she quit smoking about 3 years ago. Her smoking use included Cigarettes. She has a 30.00 pack-year smoking history. She has never used smokeless tobacco. She reports that she drinks about 5.4 oz of alcohol per week  She reports that she does not use illicit drugs.     ALLERGIES  Lipitor [atorvastatin]     MEDICATIONS  Current Outpatient Prescriptions   Medication Sig    Calcium Carbonate Antacid 600 MG chewable tablet Take 600 mg by mouth    simvastatin (ZOCOR) 40 MG tablet Take 1 tablet (40 mg total) by mouth daily (  with dinner)    FLUZONE HIGH-DOSE 0.5 ML SUSY injection TO BE GIVEN BY PHARMACIST PER STANDING ORDER    acetaminophen (TYLENOL) 500 mg tablet Take 1,000 mg by mouth at bedtime    losartan (COZAAR) 50 MG tablet Take 1 tablet (50 mg total) by mouth daily    aspirin 81 MG tablet Take 1 tablet (81 mg total) by mouth daily    alendronate (FOSAMAX) 70 MG tablet TAKE 1 TABLET BY MOUTH ONCE EVERY 7 DAYS TAKE WITH A FULL GLASS OF WATER DO NOT EAT OR LIE DOWN FOR 30 MINUTES     No current facility-administered medications for this visit.         Objective:      Vitals:    10/26/16 1021   BP: 108/60   Weight: 64.4 kg (142 lb)   Height: 1.626 m (5\' 4" )         Imaging:  DBA echo  Dobutamine infusion to target heart rate without chest discomfort or diagnostic ECG changes.  No echo evidence of ischemia after dobutamine infusion to target heart rate.  Mild-moderate dynamic LVOT obstruction at peak dobutamine infusion.    Carotid:  Right internal carotid artery (ICA)  Mild right internal carotid artery stenosis (16-49%).    Right vertebral artery is antegrade.     Left internal carotid artery (ICA)  Mild left internal carotid artery stenosis (16-49%).   Left vertebral artery is  antegrade.   Vessels are tortuous and dive deep.    CTA  There is an infrarenal abdominal aortic aneurysm that begins approximately 2.5 cm below the origin of the right renal artery and extends approximately 8.8 cm in cranial caudad length. The greatest transaxial dimensions of the aneurysm are 5.0 cm    anteroposterior by 5.5 cm transverse.      Minimal stenosis at the left common iliac artery origin. At least mild stenosis at the right renal artery origin. 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      Evidence of prior granulomatous disease with a calcified right lower lobe pulmonary nodule and numerous calcified hepatic and splenic granulomas.      2 mm nonobstructing right renal stone. Subcentimeter low-density renal lesions are too small to definitively characterize, but likely represent tiny cysts or hemangiomas.       Physical Exam:      General appearance: healthy, alert, active, no distress and cooperative  HEENT: PERRLA  CV: regular rate and rhythm, S1, S2 normal, no murmur, click, rub or gallop  Lungs:clear  Abd: soft, non-tender. Bowel sounds normal. No masses,  no organomegaly   Extremities: palpable femoral, DP/PT pulses  Neuro: normal without focal findings, mental status, speech normal, alert and oriented x3, PERLA and reflexes normal and symmetric    Assessment:   [name] is a 76 y.o. female with 5.1 cm infrarenal AAA which appears amenable to endoluminal repair.      Plan:   1. Discussed risks and benefits of EVAR to the patient who understands and agrees to proceed.  2. OR 11/14.

## 2016-11-02 ENCOUNTER — Other Ambulatory Visit: Payer: Self-pay

## 2016-11-02 DIAGNOSIS — I714 Abdominal aortic aneurysm, without rupture, unspecified: Secondary | ICD-10-CM

## 2016-11-05 ENCOUNTER — Encounter: Payer: Self-pay | Admitting: Orthopedic Surgery

## 2016-11-05 ENCOUNTER — Ambulatory Visit
Admission: RE | Admit: 2016-11-05 | Discharge: 2016-11-05 | Disposition: A | Discharge status: Home / Self Care | Payer: Medicare (Managed Care) | Source: Ambulatory Visit | Attending: Radiology | Admitting: Radiology

## 2016-11-05 ENCOUNTER — Other Ambulatory Visit: Payer: Self-pay | Admitting: Cardiology

## 2016-11-05 ENCOUNTER — Ambulatory Visit
Admission: RE | Admit: 2016-11-05 | Discharge: 2016-11-05 | Disposition: A | Discharge status: Home / Self Care | Payer: Medicare (Managed Care) | Source: Ambulatory Visit | Attending: Vascular Surgery | Admitting: Vascular Surgery

## 2016-11-05 DIAGNOSIS — Z01818 Encounter for other preprocedural examination: Secondary | ICD-10-CM

## 2016-11-05 DIAGNOSIS — I499 Cardiac arrhythmia, unspecified: Secondary | ICD-10-CM

## 2016-11-05 DIAGNOSIS — I714 Abdominal aortic aneurysm, without rupture: Secondary | ICD-10-CM | POA: Insufficient documentation

## 2016-11-05 LAB — HEPATIC FUNCTION PANEL
ALT: 22 U/L (ref 0–35)
AST: 29 U/L (ref 0–35)
Albumin: 5 g/dL (ref 3.5–5.2)
Alk Phos: 68 U/L (ref 35–105)
Bilirubin,Direct: <0.2 mg/dL (ref 0.0–0.3)
Bilirubin,Total: 0.4 mg/dL (ref 0.0–1.2)
Total Protein: 7.5 g/dL (ref 6.3–7.7)

## 2016-11-05 LAB — CBC AND DIFFERENTIAL
Baso # K/uL: 0.1 10*3/uL (ref 0.0–0.1)
Basophil %: 1.1 %
Eos # K/uL: 0.1 10*3/uL (ref 0.0–0.4)
Eosinophil %: 1.2 %
Hematocrit: 44 % (ref 34–45)
Hemoglobin: 14.2 g/dL (ref 11.2–15.7)
IMM Granulocytes #: 0 10*3/uL (ref 0.0–0.1)
IMM Granulocytes: 0.3 %
Lymph # K/uL: 1.4 10*3/uL (ref 1.2–3.7)
Lymphocyte %: 20.8 %
MCH: 31 pg/cell (ref 26–32)
MCHC: 32 g/dL (ref 32–36)
MCV: 95 fL (ref 79–95)
Mono # K/uL: 0.5 10*3/uL (ref 0.2–0.9)
Monocyte %: 8.2 %
Neut # K/uL: 4.4 10*3/uL (ref 1.6–6.1)
Nucl RBC # K/uL: 0 10*3/uL (ref 0.0–0.0)
Nucl RBC %: 0 /100 WBC (ref 0.0–0.2)
Platelets: 295 10*3/uL (ref 160–370)
RBC: 4.6 MIL/uL (ref 3.9–5.2)
RDW: 13 % (ref 11.7–14.4)
Seg Neut %: 68.4 %
WBC: 6.5 10*3/uL (ref 4.0–10.0)

## 2016-11-05 LAB — BASIC METABOLIC PANEL
Anion Gap: 13 (ref 7–16)
CO2: 28 mmol/L (ref 20–28)
Calcium: 10.3 mg/dL — ABNORMAL HIGH (ref 8.6–10.2)
Chloride: 100 mmol/L (ref 96–108)
Creatinine: 0.77 mg/dL (ref 0.51–0.95)
GFR,Black: 86 *
GFR,Caucasian: 75 *
Glucose: 100 mg/dL — ABNORMAL HIGH (ref 60–99)
Lab: 12 mg/dL (ref 6–20)
Potassium: 4.4 mmol/L (ref 3.3–5.1)
Sodium: 141 mmol/L (ref 133–145)

## 2016-11-05 LAB — URINE MICROSCOPIC (IQ200)
RBC,UA: NONE SEEN /hpf (ref 0–2)
WBC,UA: <1 /hpf (ref 0–5)

## 2016-11-05 LAB — MRSA (ORSA) AMPLIFICATION: MRSA (ORSA) Amplification: 0

## 2016-11-05 LAB — URINALYSIS REFLEX TO CULTURE
Ketones, UA: NEGATIVE
Leuk Esterase,UA: NEGATIVE
Nitrite,UA: NEGATIVE
Protein,UA: NEGATIVE mg/dL
Specific Gravity,UA: 1.006 (ref 1.002–1.030)
pH,UA: 7 (ref 5.0–8.0)

## 2016-11-05 LAB — LDL CHOLESTEROL, DIRECT: LDL Direct: 133 mg/dL — AB

## 2016-11-05 LAB — POCT HGB: Hgb, POC: 13.4 g/dL (ref 11.2–15.7)

## 2016-11-05 LAB — PROTIME-INR
INR: 1 (ref 0.9–1.1)
Protime: 11.7 s (ref 10.0–12.9)

## 2016-11-05 LAB — TYPE AND SCREEN
ABO RH Blood Type: O POS
Antibody Screen: NEGATIVE

## 2016-11-05 NOTE — Discharge Instructions (Signed)
[facility] Preoperative Instructions            Patient Name: [name]  Surgery Date:  Wednesday, November 14        When to Arrive for Surgery         On the day prior to your surgery, Tuesday, November 13, you will find out your arrival time. [place] - Please call [phone] between 2:30 and 7:00 p.m. .        Note: Patients scheduled for a procedure on Monday will be assigned an arrival time on the Friday before. Please note surgery start time is approximate. You may want to bring something to help pass the time.        PLEASE ARRIVE ON TIME.        Directions to [facility]: On the day of your procedure, park in the parking garage and take the elevator/stairs to Level One (1), then follow the walkway to the [place].  Walk past the Information Desk in the lobby, towards the Lab & Outpatient Services.  Follow the GREEN (R) ceiling tags to the GREEN elevators. (Valet parking is available outside the front entrance of the hospital between 6:00 AM and 5:00 PM and assistance is available at the information desk, if needed).   Continue to the [facility]): Take the GREEN (G) elevators to the Basement (Level B - Two floors down) to the [facility] and check in with the receptionist at the desk.        Eating Guidelines        Follow the instructions below unless otherwise instructed by your physician.        No solid food AFTER MIDNIGHT on the day of your surgery. No candy, gum, mints or chewing tobacco.        You can have clear liquids up to 4 hours before your surgery. This includes water, apple juice,  clear carbonated beverages,  black coffee,  clear tea. No milk, cream, or non-dairy creamers.         Failure to follow these instructions, could lead to a delay or cancellation of your procedure.        Medication Guidelines        On the morning of surgery, take only the medications indicated on the  Preoperative Medication List below.        Medications should only be taken with no more than one ounce of water.        Hold any herbal supplements 5-7 days prior to surgery.  You may take Tylenol (Acetaminophen) if needed.  Continue taking Aspirin as directed.  You may continue taking any non-steroidal anti-inflammatory agents such as Ibuprofen (Advil, Motrin) or Naproxen (Aleve).  Do not take powder supplements or Metamucil on the day of the procedure.        Additional Information        What to bring or wear:        Bring Photo ID and insurance information.        Eye glasses and/or hearing aids:  These may be removed prior to surgery so be prepared to leave them with a trusted family member.        Do NOT wear contact lenses.        Wear comfortable, loose fit clothing.          You must arrange  a ride home before coming to surgery.        What NOT to bring or wear:        Before coming to the hospital, remove all makeup (including mascara), jewelry (including wedding band and watch), hair accessories and nail polish from toes and fingers.  Do not bring any valuables (money, wallet, purse, jewelry, or contact lenses.)        Information for After Surgery:        Your family will be directed to a waiting area when you are taken to surgery.        We ask that only one or two family members accompany you on the day of your procedure.        No children under the age of 58 are allowed as visitors in the [place].        Additional visitor restrictions are possible during influenza season.        Your family will be notified when your surgery is completed and you have arrived on the patient care unit.        Expected Length of Stay:        SDA Admission:  You are being admitted to the hospital after surgery. Please leave any luggage in your car until after your procedure.  Your family can bring this into the hospital once you are in your room.        Health standards require that a responsible adult  must accompany any patient who has received anesthetics or sedation and is going home the same day. You must arrange a ride home before coming to surgery.        Questions:    Please call the [facility] at [phone] between 8:00 a.m. and 4:00 p.m. Monday through Friday. You were seen today by  Adron Bene NP.                Surgical Site Infections FAQs        What is a Surgical Site infection (SSI)?  A surgical site infection is an infection that occurs after surgery In the part of the body where the surgery took place. Most patients who have surgery do not develop an infection. However, Infections develop in about 1 to 3 out of every 100 patients who have surgery.         Some of the common symptoms of a surgical site infection are:    Redness and pain around the area where you had surgery    Drainage of cloudy fluid from your surgical wound    Fever         Can SSIs be treated?   Yes. Most surgical site infections can be treated with antibiotics. The antibiotic given to you depends on the bacteria (germs) causing the Infection. Sometimes patients with SSIs also need another surgery to treat the infection.         What are some of the things that hospitals are doing to prevent SSls?   To prevent SSIs, doctors, nurses, and other healthcare providers:    Clean their hands and arms up to their elbows with an antiseptic agent just before the surgery.    Clean their hands with soap and water or an alcohol-based hand rub before and after caring for each patient.    May remove some of your hair Immediately before your surgery using electric clippers If the hair Is in the same area where  the procedure will occur. They should not shave you with a razor.    Wear special hair covers, masks, gowns, and gloves during surgery to keep the surgery area clean.    Give you antibiotics before your surgery starts. in most cases, you should get antibiotics within 60 mInutes before the surgery  starts and the antibiotics should be stopped within 24 hours after surgery.    Clean the skin at the site of your surgery with a special soap that kills germs.         What can I do to help prevent SSIs?   Before your surgery:    Tell your doctor about other medical problems you may have. Health problems such as allergies, diabetes, and obesity could affect your surgery and your treatment.    Quit smoking. Patients who smoke get more Infections. Talk to your doctor about how you can quit before your surgery.    Do not shave near where you will have surgery. Shaving with a razor can Irritate your skin and make it easier to develop an infection.         At the time of your surgery:    Speak up if someone tries to shave you with a razor before surgery. Ask why you need to be shaved and talk with your surgeon if you have any concerns.    Ask if you will get antibiotics before surgery.         After your surgery:    Make sure that your healthcare providers clean their hands before examining you, either with soap and water or an alcohol-based hand rub.   If you do not see your healthcare providers wash their hands,   please ask them to do so.     Family and friends who visit you should not touch the surgical wound or dressings.    Family and friends should clean their hands with soap and water or an alcohol-based hand rub before and after visiting you. If you do not see them clean their hands, ask them to clean their hands.   What do I need to do when I go home from the hospital?    Before you go home, your doctor or nurse should explain everyt hing you need to know about taking care of your wound. Make sure you understand how to care for your wound before you leave the hospital.    Always clean your hands before and after caring for your wound.    Before you go home, make sure you know who to contact If you have questions or problems after you get home.    If you have any symptoms of an Infection, such as  redness and pain at the surgery site, drainage, or fever, call your doctor immediately.   if you have additional questions, Please ask your doctor or nurse.        Name:__________________ _  Surgery date: ________________     [place]  Preoperative Vascular Surgery Patient Instructions  PREOPERATIVE SCRUBS     Welcome to [place] are anticipating having surgery with [place], and there are a few important things that you can do to help [place] care for you. Infection is a potential risk with any surgery and we have identified some ways that you can help [place] lessen your risk:    YOUR SKIN - Germs normally live on your skin, and pose no risk to you, however when you  have surgery, an incision is made into your skin which interrupts the normal protection that skin provides. In order to decrease the germs on your skin, we have you take two showers or baths, one the night before your surgery and one the morning of the surgery. Shower or bathe first using your regular shampoo and soap/body wash. Rinse your body prior to use of the sponge with special germ killing soap (Chlorhexidine soap). If you cannot take a shower, then you should wash the area of your surgery with the special soap or wipe that we will give you. Gently wash the area for 30 seconds each from:                                                 Shoulder to Mid Thighs    Use Caution: Do not get the chlorhexadine in your eyes.   It has been reported that the chlorhexadine can make the shower slippery.    Rinse after washing the areas and dry with a freshly laundered towel and put on freshly laundered clothing.  Do not apply any lotions, moisturizers, deodorant, perfume, cologne or makeup after using the sponge.    NO SHAVING! Shaving your skin with a razor blade can actually increase your risk of infection. Do not shave any area of your body 2 days prior to the surgery except for your face. We may prepare areas of your skin by removing  hair with a special clipper.    QUESTIONS? PLEASE CALL [phone] and ask to speak with a Camera operator.

## 2016-11-05 NOTE — Anesthesia Preprocedure Evaluation (Addendum)
Anesthesia Pre-operative History and Physical for [name]  History and Physical Performed at [facility] ([place])  Highlighted Issues for this Procedure:  76 y.o. female with AAA (abdominal aortic aneurysm) without rupture presenting for AAA ENDOVASCULAR REPAIR, by Dr. Melony Overly, Bjorn Loser, MD MPH scheduled for 150 minutes.     She has AAA (abdominal aortic aneurysm 5.0 cm -- infrarenal abdominal aortic aneurysm that begins approximately 2.5 cm below the origin of the right renal artery and extends approximately 8.8 cm in cranial caudad length. The greatest transaxial dimensions of the aneurysm are 5.0 cm )    GERD  HTN (losartan)  HLD (simvastatin)   Lumbar L5-S1 disc herniation  Osteoporosis   Spondylosis  urinary retention            Stress Test/Echocardiography:  Dobutamine Stress echo, complete: 10/03/2016  Dobutamine infusion to target heart rate without chest discomfort or diagnostic ECG changes.  No echo evidence of ischemia after dobutamine infusion to target heart rate.  Mild-moderate dynamic LVOT obstruction at peak dobutamine infusion.  Electrophysiology/AICD/Pacer:  EKG: 11/05/2016  SR 75 BPM  Left axis deviation.  C/W EKG obtained on 06/14/2016.  No rhythm or ischemic changes  Cardiac /Vascular Catheterization:   Bilateral carotid US: 09/21/2016  Right internal carotid artery (ICA)  Mild right internal carotid artery stenosis (16-49%).    Right vertebral artery is antegrade.     Left internal carotid artery (ICA)  Mild left internal carotid artery stenosis (16-49%).   Left vertebral artery is antegrade.   Vessels are tortuous and dive deep.    [facility] Summary:  [name] presents preoperatively for anesthesia evaluation prior to   Procedure:   AAA ENDOVASCULAR REPAIR (EVAR) (N/A Abdomen) with Dr. Melony Overly On 11/14/2016.     Past Medical History:   AAA: 5.0 cm:  CTA  There is an infrarenal abdominal aortic aneurysm that begins approximately 2.5 cm below the origin of the right renal artery and extends  approximately 8.8 cm in cranial caudad length. The greatest transaxial dimensions of the aneurysm are 5.0 cm   Elevated LFTs: Unclear if this was related to statin, gallstones, alcohol consumption    hypertension: Losartan  Hyperlipidemia: Simvastatin  GERD: Tums    Lumbar disc herniation-L5-S1   Osteoporosis: Alendronate   Spondylosis      The patient has no dyspnea, denies chest pain, is moderately active, climbs stairs and can lie flat.      By Foy Guadalajara, NP at 4:17 AM on 11/05/2016    Anesthesia Evaluation Information Source: patient, records, family     ANESTHESIA HISTORY  Pertinent(-):  No History of anesthetic complications or Family hx of anesthetic complications    GENERAL  Pertinent (-):  No obesity or infection    HEENT    + Visual Impairment          corrective lens for ADL    + Hearing Loss            Right Ear: HOH            Left Ear: HOH    + Sinus Issues            PND and allergic rhinitis  Pertinent (-):  No glaucoma, hoarseness, TMJD, nosebleeds or neck pain PULMONARY    + Smoker          tobacco former  Pertinent(-):  No asthma, shortness of breath, recent URI, cough/congestion, snoring, sleep apnea or COPD    CARDIOVASCULAR  Good(4+METs) Exercise Tolerance    + Hypertension          well controlled    + Cardiac Testing          nuclear stress test    + Anticoagulants          ASA  Pertinent(-):  No coronary intervention, dysrhythmias, orthopnea, vascular Issues or hx of DVT    Comment: Takes walks outside 3/4 of a mile taking about 20 minutes without SOB or CP.    GI/HEPATIC/RENAL  Last PO Intake: >8hr before procedure    + GERD          well controlled    + Alcohol use          >2 drinks/day    + Bowel Issues ( Occasional constipat;ion)  Pertinent(-):  No nausea, vomiting, liver  issues, renal issues or urinary issues NEURO/PSYCH    + Chronic pain          lower back  Pertinent(-):  No headaches, syncope, neuropsychiatric issues, seizures, cerebrovascular event or gait/mobility  issuesDizziness:  orthostatic type dizziness.    ENDO/OTHER  Pertinent(-):  No diabetes mellitus, thyroid disease, adrenal disease, hormone use, steroid use, chemo Hx, menstruating (hysterectomy)    HEMATOLOGIC    + Bruises/bleeds easily    + Anticoagulants/Antiplatelets          ASA    + Blood dyscrasia          hyperlipidemia    + Arthritis          lumbar  Pertinent(-):  No blood transfusion or autoimmune disease       Physical Exam    Airway            Mouth opening: normal            Mallampati: II            TM distance (fb): >3 FB            Neck ROM: full  Dental    Upper: dentures, edentulous Lower: partial, all teeth missing except marked       Comment: Denies any broken or loose teeth   Cardiovascular  Normal Exam           Rhythm: regular           Rate: normal  No friction rub, peripheral edema or murmur      Neurologic    Normal Exam    General Survey    Normal Exam   Pulmonary   Normal Exam    breath sounds clear to auscultation    No cough, rhonchi, wheezes    Mental Status   Normal Exam    oriented to person, place and time     Patient Education from [facility] Provider:  Patient Education:  The following items were discussed with [name] to her satisfaction and comprehension:  The facility's NPO guidelines were discussed  To call the surgeon if she becomes ill prior to surgery  All questions were answered  Medications DOS with sip of water: As per AVS  Hold medications AM day of surgery: As per AVS  No barriers to learning identified.  IV insertion was reviewed with her.  The importance of coughing and deep breathing was emphasized.  LORMA HEATER was instructed she may continue to use ASA/NSAIDS as directed  Chlohexidine scrub instructions reviewed.    ________________________________________________________________________  PLAN  ASA Score  4  Anesthetic Plan general  Induction (cardioprotective) General Anesthesia/Sedation Maintenance Plan (inhaled agents, neuromuscular blockade and IV bolus);   Airway Manipulation (direct laryngoscopy); Airway (cuffed ETT); Line ( use current access, additional large bore IV and A- line); Monitoring (standard ASA and preinduction arterial); Positioning (supine); PONV Plan (ondansetron and haloperidol); Pain (per surgical team); PostOp (PACU)    Informed Consent     Risks:          Risks discussed were commensurate with the plan listed above with the following specific points: N/V, aspiration, sore throat and infection , damage to:(eyes, teeth), allergic Rx, unexpected serious injury, awareness    Anesthetic Consent:         Anesthetic plan (and risks as noted above) were discussed with patient and adult children    Blood products Consent:        Use of blood products discussed with: adult children and patient and they consented    Plan also discussed with team members including:       resident, surgeon and attending    Attending Attestation:  As the primary attending anesthesiologist, I attest that the patient or proxy understands and accepts the risks and benefits of the anesthesia plan. I also attest that I have personally performed a pre-anesthetic examination and evaluation, and prescribed the anesthetic plan for this particular location within 48 hours prior to the anesthetic as documented. Alphonzo Grieve, MD 1:44 PM

## 2016-11-06 ENCOUNTER — Other Ambulatory Visit: Payer: Self-pay | Admitting: Family Medicine

## 2016-11-06 DIAGNOSIS — R030 Elevated blood-pressure reading, without diagnosis of hypertension: Secondary | ICD-10-CM

## 2016-11-06 LAB — EKG 12-LEAD
P: 30 deg
PR: 160 ms
QRS: -30 deg
QRSD: 94 ms
QT: 356 ms
QTc: 398 ms
Rate: 75 {beats}/min
T: -45 deg

## 2016-11-06 LAB — HEMOGLOBIN A1C: Hemoglobin A1C: 5.7 % (ref 4.0–6.0)

## 2016-11-14 ENCOUNTER — Encounter
Admission: RE | Disposition: A | Discharge status: Home Health | Payer: Self-pay | Source: Ambulatory Visit | Attending: Vascular Surgery

## 2016-11-14 ENCOUNTER — Ambulatory Visit
Admission: RE | Admit: 2016-11-14 | Discharge: 2016-11-14 | Disposition: A | Discharge status: Home / Self Care | Payer: Medicare (Managed Care) | Source: Ambulatory Visit | Attending: Vascular Surgery | Admitting: Vascular Surgery

## 2016-11-14 ENCOUNTER — Inpatient Hospital Stay: Payer: Medicare (Managed Care) | Admitting: Orthopedic Surgery

## 2016-11-14 ENCOUNTER — Inpatient Hospital Stay
Admission: RE | Admit: 2016-11-14 | Discharge: 2016-11-16 | DRG: 269 | Disposition: A | Discharge status: Home Health | Payer: Medicare (Managed Care) | Source: Ambulatory Visit | Attending: Vascular Surgery | Admitting: Vascular Surgery

## 2016-11-14 ENCOUNTER — Inpatient Hospital Stay: Payer: Medicare (Managed Care) | Admitting: Neurosurgery

## 2016-11-14 DIAGNOSIS — I714 Abdominal aortic aneurysm, without rupture, unspecified: Secondary | ICD-10-CM

## 2016-11-14 DIAGNOSIS — K219 Gastro-esophageal reflux disease without esophagitis: Secondary | ICD-10-CM | POA: Diagnosis present

## 2016-11-14 DIAGNOSIS — E785 Hyperlipidemia, unspecified: Secondary | ICD-10-CM | POA: Diagnosis present

## 2016-11-14 DIAGNOSIS — I10 Essential (primary) hypertension: Secondary | ICD-10-CM | POA: Diagnosis present

## 2016-11-14 LAB — RED BLOOD CELLS
Coded Blood type: 5100
Coded Blood type: 5100
Component blood type: O POS
Component blood type: O POS

## 2016-11-14 LAB — BASIC METABOLIC PANEL
Anion Gap: 17 — ABNORMAL HIGH (ref 7–16)
CO2: 20 mmol/L (ref 20–28)
Calcium: 7.4 mg/dL — ABNORMAL LOW (ref 8.6–10.2)
Chloride: 106 mmol/L (ref 96–108)
Creatinine: 0.84 mg/dL (ref 0.51–0.95)
GFR,Black: 77 *
GFR,Caucasian: 67 *
Glucose: 146 mg/dL — ABNORMAL HIGH (ref 60–99)
Lab: 12 mg/dL (ref 6–20)
Potassium: 4.2 mmol/L (ref 3.3–5.1)
Sodium: 143 mmol/L (ref 133–145)

## 2016-11-14 LAB — BLOOD GASES AND WHOLE BLD ANALYTES ART
Anion Gap,WB: 11 (ref 7–16)
Anion Gap,WB: 12 (ref 7–16)
Base Excess, Arterial: 0 mmol/L (ref <=2)
Base Excess, Arterial: -3 mmol/L — ABNORMAL LOW (ref <=2)
CO2,ART (Calc): 25 mmol/L (ref 21–28)
CO2,ART (Calc): 22 mmol/L (ref 21–28)
CO: 0.3 %
CO: 0.3 %
Chloride,WB: 108 mmol/L (ref 98–108)
Chloride,WB: 106 mmol/L (ref 98–108)
FO2 Hb, Arterial: 98 % — ABNORMAL HIGH (ref 90–95)
FO2 Hb, Arterial: 98 % — ABNORMAL HIGH (ref 90–95)
Glucose,WB: 99 mg/dL (ref 60–99)
Glucose,WB: 115 mg/dL — ABNORMAL HIGH (ref 60–99)
HCO3, Arterial: 24 mmol/L — ABNORMAL HIGH (ref 19–23)
HCO3, Arterial: 21 mmol/L (ref 19–23)
HCT (Calc): 36 % (ref 34–45)
HCT (Calc): 34 % (ref 34–45)
Hemoglobin: 12.3 g/dL (ref 11.2–15.7)
Hemoglobin: 11.4 g/dL (ref 11.2–15.7)
ICA @7.4,WB: 4.6 mg/dL — ABNORMAL LOW (ref 4.8–5.2)
ICA @7.4,WB: 4.5 mg/dL — ABNORMAL LOW (ref 4.8–5.2)
ICA Uncorr,WB: 4.5 mg/dL
ICA Uncorr,WB: 4.5 mg/dL
Lactate ART,WB: 1.1 mmol/L — ABNORMAL HIGH (ref 0.3–0.8)
Lactate ART,WB: 0.7 mmol/L (ref 0.3–0.8)
Methemoglobin: 0.1 % (ref 0.0–1.0)
Methemoglobin: 0.3 % (ref 0.0–1.0)
NA, WB: 139 mmol/L (ref 135–145)
NA, WB: 135 mmol/L (ref 135–145)
Potassium,WB: 4.4 mmol/L (ref 3.4–4.7)
Potassium,WB: 3.8 mmol/L (ref 3.4–4.7)
pCO2, Arterial: 34 mm Hg (ref 33–43)
pCO2, Arterial: 36 mm Hg (ref 33–43)
pH: 7.46 — ABNORMAL HIGH (ref 7.36–7.44)
pH: 7.39 (ref 7.36–7.44)
pO2,Arterial: 318 mm Hg — ABNORMAL HIGH (ref 80–100)
pO2,Arterial: 264 mm Hg — ABNORMAL HIGH (ref 80–100)

## 2016-11-14 LAB — BLOOD BANK TRANSFUSION

## 2016-11-14 LAB — BB PROTOCOL REVIEW

## 2016-11-14 LAB — ACT LR, POCT
ACT LR, POCT: 149 s (ref 89–169)
ACT LR, POCT: 286 s — ABNORMAL HIGH (ref 89–169)
ACT LR, POCT: 309 s — ABNORMAL HIGH (ref 89–169)
ACT LR, POCT: 333 s — ABNORMAL HIGH (ref 89–169)
ACT LR, POCT: 225 s — ABNORMAL HIGH (ref 89–169)
ACT LR, POCT: 260 s — ABNORMAL HIGH (ref 89–169)
ACT LR, POCT: 272 s — ABNORMAL HIGH (ref 89–169)
ACT LR, POCT: 288 s — ABNORMAL HIGH (ref 89–169)

## 2016-11-14 LAB — PHOSPHORUS: Phosphorus: 3.6 mg/dL (ref 2.7–4.5)

## 2016-11-14 LAB — MAGNESIUM: Magnesium: 1.8 mEq/L (ref 1.3–2.1)

## 2016-11-14 LAB — POCT GLUCOSE: Glucose POCT: 99 mg/dL (ref 60–99)

## 2016-11-14 SURGERY — REPAIR, AAA, ENDOVASCULAR, USING GRAFT
Anesthesia: General | Site: Abdomen | Wound class: Clean

## 2016-11-14 MED ORDER — LACTATED RINGERS IV SOLN *I*
INTRAVENOUS | Status: DC | PRN
Start: 2016-11-14 — End: 2016-11-14

## 2016-11-14 MED ORDER — CALCIUM CARBONATE-VITAMIN D 500-200 MG-UNIT PO TABS *WRAPPED*
1.0000 | ORAL_TABLET | Freq: Two times a day (BID) | ORAL | Status: DC
Start: 2016-11-14 — End: 2016-11-16
  Administered 2016-11-14 – 2016-11-16 (×4): 1 via ORAL
  Filled 2016-11-14 (×5): qty 1

## 2016-11-14 MED ORDER — FENTANYL CITRATE 50 MCG/ML IJ SOLN *WRAPPED*
INTRAMUSCULAR | Status: DC | PRN
Start: 2016-11-14 — End: 2016-11-14
  Administered 2016-11-14: 100 ug via INTRAVENOUS

## 2016-11-14 MED ORDER — LIDOCAINE HCL (PF) 1 % IJ SOLN *I*
INTRAMUSCULAR | Status: AC
Start: 2016-11-14 — End: 2016-11-14
  Filled 2016-11-14: qty 30

## 2016-11-14 MED ORDER — PROPOFOL 10 MG/ML IV EMUL (INTERMITTENT DOSING) WRAPPED *I*
INTRAVENOUS | Status: DC | PRN
Start: 2016-11-14 — End: 2016-11-14
  Administered 2016-11-14: 100 mg via INTRAVENOUS
  Administered 2016-11-14 (×2): 30 mg via INTRAVENOUS
  Administered 2016-11-14: 50 mg via INTRAVENOUS

## 2016-11-14 MED ORDER — CEFAZOLIN 2000 MG IN STERILE WATER 20ML SYRINGE *I*
2000.0000 mg | PREFILLED_SYRINGE | Freq: Once | INTRAVENOUS | Status: AC
Start: 2016-11-14 — End: 2016-11-14
  Administered 2016-11-14 (×2): 2000 mg via INTRAVENOUS

## 2016-11-14 MED ORDER — IOHEXOL 240 MG/ML (OMNIPAQUE) IV SOLN *I*
INTRAMUSCULAR | Status: DC | PRN
Start: 2016-11-14 — End: 2016-11-14
  Administered 2016-11-14: 8 mL via INTRAVENOUS
  Administered 2016-11-14: 55 mL via INTRAVENOUS

## 2016-11-14 MED ORDER — SUGAMMADEX SODIUM 100 MG/1ML IV SOLN *WRAPPED*
INTRAVENOUS | Status: AC
Start: 2016-11-14 — End: 2016-11-14
  Filled 2016-11-14: qty 2

## 2016-11-14 MED ORDER — ACETAMINOPHEN 500 MG PO TABS *I*
1000.0000 mg | ORAL_TABLET | Freq: Three times a day (TID) | ORAL | Status: DC
Start: 2016-11-14 — End: 2016-11-16
  Administered 2016-11-14 – 2016-11-16 (×5): 1000 mg via ORAL
  Filled 2016-11-14 (×5): qty 2

## 2016-11-14 MED ORDER — DEXAMETHASONE SODIUM PHOSPHATE 4 MG/ML INJ SOLN *WRAPPED*
INTRAMUSCULAR | Status: DC | PRN
Start: 2016-11-14 — End: 2016-11-14
  Administered 2016-11-14: 4 mg via INTRAVENOUS

## 2016-11-14 MED ORDER — PROTAMINE SULFATE 10 MG/ML IV SOLN *I*
INTRAVENOUS | Status: DC | PRN
Start: 2016-11-14 — End: 2016-11-14
  Administered 2016-11-14: 10 mg via INTRAVENOUS

## 2016-11-14 MED ORDER — HEPARIN SODIUM (PORCINE) 1000 UNIT/ML IJ SOLN *WRAPPED*
Status: AC
Start: 2016-11-14 — End: 2016-11-14
  Filled 2016-11-14: qty 10

## 2016-11-14 MED ORDER — LIDOCAINE HCL (PF) 1 % IJ SOLN *I*
0.1000 mL | INTRAMUSCULAR | Status: DC | PRN
Start: 2016-11-14 — End: 2016-11-14
  Administered 2016-11-14: 0.1 mL via SUBCUTANEOUS
  Filled 2016-11-14: qty 2

## 2016-11-14 MED ORDER — SODIUM CHLORIDE 0.9 % IV SOLN WRAPPED *I*
20.0000 mL/h | Status: DC
Start: 2016-11-14 — End: 2016-11-14

## 2016-11-14 MED ORDER — LOSARTAN POTASSIUM 50 MG PO TABS *I*
50.0000 mg | ORAL_TABLET | Freq: Every day | ORAL | Status: DC
Start: 2016-11-15 — End: 2016-11-16
  Administered 2016-11-15 – 2016-11-16 (×2): 50 mg via ORAL
  Filled 2016-11-14 (×2): qty 1

## 2016-11-14 MED ORDER — ASPIRIN 81 MG PO CHEW *I*
81.0000 mg | CHEWABLE_TABLET | Freq: Every day | ORAL | Status: DC
Start: 2016-11-14 — End: 2016-11-16
  Administered 2016-11-15 – 2016-11-16 (×2): 81 mg via ORAL
  Filled 2016-11-14 (×3): qty 1

## 2016-11-14 MED ORDER — OXYCODONE HCL 5 MG PO TABS *I*
ORAL_TABLET | ORAL | Status: AC
Start: 2016-11-14 — End: 2016-11-14
  Filled 2016-11-14: qty 2

## 2016-11-14 MED ORDER — HEPARIN SODIUM 5000 UNIT/ML SQ *I*
5000.0000 [IU] | Freq: Three times a day (TID) | SUBCUTANEOUS | Status: DC
Start: 2016-11-14 — End: 2016-11-16
  Administered 2016-11-14 – 2016-11-16 (×5): 5000 [IU] via SUBCUTANEOUS
  Filled 2016-11-14 (×5): qty 1

## 2016-11-14 MED ORDER — HYDROMORPHONE HCL 2 MG/ML IJ SOLN *WRAPPED*
INTRAMUSCULAR | Status: AC
Start: 2016-11-14 — End: 2016-11-14
  Administered 2016-11-14: 0.5 mg via INTRAVENOUS
  Filled 2016-11-14: qty 1

## 2016-11-14 MED ORDER — ONDANSETRON HCL 2 MG/ML IV SOLN *I*
INTRAMUSCULAR | Status: AC
Start: 2016-11-14 — End: 2016-11-14
  Filled 2016-11-14: qty 2

## 2016-11-14 MED ORDER — LIDOCAINE HCL 2 % IJ SOLN *I*
INTRAMUSCULAR | Status: DC | PRN
Start: 2016-11-14 — End: 2016-11-14
  Administered 2016-11-14: 60 mg via INTRAVENOUS

## 2016-11-14 MED ORDER — PLASMA-LYTE IV SOLN *WRAPPED*
Status: DC | PRN
Start: 2016-11-14 — End: 2016-11-14

## 2016-11-14 MED ORDER — ALBUTEROL SULFATE (2.5 MG/3ML) 0.083% IN NEBU *I*
2.5000 mg | INHALATION_SOLUTION | Freq: Once | RESPIRATORY_TRACT | Status: DC | PRN
Start: 2016-11-14 — End: 2016-11-14

## 2016-11-14 MED ORDER — HALOPERIDOL LACTATE 5 MG/ML IJ SOLN *I*
0.5000 mg | Freq: Once | INTRAMUSCULAR | Status: AC | PRN
Start: 2016-11-14 — End: 2016-11-14
  Administered 2016-11-14: 0.5 mg via INTRAVENOUS

## 2016-11-14 MED ORDER — OXYCODONE HCL 5 MG PO TABS *I*
5.0000 mg | ORAL_TABLET | ORAL | Status: DC | PRN
Start: 2016-11-14 — End: 2016-11-16

## 2016-11-14 MED ORDER — CEFAZOLIN 2000 MG IN STERILE WATER 20ML SYRINGE *I*
PREFILLED_SYRINGE | INTRAVENOUS | Status: DC
Start: 2016-11-14 — End: 2016-11-14
  Filled 2016-11-14: qty 20

## 2016-11-14 MED ORDER — LACTATED RINGERS IV SOLN *I*
20.0000 mL/h | INTRAVENOUS | Status: DC
Start: 2016-11-14 — End: 2016-11-14
  Administered 2016-11-14: 20 mL/h via INTRAVENOUS

## 2016-11-14 MED ORDER — HYDROMORPHONE HCL PF 1 MG/ML IJ SOLN *WRAPPED*
INTRAMUSCULAR | Status: DC | PRN
Start: 2016-11-14 — End: 2016-11-14
  Administered 2016-11-14: .2 mg via INTRAVENOUS

## 2016-11-14 MED ORDER — OXYCODONE HCL 5 MG PO TABS *I*
10.0000 mg | ORAL_TABLET | ORAL | Status: DC | PRN
Start: 2016-11-14 — End: 2016-11-16
  Administered 2016-11-14: 10 mg via ORAL

## 2016-11-14 MED ORDER — EPHEDRINE 5MG/ML IN NS IV/IJ *WRAPPED*
INTRAMUSCULAR | Status: DC | PRN
Start: 2016-11-14 — End: 2016-11-14
  Administered 2016-11-14: 2.5 mg via INTRAVENOUS
  Administered 2016-11-14 (×4): 5 mg via INTRAVENOUS

## 2016-11-14 MED ORDER — MIDAZOLAM HCL 1 MG/ML IJ SOLN *I* WRAPPED
INTRAMUSCULAR | Status: DC | PRN
Start: 2016-11-14 — End: 2016-11-14
  Administered 2016-11-14: 2 mg via INTRAVENOUS

## 2016-11-14 MED ORDER — ONDANSETRON HCL 2 MG/ML IV SOLN *I*
INTRAMUSCULAR | Status: DC | PRN
Start: 2016-11-14 — End: 2016-11-14
  Administered 2016-11-14: 4 mg via INTRAMUSCULAR

## 2016-11-14 MED ORDER — MAGNESIUM SULFATE 2 GM IN 50 ML *WRAPPED*
2000.0000 mg | Freq: Once | INTRAVENOUS | Status: AC
Start: 2016-11-15 — End: 2016-11-15
  Administered 2016-11-15: 2000 mg via INTRAVENOUS
  Filled 2016-11-14: qty 50

## 2016-11-14 MED ORDER — HYDROMORPHONE HCL 2 MG/ML IJ SOLN *WRAPPED*
INTRAMUSCULAR | Status: AC
Start: 2016-11-14 — End: 2016-11-14
  Filled 2016-11-14: qty 1

## 2016-11-14 MED ORDER — SUGAMMADEX SODIUM 100 MG/1ML IV SOLN *WRAPPED*
INTRAVENOUS | Status: DC | PRN
Start: 2016-11-14 — End: 2016-11-14
  Administered 2016-11-14: 64.7 mg via INTRAVENOUS

## 2016-11-14 MED ORDER — FENTANYL CITRATE 50 MCG/ML IJ SOLN *WRAPPED*
INTRAMUSCULAR | Status: AC
Start: 2016-11-14 — End: 2016-11-14
  Filled 2016-11-14: qty 2

## 2016-11-14 MED ORDER — SODIUM CHLORIDE 0.9 % IV SOLN WRAPPED *I*
Status: DC | PRN
Start: 2016-11-14 — End: 2016-11-14
  Administered 2016-11-14: 1000 mL

## 2016-11-14 MED ORDER — HALOPERIDOL LACTATE 5 MG/ML IJ SOLN *I*
INTRAMUSCULAR | Status: AC
Start: 2016-11-14 — End: 2016-11-14
  Filled 2016-11-14: qty 1

## 2016-11-14 MED ORDER — HYDROMORPHONE HCL 2 MG/ML IJ SOLN *WRAPPED*
0.5000 mg | INTRAMUSCULAR | Status: DC | PRN
Start: 2016-11-14 — End: 2016-11-15

## 2016-11-14 MED ORDER — LABETALOL HCL 20 MG/4 ML IV SOLN WRAPPED *I*
5.0000 mg | INTRAVENOUS | Status: DC | PRN
Start: 2016-11-14 — End: 2016-11-14

## 2016-11-14 MED ORDER — ONDANSETRON HCL 2 MG/ML IV SOLN *I*
4.0000 mg | Freq: Four times a day (QID) | INTRAMUSCULAR | Status: DC | PRN
Start: 2016-11-14 — End: 2016-11-16
  Administered 2016-11-14 – 2016-11-15 (×3): 4 mg via INTRAVENOUS
  Filled 2016-11-14 (×2): qty 2

## 2016-11-14 MED ORDER — NEOMYCIN SULFATE IRRIGATION 0.2% *I*
Status: AC
Start: 2016-11-14 — End: 2016-11-14
  Filled 2016-11-14: qty 1000

## 2016-11-14 MED ORDER — PROMETHAZINE HCL 25 MG/ML IJ SOLN *I*
6.2500 mg | Freq: Once | INTRAMUSCULAR | Status: DC | PRN
Start: 2016-11-14 — End: 2016-11-14

## 2016-11-14 MED ORDER — SODIUM CHLORIDE 0.9 % INJ (FLUSH) WRAPPED *I*
3.0000 mL | Freq: Three times a day (TID) | Status: DC
Start: 2016-11-14 — End: 2016-11-16
  Administered 2016-11-14 – 2016-11-16 (×5): 3 mL via INTRAVENOUS

## 2016-11-14 MED ORDER — MIDAZOLAM HCL 1 MG/ML IJ SOLN *I* WRAPPED
INTRAMUSCULAR | Status: AC
Start: 2016-11-14 — End: 2016-11-14
  Filled 2016-11-14: qty 2

## 2016-11-14 MED ORDER — HEPARIN SODIUM (PORCINE) 1000 UNIT/ML IJ SOLN *WRAPPED*
Status: DC | PRN
  Administered 2016-11-14: 9000 [IU] via INTRAVENOUS
  Administered 2016-11-14: 2000 [IU] via INTRAVENOUS

## 2016-11-14 MED ORDER — ROCURONIUM BROMIDE 10 MG/ML IV SOLN *WRAPPED*
Status: DC | PRN
Start: 2016-11-14 — End: 2016-11-14
  Administered 2016-11-14: 10 mg via INTRAVENOUS
  Administered 2016-11-14: 40 mg via INTRAVENOUS
  Administered 2016-11-14 (×2): 10 mg via INTRAVENOUS

## 2016-11-14 MED ORDER — SODIUM CHLORIDE 0.9 % IV SOLN WRAPPED *I*
100.0000 mL/h | Status: AC
Start: 2016-11-14 — End: 2016-11-15
  Administered 2016-11-14: 100 mL/h via INTRAVENOUS

## 2016-11-14 MED ORDER — SIMVASTATIN 40 MG PO TABS *I*
40.0000 mg | ORAL_TABLET | Freq: Every day | ORAL | Status: DC
Start: 2016-11-14 — End: 2016-11-16
  Administered 2016-11-14 – 2016-11-15 (×2): 40 mg via ORAL
  Filled 2016-11-14 (×3): qty 1

## 2016-11-14 MED ORDER — NITROGLYCERIN 5 MG/ML IV SOLN *I*
INTRAVENOUS | Status: DC | PRN
  Administered 2016-11-14: 20 ug via INTRAVENOUS
  Administered 2016-11-14: 40 ug via INTRAVENOUS

## 2016-11-14 MED ORDER — HYDROMORPHONE HCL 2 MG/ML IJ SOLN *WRAPPED*
0.5000 mg | INTRAMUSCULAR | Status: DC | PRN
Start: 2016-11-14 — End: 2016-11-14
  Administered 2016-11-14: 0.5 mg via INTRAVENOUS

## 2016-11-14 SURGICAL SUPPLY — 59 items
ADHESIVE SKIN CLOSURE 0.7ML DERMABOND ADVANCED (Dressing) ×4 IMPLANT
APPLICATOR CHLORAPREP 26ML ORANGE LARGE (Solution) ×6 IMPLANT
BOOT SUTR STD LF YELLOW (Supply) ×2 IMPLANT
CATH BLN CODA 9F 120CM X 32MM (Cardiac Catheter (Interventional)) ×2 IMPLANT
CATH BLN MUSTANG 10  X 40  135CM (Supply) ×4 IMPLANT
CATH IMAGER .038 5F 65CM BERN (Cardiac Catheter (Interventional)) ×4 IMPLANT
CATH MARKER PIG 5FR X 70CM (Cardiac Catheter (Interventional)) ×2 IMPLANT
DEVICE INFL 20ML HI PRSS STPCOCK ENCORE 26 (Other) ×2 IMPLANT
DRAPE C-ARM 42 IN X 120 IN (Drape) ×2 IMPLANT
DRESSING OPSITE 6IN X 3 3/8IN (Dressing) ×4 IMPLANT
FILTER NEPTUNE 4PORT MANIFOLD (Supply) ×4 IMPLANT
GLOVE BIOGEL PI MICRO IND UNDER SZ 7.0 LF (Glove) ×4 IMPLANT
GLOVE BIOGEL PI MICRO SZ 7 (Glove) ×4 IMPLANT
GLOVE BIOGEL PI PRO-FIT SZ 7.5 LF (Glove) ×6 IMPLANT
GLOVE SURG PROTEXIS PI 6.5 PF SYN (Glove) ×2 IMPLANT
GOWN SIRIUS RAGLAN NONREINFORCED L (Gown) ×4 IMPLANT
GOWN SIRIUS RAGLAN NONREINFORCED XL (Gown) ×6 IMPLANT
GRAFT EVAR L96MM DIA26MM SHTH OD7.1MM ID6MM AORT VES DIA22MM WVN POLY FAB SS POLYPR SUT (Implant) ×2 IMPLANT
GRAFT ZENITH FLEX SPIRAL Z AAA 9X90 (Implant) ×2 IMPLANT
GRAFT ZENITH FLEX SPIRAL-Z AAA 13 X 56 (Implant) ×2 IMPLANT
GUIDEWIRE BENTSON .035IN X 150CM (Guidewire) ×4 IMPLANT
GUIDEWIRE GLIDE STD ANGL 3 X .035 X 180 (Guidewire) ×4 IMPLANT
GUIDEWIRE LUNDERQUIST CRV (Guidewire) ×4 IMPLANT
GUIDEWIRE PLATINUM PLUS GW ST .018/60CM (Guidewire) ×2 IMPLANT
INFLATOR ENCORE (Other) ×2
KIT FLOSEAL FULL STERILE PREP 5ML (Supply) ×4 IMPLANT
LOOP VASC MAXI WHITE (Supply) ×4 IMPLANT
LOOP VESSEL WHITE 30IN LF (Supply) ×4 IMPLANT
NEEDLE ANGIO 18GA L70CM OD1.27MM ID0.84MM GWIRE 0.038IN FEM ENTRY SGL PC (Needle) ×1 IMPLANT
NEEDLE SGL ENTRY (Needle) ×1
PACK CUSTOM MAJOR VASCULAR CDS (Pack) ×2 IMPLANT
PACK CUSTOM VASCULAR ANGIO PK (Pack) ×2 IMPLANT
PACK CUSTOM ~~LOC~~ TIBURON (Pack) ×2 IMPLANT
PACK TOWEL ORTHO LIGHT BLUE STER (Supply) ×2 IMPLANT
PATCH BOVINE PERICARDIAL 1CM X 6CM (Tissue) ×2 IMPLANT
SET ENDOVASC DIL 16/18FR (Sheath) ×2 IMPLANT
SET ENDOVASC DIL 20/22FR (Sheath) ×2 IMPLANT
SET ENDOVASCULAR DIL 14FR (Sheath) ×2 IMPLANT
SET MICROPUNCTURE SS 5FRX10X7CM (Supply) ×2 IMPLANT
SHEATH INTRO 12FR X 30CM (Sheath) ×2 IMPLANT
SHEATH INTRO 14FR X 30 CM (Sheath) ×2 IMPLANT
SHEATH PINNACLE 6FRX10CM RO (Cardiac Catheter (Interventional)) ×4 IMPLANT
SHIELD RADPAD PERIPH (Supply) ×4 IMPLANT
SOL SOD CHL IRRIG 1500ML BTL (Solution) ×6 IMPLANT
SPONGE SURGICEL ABS 4 X 8IN (Sponge) IMPLANT
SPONGE SURGIFOAM GELATIN 100 (Sponge) IMPLANT
STAPLER SKIN 35W NL (Supply) ×1
STAPLER SKIN WIDE STPL LEG L3.9MM WIRE DIA0.58MM FIX HD PROX (Supply) ×1 IMPLANT
STOPCOCK 4 WY W LL P167010 (Supply) ×4 IMPLANT
STOPCOCK MORSE 3WAY HI PRESSURE (Supply) ×4 IMPLANT
SUTR MONCRYL 4-0 PS-2 UND 27IN (Suture) ×8 IMPLANT
SUTR PROLENE 5-0 RB-2 BL MONO (Suture) ×14 IMPLANT
SUTR PROLENE 6-0 4X30IN BV DBL ARM BLUE (Suture) ×2 IMPLANT
SUTR PROLENE 6-0 BV-1 30IN BLUE (Suture) ×8 IMPLANT
SUTR VICRYL ANTIB 3-0 SH 27 UNDY (Suture) ×8 IMPLANT
SUTR VICRYL PLUS 2-0 1X36IN CT-1 UNDY (Suture) ×22 IMPLANT
SYRINGE QUICK FILL 150ML (Supply) ×2 IMPLANT
TUBING INJECTION CONTRAST 48 IN (Tubing) ×4 IMPLANT
VISE MULTI TORQUE .014-.038 IN (Supply) ×2 IMPLANT

## 2016-11-14 NOTE — Progress Notes (Signed)
Vascular Surgery Post-Operative Progress Note    Subjective:     Patient tolerated the procedure well.   NPO with some nausea, relieved with zofran, without vomiting.   Having some PVCs, remains asx. Denies CP, SOB.  Denies F/C or abdominal pain.   Foley in place draining yellow urine; -flatus, -BM.   -OOB.     Objective:      BP: (112-141)/(59-74)   Temp:  [36 C (96.8 F)-37.4 C (99.3 F)]   Temp src: Temporal (11/14 2100)  Heart Rate:  [67-85]   Resp:  [11-18]   SpO2:  [96 %-100 %]   Height:  [162.6 cm (5\' 4" )]   Weight:  [64.7 kg (142 lb 10.2 oz)]     Intake/Output  I/O this shift:  11/14 1500 - 11/14 2259  In: 900 (13.9 mL/kg) [I.V.:900]  Out: 975 (15.1 mL/kg) [Urine:775; Blood:200]  Net: -75  Weight: 64.7 kg     CMP  No results for input(s): NA, K, CL, CO2, CREAT, ALT, AST, APHOS, TB, DB, AMY, LIP in the last 168 hours.    No components found with this basename: BUN, LABGLOM, GLUCOSE, CALCIUM,  LAC    Coags  No results for input(s): INR, PTT in the last 168 hours.    No components found with this basename: APTT    CBC    Recent Labs  Lab 11/14/16  1509 11/14/16  1328   Hemoglobin 11.4 12.3       Physical Exam:      General appearance: alert, no distress and smiling  HEENT: PERRLA, extra ocular movement intact and sclera clear, anicteric  CV: regular rate and rhythm, S1, S2 normal, no murmur, click, rub or gallop  Lungs:clear  Abd: soft, non-tender. Bowel sounds normal. No masses,  no organomegaly   Extremities:  R groin with dressing in place without strikethrough, soft, without ecchymosis or hematoma   R DP biphasic  R PT biphasic   Cap refill 2s  Motor/sensory intact bilaterally     L  groin with dressing in place without strikethrough, soft, without ecchymosis or hematoma   L DP biphasic   L PT biphasic  Cap refill 2s  Motor/sensory intact bilaterally     Neuro: normal without focal findings, mental status, speech normal, alert and oriented x3, PERLA and reflexes normal and symmetric    Assessment:    [name] is a 76 y.o. female with PMH significant for HLD, chronic back pain, sciatica, herniated discs, GERD, and 5.1cm infrarenal AAA who is s/p EVAR, bilateral common femoral artery exposures, right common femoral endarterectomy and bovine pericardial patch angioplasty and left common femoral artery primary repair on 11/14/16. She tolerated the procedure well .     Plan:     E/F:  BMP, Mg, Phos in PACU replete prn   Diet: low fat diet, discontinue IVF when good PO intake   Cardiac: losartan, simvastatin  Pulmonary: pulmonary toilet, wean O2 as tolerated   Uro: continue foley, monitor I&O   Abx: N/A   Anti-platelet: N/A   Pain control: ATC tylenol, prn oxycodone, dilaudid   Dressing/incision/wound plan: continue bilateral groin dressings   DVT prophylaxis: SQH     Overall plan for the day:   Bedrest until 2200   Dopplers and CMS checks q4h   Monitor for PVCs , replete lytes prn   Diet as tolerated     Diagnoses for this hospitalization include:    Infrarenal abdominal aortic aneurysm   HLD  Please page the vascular surgery resident on call for any questions.  Darbi Chandran Marylou Flesher, MD   Vascular Surgery   11/14/2016 at 9:37 PM

## 2016-11-14 NOTE — Progress Notes (Signed)
UPDATES TO PATIENT'S CONDITION on the DAY OF SURGERY/PROCEDURE    I. Updates to Patient's Condition (to be completed by a provider privileged to complete a H&P, following reassessment of the patient by the provider):    Day of Surgery/Procedure Update:  History  History reviewed and no change    Physical  Physical exam updated and no change            II. Procedure Readiness   I have reviewed the patient's H&P and updated condition. By completing and signing this form, I attest that this patient is ready for surgery/procedure.    III. Attestation   I have reviewed the updated information regarding the patient's condition and it is appropriate to proceed with the planned surgery/procedure.    Turner Daniels, MD MPH as of 1:02 PM 11/14/2016

## 2016-11-14 NOTE — INTERIM OP NOTE (Signed)
Interim Op Note (Surgical Log ID: [id number])       Date of Surgery: 11/14/2016       Surgeons: Surgeon(s) and Role:     * Raman, Bjorn Loser, MD MPH - Primary     * Maximiano Coss, MD - Assisting     * Virl Cagey Willaim Bane, MD - Assisting       Pre-op Diagnosis: Pre-Op Diagnosis Codes:     * AAA (abdominal aortic aneurysm) without rupture [I71.4]       Post-op Diagnosis: Post-Op Diagnosis Codes:     * AAA (abdominal aortic aneurysm) without rupture [I71.4]       Procedure(s) Performed: EVAR (3-piece cook)  Bilateral common femoral artery exposures  Right common femoral endarterectomy and bovine pericardial patch angioplasty  Left common femoral artery primary repair               Anesthesia Type: General        Fluid Totals: I/O this shift:  11/14 1500 - 11/14 2259  In: 900 (13.9 mL/kg) [I.V.:900]  Out: 685 (10.6 mL/kg) [Urine:525; Blood:160]  Net: 215  Weight: 64.7 kg        Estimated Blood Loss: Blood Loss: 160 mL       Specimens to Pathology:  ID Type Source Tests Collected by Time Destination   A : right femoral plaque TISSUE Tissue SURGICAL PATHOLOGY Harlene Salts, RN 11/14/2016 1649           Temporary Implants:        Packing:                 Patient Condition: good       Findings (Including unexpected complications): Bilateral phasic dp/pt     Signed:  Maximiano Coss, MD  on 11/14/2016 at 6:12 PM     Vascular Quality Initiative -Endo AAA Repair     Does this procedure meet VQI Endovascular AAA Repair inclusion requirements?     Mark YES below if this is a primary endovascular repair of a degenerative infrarenal aortic aneurysm that may include iliac aneurysms. Uncovered stent grafts extending above the renal arteries are included. Also included are endovascular AAA repairs that are converted to open AAA repair during the index procedure.      Mark NO if this procedure was any of the following:   Revision of a previous endograft.   Supra-renal covered stents with renal fenestrations or branches (Mark NO  below and please insert the [facility] SUR VASC VQI TEVAR PROCEDURE smart text into this note.).   Repair of an isolated iliac aneurysm.  (Mark NO below and please insert the  [facility] SUR VASC VQI PERIPHERAL VASCULAR INTERVENTION smart text into this note.)    Repair done for trauma.   Operation done for an infected aneurysm or an anastomotic aneurysm (pseudoaneurysm).   Operation done for non-aneurysmal infrarenal pathology, such as an isolated dissection (mark "NO" below) or atherosclerotic occlusive disease (mark "NO" below and please insert the [facility] SUR VASC VQI PERIPHERAL VASCULAR INTERVENTION smart text into this note.)    Yes     Patient History    Preop Functional Status: Fully active; able to carry on all predisease activities without restriction.  Genetic History: none  Prior Aortic Surgery: none  Unfit for Open AAA Repair?:  no  Maximum AAA Diameter: 52 mm  Right Iliac Aneurysm: none  Left Iliac Aneurysm: none  Aortic Neck Length:  25 mm  Aortic Neck Diameter:  22 mm  Aorta-Neck Length: <45 degrees  Neck-AAA Angle: <45 degrees        Procedure Information    Contrast:  63 ml  Fluoro time:  17.9 minutes  Crystalloid: 2800 ml    Right-sided access: Open femoral, transverse     Right-sided iliac adjunct: none    Left-sided access: Open femoral, transverse        Left-sided iliac adjunct:  none    Aortic main device:  bifurcated, infra-renal    Right iliac endpoint: Common  Left iliac endpoint: Common    Anchors Used?:  none    Coil Occlusion?: none    Devices  ______________________________________________________________    Main Aortic Device Manufacturer: Cook. Graft details: Zenith AAA Endovascular Graft Bifurcated Main Body.  Device Diameter: 26  Device Length:  96  ______________________________________________________________    Number of Proximal Aortic Extensions:  none  ______________________________________________________________    Number of RIGHT Iliac Limbs:  1.  Distal endpoint: Common     Iliac  Limb Detail    Device Manufacturer: Lacinda Axon. Graft details: Zenith AAA Endovascular Graft AAA Ancillary Component- Iliac.  Device Diameter: 13  Device Length:  46  Was this device planned for preoperatively?: Yes     ______________________________________________________________    Number of LEFT Iliac Limbs:  1.  Distal endpoint: Common     Iliac Limb Detail    Device Manufacturer: Lacinda Axon. Graft details: Zenith AAA Endovascular Graft AAA Ancillary Component- Iliac.  Device Diameter: 9  Device Length:  42  Was this device planned for preoperatively?: Yes     ______________________________________________________________    Intraoperative Complications/Endoleak:     Endoleak at completion?: lumbar branch (II)  Renal artery coverage?:  no  Iliac artery injury?: none  Iliac/Femoral Thrombectomy?: right femoral endarterectomy  Distal Embolectomy?: no  Conversion to open repair?:  no  Other complications?: no

## 2016-11-14 NOTE — Anesthesia Case Conclusion (Signed)
CASE CONCLUSION  Emergence  Actions:  Suctioned and soft bite block  Criteria Used for Airway Removal:  Acceptable O2 saturation, following commands, adequate Tv & RR, strong hand grip and 5 sec head lift  Assessment:  Routine  Transport  Directly to: PACU  Airway:  Nasal cannula  Oxygen Delivery:  2 lpm  Position:  Supine  Patient Condition on Handoff  Level of Consciousness:  Alert/talking/calm  Patient Condition:  Stable  Handoff Report to:  RN

## 2016-11-14 NOTE — Anesthesia Procedure Notes (Signed)
---------------------------------------------------------------------------------------------------------------------------------------    AIRWAY   GENERAL INFORMATION AND STAFF    Patient location during procedure: OR       Date of Procedure: 11/14/2016 1:41 PM  CONDITION PRIOR TO MANIPULATION     Current Airway/Neck Condition:  Normal        For more airway physical exam details, see Anesthesia PreOp Evaluation  AIRWAY METHOD     Patient Position:  Sniffing    Preoxygenated: yes      Induction: IV    Mask Difficulty Assessment:  1 - vent by mask      Mask NMB: 1 - vent by mask      Technique Used for Successful ETT Placement:  Direct laryngoscopy    Devices/Methods Used in Placement:  Intubating stylet    Blade Type:  Macintosh    Laryngoscope Blade/Video laryngoscope Blade Size:  3    Cormack-Lehane Classification:  Grade I - full view of glottis    Placement Verified by: capnometry, auscultation, palpation of cuff and equal breath sounds      Number of Attempts at Approach:  1    Number of Other Approaches Attempted:  0  FINAL AIRWAY DETAILS    Final Airway Type:  Endotracheal airway    Adjunct Airway: soft bite block    Final Endotracheal Airway:  ETT      Cuffed: cuffed    Insertion Site:  Oral    ETT Size (mm):  7.0    Distance inserted from Lips (cm):  22  ----------------------------------------------------------------------------------------------------------------------------------------

## 2016-11-14 NOTE — Addendum Note (Signed)
Addendum  created 11/14/16 2109 by Fannie Knee, DO    Order list changed

## 2016-11-14 NOTE — Anesthesia Procedure Notes (Addendum)
----------------------------------------------------------------------------------------------------------------------------------------    ARTERIAL LINE     Date of Procedure: 11/14/2016 1:45 PM  PLACEMENT     Final Insertion site: right radial    Catheter Length: 20G 4.45cm     Number of attempts:  1  INDICATIONS     Indications: Hemodynamic Monitoring    CONSENT AND TIMEOUT     Consent:  Obtained per policy    Timeout: patient identified (name/DOB) , proper patient position verified, needed equipment, monitors, medications and access verified as present and functioning , allergies reviewed with patient/record  and anticoagulation/antiplatelet status reviewed  PROCEDURE DETAILS       Patient Location: pre-op      Sedation used: no      Monitoring: continuous pulse ox with supplemental oxygen, continuous pulse ox and NIBP      Sterile Technique: hand washing, hand sanitizer, sterile gloves and mask      Site Preparation:  Chlorhexidine+isopropanol      Technique:  Arrow catheter, over wire and thru and thru    Guide Wire removed Intact      Placement confirmed by:  Transducer and pulsatile flow      Secured by:  Tape, suture, biopatch and transparent dressing  POST-PROCEDURE DETAILS     Complications:  None, well-tolerated    Patient condition at end of procedure:  No change from prior  STAFF     Performed by: extender under direct supervision    Attending Attestation: I was present for the entire procedure     Attending: Darin Engels ANN  Extender: Jacqulyn Bath  ----------------------------------------------------------------------------------------------------------------------------------------

## 2016-11-14 NOTE — OR Nursing (Signed)
Patient visited in [place]. Patient was alert and oriented. Confirmed patient identity with name and DOB. Confirmed NPO and consent. Patient confirmed correct procedure. Patient denied latex allergy. Patient confirmed medication/drug allergy listed in chart. Patient denied metal implants. ROM and Skin integrity WNL.

## 2016-11-14 NOTE — Anesthesia Postprocedure Evaluation (Addendum)
Anesthesia Post-Op Note    Patient: [name]    Procedure(s) Performed:  Procedure Summary  Date:  11/14/2016 Anesthesia Start: 11/14/2016  1:05 PM Anesthesia Stop: 11/14/2016  6:34 PM Room / Location:  [id number]_[id number] / [place] MAIN OR   Procedure(s):  AAA ENDOVASCULAR REPAIR, (EVAR), right femoral endarterectomy and patch angioplasty Diagnosis:  AAA (abdominal aortic aneurysm) without rupture [I71.4] Surgeon(s):  Raman, Bjorn Loser, MD MPH  Maximiano Coss, MD  Macie Burows, MD Attending Anesthesiologist:  Darin Engels ANN, MD         Recovery Vitals  BP: 124/62 (11/14/2016  7:45 PM)  Heart Rate: 68 (11/14/2016  7:45 PM)  Heart Rate (via Pulse Ox): 69 (11/14/2016  7:45 PM)  Resp: 17 (11/14/2016  7:45 PM)  Temp: 36 C (96.8 F) (11/14/2016  7:00 PM)  SpO2: 100 % (11/14/2016  7:45 PM)  O2 Flow Rate: 4 L/min (11/14/2016  7:45 PM)   0-10 Scale: 10 (11/14/2016  7:15 PM)  Anesthesia type:  General  Complications Noted During Procedure or in PACU:  None   Comment:    Patient Location:  PACU  Level of Consciousness:    Awake (Drowsy, but following commands and answering questions appropriately)  Patient Participation:     Able to participate  Temperature Status:    Normothermic  Oxygen Saturation:    Appropriate for condition  Cardiac Status:   Appropriate for condition and stable  Fluid Status:    Stable  Airway Patency:     Yes  Pulmonary Status:    Stable  Pain Management:    Adequate analgesia  Nausea and Vomiting:  None    Post Op Assessment:    Tolerated procedure well   Attending Attestation:  All indicated post anesthesia care provided       Complications Noted During Recovery Period:      None  Recovery from Anesthesia:      Recovered to baseline level of consciousness  Condition of patient:      Recovered to pre-anesthetic condition

## 2016-11-15 ENCOUNTER — Other Ambulatory Visit: Payer: Self-pay | Admitting: Vascular Surgery

## 2016-11-15 DIAGNOSIS — Z9889 Other specified postprocedural states: Secondary | ICD-10-CM

## 2016-11-15 DIAGNOSIS — Z8679 Personal history of other diseases of the circulatory system: Secondary | ICD-10-CM

## 2016-11-15 LAB — CBC AND DIFFERENTIAL
Baso # K/uL: 0 10*3/uL (ref 0.0–0.1)
Basophil %: 0.2 %
Eos # K/uL: 0 10*3/uL (ref 0.0–0.4)
Eosinophil %: 0 %
Hematocrit: 30 % — ABNORMAL LOW (ref 34–45)
Hemoglobin: 9.5 g/dL — ABNORMAL LOW (ref 11.2–15.7)
IMM Granulocytes #: 0 10*3/uL (ref 0.0–0.1)
IMM Granulocytes: 0.4 %
Lymph # K/uL: 0.5 10*3/uL — ABNORMAL LOW (ref 1.2–3.7)
Lymphocyte %: 5.6 %
MCH: 31 pg/cell (ref 26–32)
MCHC: 32 g/dL (ref 32–36)
MCV: 97 fL — ABNORMAL HIGH (ref 79–95)
Mono # K/uL: 0.8 10*3/uL (ref 0.2–0.9)
Monocyte %: 8.7 %
Neut # K/uL: 7.6 10*3/uL — ABNORMAL HIGH (ref 1.6–6.1)
Nucl RBC # K/uL: 0 10*3/uL (ref 0.0–0.0)
Nucl RBC %: 0 /100 WBC (ref 0.0–0.2)
Platelets: 204 10*3/uL (ref 160–370)
RBC: 3.1 MIL/uL — ABNORMAL LOW (ref 3.9–5.2)
RDW: 12.9 % (ref 11.7–14.4)
Seg Neut %: 85.1 %
WBC: 9 10*3/uL (ref 4.0–10.0)

## 2016-11-15 LAB — BASIC METABOLIC PANEL
Anion Gap: 12 (ref 7–16)
Anion Gap: 14 (ref 7–16)
CO2: 15 mmol/L — ABNORMAL LOW (ref 20–28)
CO2: 22 mmol/L (ref 20–28)
Calcium: 4.8 mg/dL — CL (ref 8.6–10.2)
Calcium: 7.7 mg/dL — ABNORMAL LOW (ref 8.6–10.2)
Chloride: 119 mmol/L — ABNORMAL HIGH (ref 96–108)
Chloride: 105 mmol/L (ref 96–108)
Creatinine: 0.63 mg/dL (ref 0.51–0.95)
Creatinine: 0.97 mg/dL — ABNORMAL HIGH (ref 0.51–0.95)
GFR,Black: 100 *
GFR,Black: 65 *
GFR,Caucasian: 87 *
GFR,Caucasian: 56 * — AB
Glucose: 103 mg/dL — ABNORMAL HIGH (ref 60–99)
Glucose: 125 mg/dL — ABNORMAL HIGH (ref 60–99)
Lab: 9 mg/dL (ref 6–20)
Lab: 13 mg/dL (ref 6–20)
Potassium: 3 mmol/L — ABNORMAL LOW (ref 3.3–5.1)
Potassium: 4.7 mmol/L (ref 3.3–5.1)
Sodium: 146 mmol/L — ABNORMAL HIGH (ref 133–145)
Sodium: 141 mmol/L (ref 133–145)

## 2016-11-15 LAB — SURGICAL PATHOLOGY

## 2016-11-15 NOTE — Progress Notes (Signed)
Home Health Assessment    Completed by: Michi Herrmann M Blyss Lugar, RN  Phone: [phone]  Referred by: Mary Hayes, RN [facility]       Source of Information: E Record       Home Health indicators present: SN. PT       Barriers to discharge to be addressed: None noted at this time, will continue to assess.         Plan:  [facility] aware of home care referral. [facility] will  continue to follow progress towards discharge, and will assist in planning a safe discharge to  Home with appropriate services.    Solara Goodchild, RN   [facility]  [phone]  Weekends/Holidays: [phone]  Afterhours: [phone], Option 4

## 2016-11-15 NOTE — Progress Notes (Signed)
Report Given To  Lupe RN [phone]      Descriptive Sentence / Reason for Admission   PMHx:HLD, chronic back pain, Sciatica, herniated discs, GERD. Smoking history: former smoker, quit in 2015.    11/14: EVAR for 5.1cm AAA               Active Issues / Relevant Events   Neuro: A&O x 3  Telemetry: NSR  Pulmonary: RA  GI/Nutrition:Low Fat Diet  (-) gas - BM     Renal: Foley d/c 0700  11/15  Endocrine: n/a  Mobility:  1 assist  IVF: Sodium Chloride a 100 ml/hr  Pain control: PRN and oxy  IV access: PIV L hand and forearm Right wrist  Wounds: R and L groin   Miscellaneous:   Nausea o/n          To Do List  -Q4 cms/ doppler  -pain control  -DTV at 14:30        Anticipatory Guidance / Discharge Planning  When medically stable

## 2016-11-15 NOTE — Progress Notes (Signed)
Patient arrived onto unit from PACU at 22:00. Patient is A&O x3 and vitals are stable. Patients neuro/cms is intact. Patients pulses in the upper extremities are palpable and doppler in the lower extremities. Patient is 2 groin sites that are soft and non tender. 4 eyed skin check was performed with Olen Cordial. Patient is NSR on telemetry. Patient was introduced to the call light system. Will continue to monitor.

## 2016-11-15 NOTE — Progress Notes (Signed)
Vascular Surgery Daily Progress Note    Subjective:     Afebrile, HR 67-85, BP 112/61-142/64, saturating well on 2L NC  Few PVC's overnight--cross-cover notified, BMP checked and magnesium repleted for 1.8  Complains of left thoracic back discomfort since OR, similar to what she has experienced prior to surgery (sees physical therapy for back exercises for pain). Denies chest pain, abdominal pain, SOB. Denies lower extremity weakness, numbness, or tingling.   Some nausea after Oxycodone yesterday, has since resolved.   Tolerating clears, on low-fat diet. Denies flatus.   Not OOB yet.     Objective:      BP: (112-142)/(55-74)   Temp:  [36 C (96.8 F)-37.4 C (99.3 F)]   Temp src: Temporal (11/15 0458)  Heart Rate:  [67-85]   Resp:  [11-18]   SpO2:  [96 %-100 %]   Height:  [162.6 cm (5\' 4" )]   Weight:  [64.7 kg (142 lb 10.2 oz)]     Intake/Output  No intake/output data recorded.    CMP    Recent Labs  Lab 11/15/16  0333 11/15/16  0124 11/14/16  2144   Sodium 141 146* 143   Potassium 4.7 3.0* 4.2   Chloride 105 119* 106   CO2 22 15* 20   Creatinine 0.97* 0.63 0.84       No components found with this basename: BUN, LABGLOM, GLUCOSE, CALCIUM,  LAC    Coags  No results for input(s): INR, PTT in the last 168 hours.    No components found with this basename: APTT    CBC    Recent Labs  Lab 11/15/16  0124 11/14/16  1509 11/14/16  1328   WBC 9.0  --   --    Hemoglobin 9.5* 11.4 12.3   Hematocrit 30*  --   --    Platelets 204  --   --        Physical Exam:      General appearance: alert, no distress and smiling  HEENT: PERRLA, extra ocular movement intact and sclera clear, anicteric  CV: regular rate and rhythm, S1, S2 normal, no murmur, click, rub or gallop  Lungs: non-labored respirations on 2L NC  Abd: softly distended, tympanitic, non-tender. No masses,  no organomegaly   Extremities:  R groin with dressing in place without strikethrough, soft, without ecchymosis or hematoma   R DP biphasic  R PT biphasic   Cap refill  2s  Motor/sensory intact bilaterally     L  groin with dressing in place without strikethrough, soft, without ecchymosis or hematoma   L DP biphasic   L PT biphasic  Cap refill 2s  Motor/sensory intact bilaterally     Neuro: normal without focal findings and mental status, speech normal, alert and oriented x3    Assessment:   [name] is a 76 y.o. female with PMH significant for HLD, chronic back pain, sciatica, herniated discs, GERD, and 5.1cm infrarenal AAA who is s/p EVAR, bilateral common femoral artery exposures, right common femoral endarterectomy and bovine pericardial patch angioplasty and left common femoral artery primary repair on 11/14/16. She tolerated the procedure well .     Plan:     E/F:  Replete prn  Diet: low fat diet  Cardiac: losartan, simvastatin  Pulmonary: pulmonary toilet, wean O2 this morning   Uro: Discontinue foley  Abx: N/A   Anti-platelet: ASA 81 mg  Pain control: ATC tylenol, prn oxycodone, dilaudid   Dressing/incision/wound plan: continue bilateral groin dressings  DVT prophylaxis: SQH     Overall plan for the day:   Out of bed, ambulate  Wean supplemental O2  Void trial  Dopplers and CMS checks q4h   Home soon    Diagnoses for this hospitalization include:    Infrarenal abdominal aortic aneurysm   HLD     Please page the vascular surgery resident on call for any questions.  Blain Pais, MD   Vascular Surgery   11/15/2016 at 6:33 AM

## 2016-11-15 NOTE — Progress Notes (Signed)
11/15/16 1100   [facility] Patient Class Review   Patient Class Review Inpatient   Patient Class Effective 11/14/2016.    Signa Kell, RN

## 2016-11-15 NOTE — Progress Notes (Signed)
Report Given To  [facility] [phone]      Descriptive Sentence / Reason for Admission   PMHx:HLD, chronic back pain, Sciatica, herniated discs, GERD. Smoking history: former smoker, quit in 2015.    11/14: EVAR for 5.1cm AAA      Active Issues / Relevant Events   Neuro: A&O x 3  Telemetry: NSR  Pulmonary: RA  GI/Nutrition:Low Fat Diet  (-) gas - BM     Renal: spont  Endocrine: n/a  Mobility:  1 assist no device  IVF: none  Pain control: tylenol atc, oxy PRN but makes her nauseous   IV access: PIV L hand, L forearm, Right wrist  Wounds: R and L groin, with op site and transparent   Miscellaneous:   Nausea intermittently  Some burning on L groin/thigh, looks like razor burn      To Do List  -Q4 cms/ doppler  -pain control        Anticipatory Guidance / Discharge Planning  When medically stable

## 2016-11-15 NOTE — Progress Notes (Signed)
Interdisciplinary Rounds Note    Date: 11/15/2016   Time: 2:15 PM   Attendance:  Care Coordinator, [facility] Nurse, Nurse Practitioner, Physical Therapist, Physician, Registered Nurse and Social Worker    Admit Date/Time:  11/14/2016  8:32 AM    Principal Problem: AAA (abdominal aortic aneurysm) without rupture  Problem List:   Patient Active Problem List    Diagnosis Date Noted    Essential hypertension 06/11/2016     Priority: High    Osteoporosis 10/15/2013     Priority: High     Fosamax started 2015?      Back pain 10/15/2013     Priority: High    Benign neoplasm of colon 11/03/2013     Priority: Medium     Removed via colonscopy 2012; told to repeat 2017       AAA (abdominal aortic aneurysm) 10/15/2013     Priority: Medium     Due for repeat U/S 09/2016 ; Noted 06/02/13: 3.8X3.5 cm partially thrombosed; 03/2016 stable at 4.77 cm- continued surveillance with vascular surgery                Hyperlipidemia 10/15/2013     Priority: Medium    Wears dentures 06/11/2016     Priority: Low    Patient has healthcare proxy 06/11/2016     Priority: Low     Carola Rhine (son), Wallene Huh (sister)       Gallstones 10/23/2013     Priority: Low     Noted U/S: 06/02/13: There is gallbladder sludge/crushed gallstones. No gallbladder wall thickening, no sonographic Murphy's sign. Common bile duct 44mm.       GERD (gastroesophageal reflux disease) 10/15/2013     Priority: Low    s/p EVAR & R CFEA 11/14/16 10/26/2016     Added automatically from request for surgery [id number]         The patient's problem list and interdisciplinary care plan was reviewed.    Discharge Planning                 *Does patient currently have home care services?: No     *Current External Services: None                      Plan: Spoke with pt confirmed demographics. Per pt she lives alone in a 2 story home bedroom and full bathroom are on the second floor. Pt was independent with ADLS and ambulation prior to admission and driving. Pt has a  son and sister that live 3 miles away and will be able to assist with needs and provide transportation, they will not be staying overnight with pt. Choiced pt to [facility], spoke with Barth Kirks she is aware of the referral.    Anticipated Discharge [place] medical readiness     Discharge Disposition: Home with Services

## 2016-11-16 MED ORDER — ONDANSETRON HCL 4 MG PO TABS *I*
4.0000 mg | ORAL_TABLET | Freq: Four times a day (QID) | ORAL | 0 refills | Status: DC | PRN
Start: 2016-11-16 — End: 2017-09-25

## 2016-11-16 MED ORDER — ONDANSETRON HCL 4 MG PO TABS *I*
4.0000 mg | ORAL_TABLET | Freq: Four times a day (QID) | ORAL | Status: DC | PRN
Start: 2016-11-16 — End: 2016-11-16
  Administered 2016-11-16: 4 mg via ORAL
  Filled 2016-11-16: qty 1

## 2016-11-16 MED ORDER — ACETAMINOPHEN 500 MG PO TABS *I*
500.0000 mg | ORAL_TABLET | Freq: Four times a day (QID) | ORAL | 0 refills | Status: AC | PRN
Start: 2016-11-16 — End: 2016-12-16

## 2016-11-16 NOTE — Discharge Instructions (Signed)
Brief Summary of your Hospital Stay (including key procedures and diagnostic test results):  You were admitted on 11/14/2016 for an endovascular repair of your abdominal aortic aneurysm and left common femoral artery (CFA) endarterectomy (opening the artery and removing plaque).  You tolerated the procedure well and postop your vital signs have remained stable.  Your pain has been well managed, you are tolerating a diet and the bilateral groin incisions are clean, dry and intact with moderate amount of bruising surrounding the left groin incision. There is no evidence of a hematoma (fluid, blood collection beneath the skin surrounding the incisions).   Physical therapy evaluation finds your ready and safe for discharge to home.    The plan is to discharge you home with services to follow up with.    Pending Labs: Surgical pathology    Call Dr. Melony Overly at [phone] for  Fever of 101F. or greater  Shaking chills  Nausea and / or vomiting  Uncontrolled pain  Bruising, redness, drainage or swelling from the incision site  If you cannot reach the provider above, call [phone] (24hs/day, 7 day/week) and ask to page the vascular surgery attending on call.    Other Instructions: None    Diet: Regular    Activity: Climb stairs as tolerated  Walking is encouraged  Do not lift heavy objects greater than 5 lbs  Avoid any strenuous exercise (including sexual activity) for the next 2-3 weeks    Wound Care: Take a shower, pat incision dry  Do not bathe or immerse in water for 2 weeks      Pain Management: Per medication list below.     It is important to keep your pain controlled so that you are able to take deep breaths and cough. However, the use of a narcotic pain medication can be constipating. Please continue to walk, drink plenty of fluids, and use an over the counter stool softener such as Colace (Docusate Sodium) and/or Senna as needed for constipation. Do not operate heavy machinery, drive, or make important  decisions while on narcotic pain medications because narcotics may impair your reflex speed and judgement. You may wean your narcotic pain medicines as tolerated to regular over-the-counter pain medicines such as acetaminophen (Tylenol - avoid if you have liver disease) or ibuprofen (Motrin, Advil - avoid if you have kidney disease). Be careful not to exceed 4000mg  of acetaminophen in a day from all sources (Tylenol with Codeine, Norco and Percocet contain acetaminophen).    If you have received sedative medication and/or general anesthesia they may make you drowsy for as long as 24 hours:  A) DO NOT drive or operate any machinery for 24 hours  B) DO NOT drink alcoholic beverages for 24 hours  C) DO NOT make major decisions, sign contracts, etc. for 24 hours    Smoking: Smoking can reduce the quality of your wound healing and increase your chances of wound infections, as well as increase your chances of developing chronic health problems or worsen conditions you already have. If you smoke, you should quit. Smoking cessation information is available for your review to help you quit. Medications to help you quit are available. Ask your doctor Merrilyn Puma, Richard, MD) if you would like to receive these medications.         Diabetes: If you are diabetic, check your blood sugar at least daily. Poor blood sugar control can lead to delayed wound healing. Follow up with Pilar Plate, MD to discuss your diabetic care regimen.  Hand washing:  Hand Washing is your first line of defense against infection in the hospital, out shopping, and at home. In an effort to Prevent  Infection, especially at your incision site(s), please continue to wash your hands with antibacterial soap and water  or  use the alcohol based hand sanitizers frequently.  Keeping a bottle of hand sanitizer near you at all times during your recovery period can save you from a very serious complication if you use it as suggested.  It is especially  important to remember to wash your hands before and after eating, using the bathroom, and taking care of your surgical site. Anyone who is helping you at home should be doing the same thing all of the time, especially if they are helping you in any way with your personal care , dressings, or meal preparation. This can be the beginning of developing some very good habits for you, your friends and loved ones! They care and we care too!    Prescriptions Ordered: There are rarely paper Rxs/perscriptions handed out  any longer.   Because we use the computer to order most of them now, Please, make sure you know where to pick up any prescriptions that may have been ordered for you electronically.  We want them filled and available for you to take them at home as you have been directed by your Nurse at your time of discharge from the hospital.    Nursing Education:  Written patient education materials reviewed/given to patient: aortic aneurysm repair discharge instructions, endarterectomy discharge instructions    To prevent risk of infection, especially by incision sites, continue to wash your hands frequently, especially after using the bathroom and when soiled.

## 2016-11-16 NOTE — Progress Notes (Signed)
HOME CARE DISCHARGE PLAN    The following services have been arranged with [facility] [phone]/ [phone]:  _XX_____Nursing   ______Physical Therapy   ______Occupational Therapy  ______Speech Therapist  ______Medical Social Worker  ______Home Health Aide Evaluation  ______Home Delivered Meals    The first home visit date for [facility] services will be scheduled within 24-48 hours of facility discharge, and pt/family are in agreement.    Special instructions for scheduling first visit Please call first before going out     Blood draw is not applicable ___X__    The patients identified caregiver is Micheline Chapman (son) [phone]    Focus of care and identified patient/caregiver teaching needs at home CP and incision assessment, Disease process and medication teaching.     Follow-up MD appts scheduled:  11/26/2016 4:00 PM Pilar Plate, MD [facility] [phone]   12/14/2016 1:50 PM RIS, RR CT1 (RRCT1) [facility] [phone]   12/14/2016 2:30 PM VASCULAR SURGERY, [facility] Dept of Wing of Vascular Surgery [phone]   12/14/2016 3:15 PM Raman, Bjorn Loser, MD MPH [facility] Dept of [place] of Vascular Surgery [phone]     Oneal Deputy, RN        The above arrangements are based on an in-hospital evaluation. It is short-term and will be re-evaluated by the [place] Nurse in the home on a regular basis, as per physicians order.

## 2016-11-16 NOTE — Plan of Care (Signed)
Problem: Impaired Ambulation  Goal: STG - IMPROVE AMBULATION  Outcome: Adequate for discharge Date Met: 11/16/16  Patient will ambulate 100-149 feet using no device with No assist (Independently)    Time frame: 1 Visit    Problem: Impaired Stair Navigation  Goal: STG - IMPROVE STAIR NAVIGATION  Outcome: Adequate for discharge Date Met: 11/16/16  Patient will navigate 8-11 steps with 1  rail(s) and no device and Modified independence     Time frame: 1 Visit

## 2016-11-16 NOTE — Progress Notes (Addendum)
Vascular Surgery Daily Progress Note    Subjective:     Afebrile, HR 72-78, BP 97/46-117/55, saturating well on RA  Complains of chronic back pain (when lying down or sitting in bed, not standing)  Reports intermittent nausea unrelated to meals, denies emesis. Tolerating regular diet.   +flatus, denies BM, denies constipation.  Voiding spontaneously with 1.8L UOP, ambulating    Objective:      BP: (97-117)/(46-81)   Temp:  [37 C (98.6 F)-37.7 C (99.9 F)]   Temp src: Temporal (11/16 0356)  Heart Rate:  [72-78]   Resp:  [16-20]   SpO2:  [95 %-100 %]     Intake/Output  No intake/output data recorded.    CMP    Recent Labs  Lab 11/15/16  0333 11/15/16  0124 11/14/16  2144   Sodium 141 146* 143   Potassium 4.7 3.0* 4.2   Chloride 105 119* 106   CO2 22 15* 20   Creatinine 0.97* 0.63 0.84       No components found with this basename: BUN, LABGLOM, GLUCOSE, CALCIUM,  LAC    Coags  No results for input(s): INR, PTT in the last 168 hours.    No components found with this basename: APTT    CBC    Recent Labs  Lab 11/15/16  0124 11/14/16  1509 11/14/16  1328   WBC 9.0  --   --    Hemoglobin 9.5* 11.4 12.3   Hematocrit 30*  --   --    Platelets 204  --   --        Physical Exam:      General appearance: alert, no distress   HEENT: PERRLA, extra ocular movement intact and sclera clear, anicteric  CV: regular rate and rhythm, S1, S2 normal, no murmur, click, rub or gallop  Lungs: non-labored respirations on RA  Abd: soft, mildly distended, less tympanitic, non-tender. No masses,  no organomegaly   Extremities:  R groin with dressing in place without strikethrough, soft, without ecchymosis or hematoma--removed and replaced with Exu-dry  R DP biphasic  R PT biphasic   Cap refill 2s  Motor/sensory intact bilaterally     L  groin with dressing in place without strikethrough, soft, without ecchymosis or hematoma--removed and replaced with Exu-dry  L DP palpable  L PT biphasic  Cap refill 2s  Motor/sensory intact bilaterally      Neuro: normal without focal findings and mental status, speech normal, alert and oriented x3    Assessment:   [name] is a 76 y.o. female with PMH significant for HLD, chronic back pain, sciatica, herniated discs, GERD, and 5.1cm infrarenal AAA who is s/p EVAR, bilateral common femoral artery exposures, right common femoral endarterectomy and bovine pericardial patch angioplasty and left common femoral artery primary repair on 11/14/16. She tolerated the procedure well .     Plan:     E/F:  Replete prn  Diet: low fat diet  Cardiac: losartan, simvastatin  Pulmonary: pulmonary toilet  Uro: Voiding spontaneously  Abx: N/A   Anti-platelet: ASA 81 mg  Pain control: ATC tylenol, prn oxycodone  Dressing/incision/wound plan: continue bilateral groin dressings  DVT prophylaxis: SQH     Overall plan for the day:   Out of bed, ambulate  Dopplers and CMS checks q4h   Home today    Diagnoses for this hospitalization include:    Infrarenal abdominal aortic aneurysm   HLD     Please page the vascular surgery  resident on call for any questions.  Blain Pais, MD   Vascular Surgery   11/16/2016 at 7:23 AM

## 2016-11-16 NOTE — Progress Notes (Signed)
Physical Therapy Initial Evaluation/Physical Therapy Discharge Note:    History of Present Admission: [name] is a 76 y.o. female with PMH significant for HLD, chronic back pain, sciatica, herniated discs, GERD, and 5.1cm infrarenal AAA who is s/p EVAR, bilateral common femoral artery exposures, right common femoral endarterectomy and bovine pericardial patch angioplasty and left common femoral artery primary repair on 11/14/16.    Past Medical History:   Diagnosis Date    AAA (abdominal aortic aneurysm)     Elevated LFTs     Unclear if this was related to statin, gallstones, alcohol consumption     Essential hypertension 06/11/2016    GERD (gastroesophageal reflux disease)     HLD (hyperlipidemia)     Hx of colonic polyps     Colonscopy 08/11/10; repeat 2017 per GI     Lumbar disc herniation 2006    L5-S1    Osteoporosis     Spondylosis     Urinary retention         Past Surgical History:   Procedure Laterality Date    COLONOSCOPY  2011    HYSTERECTOMY      left ovaries in place     TUBAL LIGATION         Personal factors affecting treatment/recovery:   Lives alone    Comorbidities affecting treatment/recovery:   none noted     Clinical presentation:   stable    Patient complexity:     low level as indicated by above stability of condition, personal factors, environmental factors and comorbidities in addition to their impairments found on physical exam.   11/16/16 1005   Prior Living    Prior Living Situation Reported by patient   Lives With Alone   Type of Home 2 Story home   Location of Bedrooms 2nd floor   Location of Bathrooms 2nd floor   Prior Function Level   Prior Function Level Reported by patient   Walking Independent   Walking assistive devices used None   PT Tracking   PT TRACKING PT Discontinue   Visit Number   Visit Number [facility]) / Treatment Day (HH) 0   Precautions/Observations   Precautions used Yes   LDA Observation Monitors   Fall Precautions General falls precautions   Pain  Assessment   *Is the patient currently in pain? Denies   Vision    Current Vision Wears corrective lenses   Cognition   Additional Comments Alert, following all commands   UE Assessment   UE Assessment Full range RUE AROM;Full range LUE AROM  (Strength WFL)   LE Assessment   LE Assessment Full AROM RLE;Full AROM LLE  (Strength WFL)   Sensation   Sensation No apparent deficit   Transfers   Transfers Tested   Sit to Stand Independent   Stand to sit Independent   Transfer Assistive Device none   Additional comments Completed transfer from recliner without difficulty. Patient initially required increased time to achieve neutral hip position   Mobility   Mobility Tested   Gait Pattern [facility]   Ambulation Assist Independent   Ambulation Distance (Feet) 120   Ambulation Assistive Device None   Stairs Assistance Modified independent (device)   Stair Management Technique One rail;Alternating pattern;Forwards   Number of Stairs 10   Additional comments Tolerated well, no overt loss of balance t/o   Balance   Balance Tested   Sitting - Static Independent    Standing - Static Independent   Standing - Dynamic Independent  PT AM-PAC Mobility   Turning over in bed? 4   Sitting down on and standing up from a chair with arms? 4   Moving from lying on back to sitting on the side of the bed? 4   Moving to and from a bed to a chair? 4   Need to walk in hospital room? 4   Climbing 3 - 5 steps with a railing? 4   Total Raw Score 24   Standardized Score 61.14   CMS 1-100% Score 0   Medicare Functional Limit Modifiers CH  0 % impaired, limited or restricted   Assessment   Brief Assessment Patient demonstrates adequate mobility skills to return home   Patient / Family Goal return home   Plan/Recommendation   Treatment Interventions No further PT interventions   PT Frequency none further   Hospital Stay Recommendations None   Discharge Recommendations Anticipate return to prior living arrangement   PT Discharge Equipment Recommended None    Assessment/Recommendations Reviewed With: Nursing;Patient;Care coordinator   Time Calculation   PT Timed Codes 0   PT Untimed Codes 10   PT Unbilled Time 0   PT Total Treatment 10   Plan and Onset date   Plan of Care Date 11/16/16   Treatment Start Date 11/16/16   PT Charges   $PT [facility] Charges Acute PT Eval Low Complexity - code 7161     Melvyn Neth, PT, DPT  Pager # [phone]

## 2016-11-16 NOTE — Addendum Note (Signed)
Addendum  created 11/16/16 1012 by Alphonzo Grieve, MD    Sign clinical note

## 2016-11-16 NOTE — Progress Notes (Signed)
Per vascular team pt will be discharged today, Noelle from [facility] is aware and was in to speak with pt. Pt's medication from [facility] was provided to pt. Physical therapy cleared pt for home discharge. Pt's son to provide transportation home.

## 2016-11-16 NOTE — Progress Notes (Signed)
Patient deemed adequate for discharge. IV removed, tele discharged. Patient dressed appropriately for weather. Patient medications arrived from pharmacy, all verified present by Probation officer. AVS packet reviewed with with patient. Discussed medication administration, follow-up appointments, hand hygiene, and wound care. All questions answered. Patient in wheelchair to discharge, to home by son.

## 2016-11-16 NOTE — Discharge Summary (Signed)
Name: [name] MRN: [id number] DOB: 1940/06/04     Admit Date: 11/14/2016   Date of Discharge: 11/16/2016     Patient was accepted for discharge to   [facility] [6]           Discharge Attending Physician: Turner Daniels, MD MPH      Hospitalization Summary    CONCISE NARRATIVE:       Name: [name]    MRN: [id number]  DOB: [birth year]  Admit Date: 11/14/2016  Date of Discharge: 11/16/2016    Discharge Attending Physician: Cathren Harsh  MD    Hospitalization Summary    CONCISE NARRATIVE: Delta is 76 y.o. year old female admitted on 11/14/2016 for an endovascular repair of her abdominal aortic aneurysm and a right common femoral artery (CFA) endarterectomy. Postoperative course was uneventful.    Upon discharge, [name] was experiencing minimal pain, tolerating a regular diet and ambulating independently.      Bilateral groin incision sites: C/D/I with moderate amount ecchymosis surrounding left groin incision.  No evidence of hematoma.     Re-operation required: no.    VQI Discharge Medications:  Is the patient being discharged on the following?     ASA: Yes  Statin: Yes  ACE inhibitor/ARB: Yes  P2Y12 Antagonist: No, for medical reason.  Beta blocker: No, for medical reason.  Anticoagulant: No, for medical reason.    [name] was discharged home (with home nursing services to [facility] ) and will follow up with PCP and Vascular Surgery as scheduled.          OR PROCEDURE: 11/14/2016 s/p EVAR with right common femoral artery (CFA) endarterectomy            SIGNIFICANT MED CHANGES: None        Signed: Artist Pais, NP  On: 11/16/2016  at: 1:42 PM

## 2016-11-16 NOTE — Progress Notes (Signed)
Patient interview -  [facility])    Home visit address/ contact phone confirmed: Address and phone are correct     Pets/ Smoking: No/NO    Home set up/ lives with: Alone in a multi story home      ADL needs:Independent prior    Current DME or supplies in home: [employer],     Medication management method: Independent prior, mediset     Pneumococcal vaccine given and date.. 12/2014           Influenza vaccine given and date..      Fall 2018    Community or [facility] programs active with patient: None     Confirmed Attending MD Dr. Melony Overly agrees to sign _X___all home care orders     Confirmed Attending MD office address for home care orders as:[address] [place] [address]  [phone]    MD office Care Manager or clinical contact ... Per Previous agreement     Insurance Information:  Patient/family informed of home care/ DME copays  __X  Patient has been screened for Medicare as a Secondary Payer [facility])  ___eligible for [facility]   __X_not eligible    MVA or WC case [place], [place]  [phone]  Weekends/Holidays: [phone]  Afterhours: [phone], Option 4

## 2016-11-18 NOTE — Op Note (Signed)
[name], [name]  MR #:  [id number]   CSN:  [id number] DOB:  08-17-1940    AGE:  76     SURGEON:  Hoy Register, MD  ASSISTANT:  First surgical assistant:  Rebecca Eaton, MD.  SURGERY DATE:  11/14/2016    ANESTHESIA:  General.    PREOPERATIVE DIAGNOSIS:  Infrarenal abdominal aortic aneurysm.    POSTOPERATIVE DIAGNOSIS:  Infrarenal abdominal aortic aneurysm.    OPERATIVE PROCEDURE:    1. Bilateral femoral artery cutdown.  2. Bilateral first-order arterial cannulation.  3. Aortogram and pelvic arteriogram.  4. Endoluminal repair of infrarenal abdominal aortic aneurysm with Zenith endovascular graft.  5. Bilateral iliac limb balloon kissing angioplasty with 10 x 40 mm Mustang balloons.  6. Right femoral endarterectomy and bovine pericardial patch angioplasty.  7. Retrograde femoral arteriogram and iliac arteriogram.    INDICATIONS FOR PROCEDURE:  This patient is a 76 year old woman who is incidentally known to have a 5.1 cm abdominal aortic aneurysm.  She underwent her preoperative evaluation which showed less than 50% carotid artery stenosis bilaterally and a stress test which showed no evidence of reversible ischemia.  Her CT scan appeared amenable to endoluminal repair; however, she has very diminutive iliac vessels, particularly on the right, as well as significant disease of the femoral arteries.  We discussed the risks and benefits of endoluminal repair of her abdominal aortic aneurysm with the patient and her brother in detail.  These included, but were not limited to, potential renal insufficiency, failure of the reconstruction, arterial occlusion, cardiopulmonary complications, and even death.    DESCRIPTION OF PROCEDURE:  The patient was brought to the operating room, placed supine on the operating room table.  General anesthesia was induced by Anesthesiology and 2 g of Ancef was administered preoperatively.  The patient's abdomen and lower extremities were shaved, prepped, and draped in usual  fashion.  Bilateral transverse femoral incisions were made and through these incisions the subcutaneous tissues were sharply and bluntly dissected in order to identify the common femoral arteries. These were identified just below the inguinal ligaments and vessel loops placed around them.  The arteries appeared extremely diminutive with some posterior plaque.  The patient was systemically anticoagulated with 8000 units of IV heparin and required a total of 13,000 units of IV heparin to maintain an ACT greater than 280 throughout the procedure.      The left common femoral artery was punctured.  A guidewire was advanced through her very tortuous iliac anatomy and then a 6-French sheath was placed into the iliac arterial system.  We advanced the vertebral catheter and a Glidewire to traverse the iliac anatomy and were able to advance this into the thoracic aorta.  Our Glidewire was exchanged for a Lunderquist wire which was placed in the thoracic aorta.  We also punctured the right common femoral artery, advanced a wire into the iliac system, placed a 6-French sheath into the iliac artery, advanced the vertebral catheter into the aorta and used a Glidewire to traverse her iliac anatomy on the right side as well.  We placed the vertebral catheter in the thoracic aorta and exchanged our Glidewire for a Lunderquist wire.  Given her small and diseased iliac systems bilaterally, we elected to serially dilate the iliac system.  We dilated the left system with the dilator for the 12-French sheath and the 14-French dilator and then placed a 12-French sheath into the aorta.  On the right side, we serially dilated with the dilator  for the 12-French sheath, a 14, 16 and 18-French dilators and these passed without difficulty.      Satisfied with these findings, we advanced the pigtail catheter from the left side to the level of the renal arteries and then obtained an aortogram and pelvic arteriogram.  This suggested that the  patient had single renal arteries which were patent.  There was an infrarenal neck without significant taper or angulation.  There was a large abdominal aortic aneurysm which did not extend through the aortic bifurcation and the iliac arteries were very diminutive.  The hypogastric and external iliac arteries were patent with significant atherosclerotic disease.      At this time, the main body of the device, a Zenith TFFB 26 x 49 ZT was advanced through the right side and positioned just below the renal arteries.  A magnified arteriogram was performed and the device was deployed just below the renal arteries without difficulty.  Prior to completion, another magnified view was obtained which showed patent renal arteries.  The device was deployed until the contralateral stump opened and the transrenal portion of the device was then deployed to stabilize the device just below the renal arteries.  At this point, a pigtail catheter was pulled down over a guidewire and using a directional guidewire and catheter, we cannulated the contralateral limb with minimal difficulty.  This was then exchanged for a Lunderquist wire.  A retrograde arteriogram was performed in a right anterior oblique configuration and the left hypogastric artery was visualized.  Contralateral extension of the Zenith endovascular graft ZSLE 9 x 90 ZT was advanced up the left side without difficulty and deployed with excellent overlap of components.  This was extended to the origin of the hypogastric artery.      We completely deployed the main body of the device and retrieved the introducer system.  A 14-French sheath was placed up into the right iliac system and a retrograde arteriogram was performed of the right iliac vessels in a left anterior oblique configuration.  Ipsilateral extension of the device, Zenith ZSLE 13 x 56 ZT, was advanced up the right side without difficulty and deployed with excellent component overlap.  At this point, a Coda  balloon was used to mold the proximal portion of the device into place and for the iliac bifurcation, we elected to perform kissing balloon angioplasty with 10 x 40 mm Mustang balloons given her diminutive iliac arteries.  We used the 10 x 40 balloon along limb and once we were satisfied with our ballooning, we advanced a pigtail catheter to the level of the renal arteries on 1 side and a vertebral catheter on the other.      The completion arteriogram was performed which suggested that the renal arteries were widely patent, the covered portion of the device started just below them, and there was no kinking or compression of the device.  Both hypogastric arteries were patent.  Both external iliac arteries had excellent flow and there were signs of a type 2 endoleak.      At this time, the sheath and guidewires were removed and the femoral arteries were clamped.  These were inspected and there were no signs of injury to the femoral vessels.  These were primarily closed with 5-0 Prolene suture.  Prior to completion of the femoral closures, the arteries were flushed proximally and distally and the closures were completed.  Flow was reestablished.  On her left side, there was excellent flow distally with good signals  in the feet.  However, on the right side, the patient appeared to have a water hammer pulse in the level of the femoral arteries, though she had flow distally.  We therefore elected to introduce a micropuncture needle into the femoral artery and perform a retrograde femoral arteriogram and iliac arteriogram.  This suggested that there was significant damage to the femoral artery below the level of our closure and we, therefore, elected to reopen the artery and perform a patch angioplasty.      We obtained control of the external iliac artery proximally and extended our incision distally and dissected out the superficial femoral and profunda femoris arteries and placed vessel loops around these vessels  respectively.  After verifying that the patient's ACT was adequately elevated, we clamped all these arteries and opened the femoral artery longitudinally.  We found that the posterior wall of the femoral vessel had been damaged, but also had significant plaque, and we used a Soil scientist to perform an endarterectomy.  We inspected the artery thoroughly and irrigated it with heparinized saline to verify that there were not loose pieces of plaque.  We closed the lateral aspects of our prior arteriotomy with an interrupted 6-0 Prolene suture and then brought into the field a bovine pericardial patch which we sewed into place with a running 6-0 Prolene suture.  Prior to completion of our femoral patch angioplasty, the external iliac, superficial femoral artery, and profunda femoris arteries were flushed and hemostasis was obtained with closure which was completed and flow was reestablished to the left leg.  The patient had excellent signals in the right foot and the water hammer pulse we had previously noticed was no longer present.      After verifying hemostasis, we closed the incisions in multiple layers using 2-0 and 3-0 Vicryl in the deep and subcutaneous layers and the skin was closed with 4-0 Monocryl.  Dermabond and a dressing were applied.  The patient tolerated the procedure well and went to the recovery room in stable condition.    Diagnostic aortogram and pelvic arteriogram before and after endovascular repair of abdominal aortic aneurysm and retrograde right iliac artery angiogram:  The aortogram suggested that the renal arteries were patent without significant stenosis.  The infrarenal neck was not angled significantly.  There was a large infrarenal aortic aneurysm extending to the bifurcation but not including it.  The common, external, and internal iliac arteries were patent with significant atherosclerotic disease burden.  Endovascular repair of the aortic aneurysm showed that the covered portion of  the device started just below the renal arteries.  There was no kinking or compression of the device along its length.  The hypogastric and external iliac arteries were patent without stenoses, and there is a type 2 endoleak noted.  Retrograde right femoral angiogram revealed that there was significant flow-limiting lesion in the right common femoral artery for which the patient underwent a femoral patch angioplasty.    SPECIMENS REMOVED:  None.    IV FLUIDS:  2.8 L crystalloid.    URINE OUTPUT:  525 cc.    ESTIMATED BLOOD LOSS:  200 cc.    FLUOROSCOPY TIME:  17 minutes and 9 seconds.    CONTRAST USED:  63 cc of Optiray.    PRESENCE STATEMENT:  As the attending surgeon, I was present and scrubbed for the entire procedure from beginning to end.    ANTIBIOTICS GIVEN:  The patient received 2 g of Ancef preoperatively and was redosed with additional  dose.         ___________________________  Hoy Register, MD    KGR/MODL  DD:  11/18/2016 16:33:42  DT:  11/18/2016 17:20:30  Job #:  [id number]    cc:

## 2016-11-19 ENCOUNTER — Telehealth: Payer: Self-pay

## 2016-11-19 NOTE — Telephone Encounter (Signed)
Juliann Pulse from [facility] called [phone]).  Patient reports 10/10 back pain since surgery, new right groin numbness since yesterday.  Patient is taking OTC Tylenol.  Please call patient.

## 2016-11-19 NOTE — Telephone Encounter (Signed)
Spoke with patient who notes chronic back pain prior to surgery and persistent discomforts following surgery. Notes pain from left shoulder down to left buttock and into center of back. Similar symptoms experienced post-op in hospital following surgery. Denies abdominal or leg pain. Tylenol unrelieving of discomforts, heating pad with some relief. Also notes right groin and inner thigh numbness noted yesterday following shower after rubbing the incision. Denies signs of infection. Denies leg pain, motor loss or discoloration. Discussed symptoms likely from nerve irration from femoral incision. Advised to continue warm compresses and will call tomorrow to check symptoms, if worsened patient instructed to call immediately.

## 2016-11-20 NOTE — Telephone Encounter (Signed)
Called patient for status update. Patient reports back pain improved today has had positive effects with OTC Advil and heating pack combined. Reports right inner thigh numbness is present, but has not worsened since yesterday. Patient ambulating without pain, or claudication. Denies abdominal pain. Is passing gas and small amount of stool, but no real BM since surgery. Instructed to continue with OTC laxatives, stool softeners daily until she has a BM. Encouraged fluid intake and food as tolerated. Call the office with any worsening pain symptoms. Patient notes understanding, agrees with plan.

## 2016-11-26 ENCOUNTER — Ambulatory Visit: Payer: Medicare (Managed Care) | Admitting: Primary Care

## 2016-11-29 ENCOUNTER — Ambulatory Visit: Payer: Medicare (Managed Care) | Attending: Primary Care | Admitting: Primary Care

## 2016-11-29 ENCOUNTER — Encounter: Payer: Self-pay | Admitting: Primary Care

## 2016-11-29 VITALS — BP 128/86 | HR 68 | Ht 64.02 in | Wt 144.8 lb

## 2016-11-29 DIAGNOSIS — G5711 Meralgia paresthetica, right lower limb: Secondary | ICD-10-CM

## 2016-11-29 DIAGNOSIS — I10 Essential (primary) hypertension: Secondary | ICD-10-CM

## 2016-11-29 DIAGNOSIS — M545 Low back pain, unspecified: Secondary | ICD-10-CM

## 2016-11-29 DIAGNOSIS — F40240 Claustrophobia: Secondary | ICD-10-CM

## 2016-11-29 DIAGNOSIS — I714 Abdominal aortic aneurysm, without rupture, unspecified: Secondary | ICD-10-CM

## 2016-11-29 MED ORDER — TRAMADOL HCL 50 MG PO TABS *I*
50.0000 mg | ORAL_TABLET | Freq: Four times a day (QID) | ORAL | 1 refills | Status: DC | PRN
Start: 2016-11-29 — End: 2017-07-12

## 2016-11-29 NOTE — Progress Notes (Signed)
[name] is 76 y.o. female presenting for follow up.  Per nursing note, Chief Complaint: Follow-up (f/u endo surgery)    76 y.o. female with hx of HTN, AAA, Osteoporosis; Hyperlipidemia; GERD, Back pain; Gallstones; Benign neoplasm of colon;    Today notes:     Hospitalized 11/14/16-11/16/16. In on Wed home Friday.   Procedure 11/14/16. endovascular repair of her abdominal aortic aneurysm and a right common femoral artery (CFA) endarterectomy.  Numbness R anterior thigh, pattern suggesting meralgia paresthetica.  5 hours on the table   Spent 5 hours before the procedure as the doctor had an emergency.   Back pain horrible throughout the stay   Oxy --> nausea/vomitting.   Tylenol, not helpful.  ----   Had cortisone shots in [place] helped a lot for 6-12 months.  This is like what had in [place],   inj helped x 6-12 month of pain. ~ 8 -9 yrs ago.   Dr Harrington Challenger, [place]. Rheumatologist.   Moved back here 3 yrs ago.  ----  Left side.   Aleve and Tylenol not helping.   Tramadol- in past, Got Rx but never took and threw away as not up to date.  Hot packs did not help  ------  Worse Lumbar   TENS did not work.  Lidocaine patch. Did not help.   Trigger point inj did not help, and spinal inj.   ---  Saw Doug here who was helpful with another type of back pain.    PROBL:  Patient Active Problem List    Diagnosis Date Noted    Essential hypertension 06/11/2016     Priority: High    Osteoporosis 10/15/2013     Priority: High     Fosamax started 2015?      Low back pain 10/15/2013     Priority: High    Benign neoplasm of colon 11/03/2013     Priority: Medium     Removed via colonscopy 2012; told to repeat 2017       AAA (abdominal aortic aneurysm) 10/15/2013     Priority: Medium     Due for repeat U/S 09/2016 ; Noted 06/02/13: 3.8X3.5 cm partially thrombosed; 03/2016 stable at 4.77 cm- continued surveillance with vascular surgery                Hyperlipidemia 10/15/2013     Priority: Medium    Wears dentures  06/11/2016     Priority: Low    Patient has healthcare proxy 06/11/2016     Priority: Low     Carola Rhine (son), Wallene Huh (sister)       Gallstones 10/23/2013     Priority: Low     Noted U/S: 06/02/13: There is gallbladder sludge/crushed gallstones. No gallbladder wall thickening, no sonographic Murphy's sign. Common bile duct 71mm.       GERD (gastroesophageal reflux disease) 10/15/2013     Priority: Low    AAA (abdominal aortic aneurysm) without rupture - s/p EVAR & Right CFA endarterectomy 11/14/16 10/26/2016     CURRENTMEDS  Current Outpatient Prescriptions   Medication Sig    acetaminophen (TYLENOL) 500 mg tablet Take 1 tablet (500 mg total) by mouth every 6 hours as needed for Pain    ondansetron (ZOFRAN) 4 MG tablet Take 1 tablet (4 mg total) by mouth every 6 hours as needed (nausea)    losartan (COZAAR) 50 MG tablet TAKE 1 TABLET BY MOUTH EVERY DAY    Calcium Carbonate-Vitamin D (CALCIUM 500 +  D PO) Take 1 tablet by mouth 2 times daily    polyethylene glycol (GLYCOLAX,MIRALAX) powder packet Take 17 g by mouth daily as needed    alendronate (FOSAMAX) 70 MG tablet TAKE 1 TABLET BY MOUTH ONCE EVERY 7 DAYS TAKE WITH A FULL GLASS OF WATER DO NOT EAT OR LIE DOWN FOR 30 MINUTES    simvastatin (ZOCOR) 40 MG tablet Take 1 tablet (40 mg total) by mouth daily (with dinner)    aspirin 81 MG tablet Take 1 tablet (81 mg total) by mouth daily    traMADol (ULTRAM) 50 MG tablet Take 1 tablet (50 mg total) by mouth every 6 hours as needed for Pain   Max daily dose: 200 mg       Allergies   Allergen Reactions    Oxycodone Nausea And Vomiting     When in hospital for AAA repair    Lipitor [Atorvastatin] Other (See Comments)     Elevated LFTs     SURGICALHX  Past Surgical History:   Procedure Laterality Date    COLONOSCOPY  2011    HYSTERECTOMY      left ovaries in place     TUBAL LIGATION       SOC  Social History     Social History    Marital status: Divorced     Spouse name: N/A    Number of children:  N/A    Years of education: N/A     Occupational History    Not on file.     Social History Main Topics    Smoking status: Former Smoker     Packs/day: 1.00     Years: 30.00     Types: Cigarettes     Quit date: 06/15/2013    Smokeless tobacco: Never Used    Alcohol use 5.4 oz/week     9 Glasses of wine per week    Drug use: No    Sexual activity: Not Currently     Social History Narrative    Lives alone currently in townhome     2 brothers and 2 sisters that live here    2 boys ([place], [place])    Grandchildren        Family History: see Family Comments in the SnapShot Panel.     Review of Systems   General: No fever or chills, no significant weight loss.   HEENT: No headaches, change in vision, throat pain, hoarseness.  Pulm: No SOB/Wheezing.  Cardiac: No chest pain, palpitations, syncope or pre-syncope, or lower extremity edema.   GI: no abdominal pain, diarrhea, constipation, melena, or blood per rectum.  GU: no dysuria, polyuria, or hematuria.         Physical Exam     BP 128/86    Pulse 68    Ht 1.626 m (5' 4.02")    Wt 65.7 kg (144 lb 12.8 oz)    BMI 24.84 kg/m     GEN: Alert, pleasant well adult in NAD.   HEENT: Normocephalic and atraumatic. PERRL. EOMI. TMs WNL. Moist mucous membranes. No pharyngeal erythema or exudate.   NECK: Supple, no lymphadenopathy or thyromegaly or JVD.   PULM: Clear to auscultation bilaterally.   CVS: RRR, no murmur, normal S1& S2.   ABD: Soft. Normal bowel sounds. No hepatosplenomegaly, masses, tenderness.  No rebound tenderness.    EXTREMITIES: No cyanosis, clubbing or edema.  SKIN: No rashes or concerning lesions.      ASSESSMENT/PLAN- For this encounter:  (  See also Comprehensive Assessment/Plan below)    1. Essential hypertension     2. AAA (abdominal aortic aneurysm) without rupture - s/p EVAR & Right CFA endarterectomy 11/14/16     3. Low back pain  traMADol (ULTRAM) 50 MG tablet   4. Meralgia paraesthetica, right     5. Claustrophobia       R upper lumbar pain:  Try tramadol if not gradually improving, could call and referral to pain clinic.  She would like to repeat the injections that worked so well in [place] 8-9 years ago by her rheumatologist Dr Harrington Challenger, but Dr Karle Starch apparently wanted to do an MRI first and she has severe claustrophobia even with open MRI.     Comprehensive Assessment/Plan   R upper lumbar pain: Try tramadol if not gradually improving, could call and referral to pain clinic.  She would like to repeat the injections that worked so well in [place] 8-9 years ago by her rheumatologist Dr Harrington Challenger, but Dr Karle Starch apparently wanted to do an MRI first and she has severe claustrophobia even with open MRI.   R meralgia paresthetica- Started with procedure 11/14/16, presumably due to positioning.  Neuropathic pain- Gaba 300 mg trial 2-2---> even 2-2-2  Toe pain- XRay.   AAA- Vascular surgery-Kathleen Raman.       EVAR & Right CFA endarterectomy 11/14/16   Osteoporosis- Dexa showed T score -3.3 12/10/13, ordered by Dr Ruthy Dick.       Fosamax 70 mg weekly. To take 5 yrs and the consider stopping for a holiday.       Ca Vit D 500/200 BID      Check Vit D. 79 (09/27/16).   Could consider osteoporosis eval, Durwin Reges, M.D.et al  Hyperlipidemia- simvastatin 20mg  daily. Past 40. Side effects with Atrorva and stopped. Increase back to 40.   Recheck FLP before next visit.   GERD- Past omeprazole 20 mg BID. Off. No further heartburn.  Belching- Gavascon PRN. helps.   Low Back pain- R sided sciatica.  MRI- 11/20/00 [facility]. L5-S1 right foraminal and far lateral disc protrusion/herniation that touches the existing right L5 nerve root. There is moderate right foraminal and mild left foraminal stenosis.  01/26/15: Increase gaba 300 mg,  to1 in the AM, 2 at bedtime. 300mg  bid .   LS Spine XR 12/22/15- disc space narrowing at all levels.   11/29/16: Try tramadol if not gradually improving, could call and referral to pain clinic.  She would like to repeat the  injections that worked so well in [place] 8-9 years ago by her rheumatologist Dr Harrington Challenger, but Dr Karle Starch apparently wanted to do an MRI first and she has severe claustrophobia even with open MRI.    Gallstones- asymptomatic.   Benign neoplasm of colon- repeat colonoscopy due 2017.   Constipation- improved with magnesium supplement.   Aspirin 81 mg daily   Health Maintenance-   --Lipids: LDL 137 04/15/14.   --CRC Screening: 2012. Repeat due 2017.[place] Dr [place](sp?) [place].     Female:   Pap:   Mammo: 12/10/13. 08/27/16    Immunizations:  --Tdap: 06/11/2016  --Flu: 11/04/13, 2016 [facility] [place]. 11/05/2016  --Shingles:      Shingrix- discussed 11/29/2016. Can't afford, would cost $167.   --Prevnar: 10/15/2013  --Pneumovax: 12/10/2014  [facility]. (changing). Preferred [place]     Consultants:   Vascular surgery- 08/27/14 Cathren Harsh.        Repair 11/14/16. FU due 12/14/16.  Ortho\Spinal  Ortho- Dr Karle Starch.     Return in about 3 months (around 02/28/2017) for BP, Back pain, meralgia paresthetica.     Patient Instructions   A new Shingles vaccine has become available January 2018, called Shingrix.     It is a lot more effective than the previous shingles vaccine (Zostavax),   and is on the order of 95-96% effective at preventing shingles.   It is a 2 vaccine series separated by 2-6 months.  If you have Medicare part D this should be given at a pharmacy otherwise could be given here.   ---  Due to short supply we don't have the vaccine.   We hope to early 2019.  It would be a good idea to get it at a pharmacy, or else check with [place] next visit to see if we have it.       Pilar Plate, MD

## 2016-11-29 NOTE — Patient Instructions (Signed)
A new Shingles vaccine has become available January 2018, called Shingrix.     It is a lot more effective than the previous shingles vaccine (Zostavax),   and is on the order of 95-96% effective at preventing shingles.   It is a 2 vaccine series separated by 2-6 months.  If you have Medicare part D this should be given at a pharmacy otherwise could be given here.   ---  Due to short supply we don't have the vaccine.   We hope to early 2019.  It would be a good idea to get it at a pharmacy, or else check with us next visit to see if we have it.

## 2016-12-07 ENCOUNTER — Encounter: Payer: Self-pay | Admitting: Gastroenterology

## 2016-12-11 ENCOUNTER — Other Ambulatory Visit: Payer: Self-pay | Admitting: Vascular Surgery

## 2016-12-11 DIAGNOSIS — I739 Peripheral vascular disease, unspecified: Secondary | ICD-10-CM

## 2016-12-11 DIAGNOSIS — I714 Abdominal aortic aneurysm, without rupture, unspecified: Secondary | ICD-10-CM

## 2016-12-14 ENCOUNTER — Ambulatory Visit
Admission: RE | Admit: 2016-12-14 | Discharge: 2016-12-14 | Disposition: A | Discharge status: Home / Self Care | Payer: Medicare (Managed Care) | Source: Ambulatory Visit

## 2016-12-14 ENCOUNTER — Ambulatory Visit: Payer: Medicare (Managed Care) | Attending: Vascular Surgery | Admitting: Vascular Surgery

## 2016-12-14 ENCOUNTER — Ambulatory Visit
Admission: RE | Admit: 2016-12-14 | Discharge: 2016-12-14 | Disposition: A | Discharge status: Home / Self Care | Payer: Medicare (Managed Care) | Source: Ambulatory Visit | Attending: Vascular Surgery | Admitting: Vascular Surgery

## 2016-12-14 ENCOUNTER — Other Ambulatory Visit: Payer: Medicare (Managed Care)

## 2016-12-14 ENCOUNTER — Encounter: Payer: Self-pay | Admitting: Adult Health

## 2016-12-14 VITALS — BP 130/80 | Ht 62.0 in | Wt 144.0 lb

## 2016-12-14 DIAGNOSIS — Z8679 Personal history of other diseases of the circulatory system: Secondary | ICD-10-CM

## 2016-12-14 DIAGNOSIS — I7389 Other specified peripheral vascular diseases: Secondary | ICD-10-CM

## 2016-12-14 DIAGNOSIS — Z9889 Other specified postprocedural states: Secondary | ICD-10-CM

## 2016-12-14 DIAGNOSIS — T82898A Other specified complication of vascular prosthetic devices, implants and grafts, initial encounter: Secondary | ICD-10-CM

## 2016-12-14 DIAGNOSIS — I739 Peripheral vascular disease, unspecified: Secondary | ICD-10-CM

## 2016-12-14 DIAGNOSIS — I714 Abdominal aortic aneurysm, without rupture, unspecified: Secondary | ICD-10-CM

## 2016-12-14 MED ORDER — IOHEXOL 350 MG/ML (OMNIPAQUE) IV SOLN *I*
1.0000 mL | Freq: Once | INTRAVENOUS | Status: AC
Start: 2016-12-14 — End: 2016-12-14
  Administered 2016-12-14: 100 mL via INTRAVENOUS

## 2016-12-16 LAB — CV ANKLE BRACHIAL INDEX
Left Brachial Pressure: 194 mmHg
Left Dorsalis Pedis Index: 0.97 ratio
Left Dorsalis Pedis Pressure: 191 mmHg
Left Posterior Tibial Index: 0.97 ratio
Left Posterior Tibial Pressure: 191 mmHg
Right Brachial Pressure: 196 mmHg
Right Dorsalis Pedis Index: 1.05 ratio
Right Dorsalis Pedis Pressure: 205 mmHg
Right Posterior Tibial Index: 1.06 ratio
Right Posterior Tibial Pressure: 207 mmHg

## 2016-12-16 LAB — CV DOPPLER AORTA COMPLETE
Aorta Diameter A-P Distal: 5.17 cm
Aorta Diameter A-P Mid: 2.22 cm
Aorta Diameter A-P Prox: 1.98 cm
Aorta Diameter Trans Distal: 5.18 cm
Aorta Diameter Trans Mid: 1.98 cm
Aorta Diameter Trans Prox: 2.11 cm
Aorta EDV Distal: 0 cm/s
Aorta EDV Mid: 10.59 cm/s
Aorta EDV Prox: 10.59 cm/s
Aorta PSV Distal: 30 cm/s
Aorta PSV Mid: 52.19 cm/s
Aorta PSV Prox: 39.9 cm/s
Left Common Iliac Artery Prox AP Diameter: 1.32 cm
Left Common Iliac Artery Prox EDV: 6.58 cm/s
Left Common Iliac Artery Prox PSV: 145.4 cm/s
Left Common Iliac Artery Prox Trans Diameter: 1.26 cm
Right Common Iliac Artery Prox AP Diameter: 1.37 cm
Right Common Iliac Artery Prox EDV: 9.35 cm/s
Right Common Iliac Artery Prox PSV: 100.37 cm/s
Right Common Iliac Artery Prox Trans Diameter: 1.37 cm

## 2016-12-17 NOTE — Progress Notes (Signed)
[facility] Follow-Up Visit    HPI:     [name] is a 76 y.o. female s/p EVAR 11/14/16 with R femoral endarterectomy and bovine pericardial patch angioplasty.  Her PMH significant for HLD, chronic back pain, Sciatica, herniated discs, GERD. Smoking history: former smoker, quit in 2015. The patient notes a history of chronic back pain and left shoulder pain which causes a great deal of chronic pain problems and seems to be improved postoperatively.  Denies claudication, rest pain, tissue loss.  Accompanied by her sister.    Vascular risk factors:   Age: 76 y.o.  Female gender: no  Diabetes mellitus: no  Obesity: no, Body mass index is 26.34 kg/m.  Hypertension: yes  Hyperlipidemia: yes  Tobacco abuse: yes, quit  Family history: no  VQI Info:  Occupation: Retired  Ambulatory status: Ambulates  Current living status: Home Comorbidities:  Coronary artery disease: no  Renal disease: no  Lung disease: no,   Arrhythmia: no  Prior CVA: no  CHF: no  Prior PCI: no  Prior open heart surgery: no  Liver disease: no  Coagulopathy: no     REVIEW OF SYSTEMS  ROS    MEDICAL HISTORY  Past Medical History:   Diagnosis Date    AAA (abdominal aortic aneurysm)     Elevated LFTs     Unclear if this was related to statin, gallstones, alcohol consumption     Essential hypertension 06/11/2016    GERD (gastroesophageal reflux disease)     High blood pressure     HLD (hyperlipidemia)     Hx of colonic polyps     Colonscopy 08/11/10; repeat 2017 per GI     Lumbar disc herniation 2006    L5-S1    Osteoporosis     Spondylosis     Urinary retention        SURGICAL HISTORY  Past Surgical History:   Procedure Laterality Date    COLONOSCOPY  2011    HYSTERECTOMY      left ovaries in place     TUBAL LIGATION         FAMILY HISTORY  Family History   Problem Relation Age of Onset    Dementia Mother 45    Cancer Mother         lung    High cholesterol Mother     Stroke Mother     Parkinsonism Father     Heart  Disease Father 24        passed    High cholesterol Father     Heart attack Father     Cancer Sister         Breast; in recovery    High cholesterol Sister     Hip fracture Sister     Breast cancer Sister 32    Cancer Brother         prostate    High cholesterol Brother     Diabetes Brother     Aneurysm Neg Hx     Anesth problems Neg Hx         SOCIAL HISTORY   reports that she quit smoking about 3 years ago. Her smoking use included Cigarettes. She has a 30.00 pack-year smoking history. She has never used smokeless tobacco. She reports that she drinks about 5.4 oz of alcohol per week . She reports that she does not use drugs.     ALLERGIES  Oxycodone and Lipitor [atorvastatin]     MEDICATIONS  Current Outpatient Prescriptions   Medication Sig    traMADol (ULTRAM) 50 MG tablet Take 1 tablet (50 mg total) by mouth every 6 hours as needed for Pain   Max daily dose: 200 mg    ondansetron (ZOFRAN) 4 MG tablet Take 1 tablet (4 mg total) by mouth every 6 hours as needed (nausea)    losartan (COZAAR) 50 MG tablet TAKE 1 TABLET BY MOUTH EVERY DAY    Calcium Carbonate-Vitamin D (CALCIUM 500 + D PO) Take 1 tablet by mouth 2 times daily    polyethylene glycol (GLYCOLAX,MIRALAX) powder packet Take 17 g by mouth daily as needed    alendronate (FOSAMAX) 70 MG tablet TAKE 1 TABLET BY MOUTH ONCE EVERY 7 DAYS TAKE WITH A FULL GLASS OF WATER DO NOT EAT OR LIE DOWN FOR 30 MINUTES    simvastatin (ZOCOR) 40 MG tablet Take 1 tablet (40 mg total) by mouth daily (with dinner)    aspirin 81 MG tablet Take 1 tablet (81 mg total) by mouth daily     No current facility-administered medications for this visit.         Objective:      Vitals:    12/14/16 1516   BP: 130/80   Weight: 65.3 kg (144 lb)   Height: 1.575 m (5\' 2" )     Imaging:  48 x 52 mm AAA without endoleak, kinking, migration.  R renal A stenosis at orifice with reconstitution of 2 renal arteries distally and slightly  decreased R renal perfusion.  L kidney  significantly larger than right.    ABI  RIGHT lower extremity   ABI within normal limits at 1.06, was 0.99.   Bi- directional Doppler waveform analysis demonstrates normal triphaisc  arterial flow at the common femoral, popliteal, and tibial arteries.    LEFT lower extremity   ABI within normal limits at 0.97, was 1.01.   Bi- directional Doppler waveform analysis demonstrates normal triphasic arterial flow at the common femoral, popliteal, and tibial arteries.    EVAR  Aortic endovascular graft appears patent without evidence of endoleak, and a maximum residual sac of 5.18 cm, was 5.16 cm pre-op.      Physical Exam:      General appearance: healthy, alert, active, no distress and cooperative  HEENT: PERRLA  CV: regular rate and rhythm, S1, S2 normal, no murmur, click, rub or gallop  Lungs:clear  Abd: soft, non-tender. Bowel sounds normal. No masses,  no organomegaly   Extremities: palpable femoral, dopplerable DP/PT signals.  Femoral incisions well-healed  Neuro: normal without focal findings, mental status, speech normal, alert and oriented x3, PERLA and reflexes normal and symmetric    Assessment:   [name] is a 76 y.o. female s/p EVAR, doing well.      Plan:   1. F/u 6 months with repeat US aorta and ABI.  Given R renal perfusion, will hold off CTA abdomen/pelvis.   2. Continue ASA.

## 2017-01-22 ENCOUNTER — Encounter: Payer: Self-pay | Admitting: Family Medicine

## 2017-01-22 ENCOUNTER — Ambulatory Visit: Payer: Medicare (Managed Care) | Attending: Family Medicine | Admitting: Family Medicine

## 2017-01-22 VITALS — BP 170/90 | HR 95 | Temp 97.8°F | Wt 142.0 lb

## 2017-01-22 DIAGNOSIS — J019 Acute sinusitis, unspecified: Secondary | ICD-10-CM

## 2017-01-22 DIAGNOSIS — I10 Essential (primary) hypertension: Secondary | ICD-10-CM

## 2017-01-22 MED ORDER — AMLODIPINE BESYLATE 5 MG PO TABS *I*
5.0000 mg | ORAL_TABLET | Freq: Every day | ORAL | 5 refills | Status: DC
Start: 2017-01-22 — End: 2017-03-13

## 2017-01-22 MED ORDER — AZITHROMYCIN 250 MG PO TABS *I*
ORAL_TABLET | ORAL | 0 refills | Status: DC
Start: 2017-01-22 — End: 2017-02-15

## 2017-01-23 ENCOUNTER — Telehealth: Payer: Self-pay | Admitting: Primary Care

## 2017-01-23 DIAGNOSIS — E7800 Pure hypercholesterolemia, unspecified: Secondary | ICD-10-CM

## 2017-01-23 MED ORDER — SIMVASTATIN 20 MG PO TABS *I*
20.0000 mg | ORAL_TABLET | Freq: Every day | ORAL | 1 refills | Status: DC
Start: 2017-01-23 — End: 2017-07-23

## 2017-01-23 NOTE — Telephone Encounter (Signed)
Pharmacist  stated that there is a drug interaction between amLODIPine (NORVASC) 5 MG tablet and simvastatin (ZOCOR) when dose is higher than 20 MG. Since the patient is taking 40 MG of simvastatin, the pharmacist is wondering if Dr Merrilyn Puma is willing and ok with lowering  it to 20 MG? Please advsie    Thank you,[name]

## 2017-01-23 NOTE — Telephone Encounter (Signed)
Spoke to patient, she was appreciative and understanding.

## 2017-01-23 NOTE — Telephone Encounter (Signed)
Patient called as well regarding the attached concern. Patient wondering if she should stop taking the amlodipine prescription. Please call patient back.

## 2017-01-23 NOTE — Telephone Encounter (Signed)
Lets go ahead and drop the dose to 20 mg daily   Sent a new rx but could cut left over simvastatin in half.

## 2017-01-23 NOTE — Progress Notes (Signed)
Patient ID:  [name]  [birth year]    Subjective:    CC:  Sinus Problem (Pressure behind leeft eye, cheek, HA, bloody nose, sneezing, dizziness, and dry cough ~~SX about a month~~HA off and on X 3 weeks) and Other (cataract surgery in 2 weeks~~told to see PCP to get antibiotic.)    HPI    Patient's current concerned about symptoms detailed in the above nurses note.  She's had intermittent runny nose and postnasal drip.  She's not experienced much cough but she does feel left-sided frontal sinus pressure.  She's used Sudafed consistently for symptom relief and she is complaining of these symptoms contributing to some significant headache,.    She's had difficult few months as last year she had some complications related to an aortic aneurysm surgery.  She's had decreased lower extremity and abdominal muscle strength since surgery which is decreased her ability to be active and exercise.  This is contributed to increasing frustration related to her general health .    [name] has a current medication list which includes the following prescription(s): azithromycin, amlodipine, tramadol, ondansetron, losartan, calcium carbonate-vitamin d, polyethylene glycol, alendronate, simvastatin, and aspirin.  [name] is allergic to oxycodone and lipitor [atorvastatin].      Medications and allergies were reviewed and reconciled with the patient in eRecord today; and Yes changes were made.      Review of Systems   Constitutional: Positive for malaise/fatigue.   HENT: Positive for congestion and sinus pain. Negative for ear pain.    Respiratory: Negative for cough.    Cardiovascular: Negative for chest pain.   Neurological: Positive for weakness (Especially lower extremities and abdominal muscles) and headaches.         Objective:    Vitals:    01/22/17 1539   BP: 170/90   BP Location: Left arm   Patient Position: Sitting   Cuff Size: adult   Pulse: 95   Temp: 36.6 C (97.8 F)   SpO2: 98%   Weight: 64.4 kg (142 lb)     Physical  Exam   Constitutional: She is oriented to person, place, and time and well-developed, well-nourished, and in no distress. No distress.   HENT:   Head: Normocephalic.   Right Ear: External ear normal.   Left Ear: External ear normal.   Mouth/Throat: No oropharyngeal exudate.   TMs mildly injected without bulging there is some small amount of clear fluid behind.  Posterior pharynx is also mildly injected without exudate or discharge.   Eyes: Pupils are equal, round, and reactive to light. Conjunctivae are normal. Right eye exhibits no discharge.   Neck: Normal range of motion. Neck supple.   Cardiovascular: Normal rate, regular rhythm and normal heart sounds.  Exam reveals no friction rub.    No murmur heard.  Pulmonary/Chest: Effort normal and breath sounds normal. No respiratory distress.   Abdominal: Bowel sounds are normal. There is no tenderness. There is no rebound.   Musculoskeletal: She exhibits no edema.   Lymphadenopathy:     She has no cervical adenopathy.   Neurological: She is alert and oriented to person, place, and time. Coordination normal.   Skin: Skin is warm and dry. She is not diaphoretic.   Psychiatric: Mood, affect and judgment normal.       No results found for this or any previous visit (from the past 24 hour(s)).    Assessment/Plan:  [name] was seen today for sinus problem and other.    Diagnoses and all  orders for this visit:    Acute sinusitis  -     azithromycin (ZITHROMAX) 250 MG tablet; Two tabs today then one tab daily    Hypertension, unspecified type  -     amLODIPine (NORVASC) 5 MG tablet; Take 1 tablet (5 mg total) by mouth daily  -     CBC and differential; Future  -     Comprehensive metabolic panel; Future  -     TSH; Future  -     Iron; Future  -     Vitamin B12; Future    Patient's encouraged to discontinue Sudafed as his contributing to some significant hypertension and possibly irregular cardiac rhythm.    Initiated blood pressure medication as detailed above and lab work for  patient to do before her follow-up next week.    This intervention isn't significantly improving her sinus pressure and headaches she's to follow-up ASAP.    I spent over 25 minutes with this patient more than  50% of which was spent in in direct consultation and assessment regarding the above issues:      Return in about 1 week (around 01/29/2017), or if symptoms worsen or fail to improve.    Romualdo Bolk FNP-C

## 2017-01-24 ENCOUNTER — Other Ambulatory Visit
Admission: RE | Admit: 2017-01-24 | Discharge: 2017-01-24 | Disposition: A | Discharge status: Home / Self Care | Payer: Medicare (Managed Care) | Source: Ambulatory Visit | Attending: Family Medicine | Admitting: Family Medicine

## 2017-01-24 DIAGNOSIS — I10 Essential (primary) hypertension: Secondary | ICD-10-CM

## 2017-01-24 LAB — CBC AND DIFFERENTIAL
Baso # K/uL: 0.1 10*3/uL (ref 0.0–0.1)
Basophil %: 1.4 %
Eos # K/uL: 0.1 10*3/uL (ref 0.0–0.4)
Eosinophil %: 1.9 %
Hematocrit: 40 % (ref 34–45)
Hemoglobin: 12.6 g/dL (ref 11.2–15.7)
IMM Granulocytes #: 0 10*3/uL
IMM Granulocytes: 0.2 %
Lymph # K/uL: 1.4 10*3/uL (ref 1.2–3.7)
Lymphocyte %: 22.1 %
MCH: 29 pg/cell (ref 26–32)
MCHC: 32 g/dL (ref 32–36)
MCV: 93 fL (ref 79–95)
Mono # K/uL: 0.7 10*3/uL (ref 0.2–0.9)
Monocyte %: 11.3 %
Neut # K/uL: 4.1 10*3/uL (ref 1.6–6.1)
Nucl RBC # K/uL: 0 10*3/uL (ref 0.0–0.0)
Nucl RBC %: 0 /100 WBC (ref 0.0–0.2)
Platelets: 394 10*3/uL — ABNORMAL HIGH (ref 160–370)
RBC: 4.3 MIL/uL (ref 3.9–5.2)
RDW: 13 % (ref 11.7–14.4)
Seg Neut %: 63.1 %
WBC: 6.5 10*3/uL (ref 4.0–10.0)

## 2017-01-24 LAB — COMPREHENSIVE METABOLIC PANEL
ALT: 11 U/L (ref 0–35)
AST: 19 U/L (ref 0–35)
Albumin: 4.4 g/dL (ref 3.5–5.2)
Alk Phos: 98 U/L (ref 35–105)
Anion Gap: 13 (ref 7–16)
Bilirubin,Total: 0.2 mg/dL (ref 0.0–1.2)
CO2: 25 mmol/L (ref 20–28)
Calcium: 9.5 mg/dL (ref 8.6–10.2)
Chloride: 99 mmol/L (ref 96–108)
Creatinine: 1.07 mg/dL — ABNORMAL HIGH (ref 0.51–0.95)
GFR,Black: 58 * — AB
GFR,Caucasian: 50 * — AB
Glucose: 110 mg/dL — ABNORMAL HIGH (ref 60–99)
Lab: 16 mg/dL (ref 6–20)
Potassium: 4.5 mmol/L (ref 3.3–5.1)
Sodium: 137 mmol/L (ref 133–145)
Total Protein: 6.9 g/dL (ref 6.3–7.7)

## 2017-01-24 LAB — TSH: TSH: 1.67 u[IU]/mL (ref 0.27–4.20)

## 2017-01-24 LAB — IRON: Iron: 44 ug/dL (ref 34–165)

## 2017-01-24 LAB — VITAMIN B12: Vitamin B12: 538 pg/mL (ref 232–1245)

## 2017-01-29 ENCOUNTER — Ambulatory Visit: Payer: Medicare (Managed Care) | Attending: Family Medicine | Admitting: Family Medicine

## 2017-01-29 ENCOUNTER — Encounter: Payer: Self-pay | Admitting: Family Medicine

## 2017-01-29 VITALS — BP 148/98 | HR 48 | Wt 140.0 lb

## 2017-01-29 DIAGNOSIS — M545 Low back pain, unspecified: Secondary | ICD-10-CM

## 2017-01-29 DIAGNOSIS — N189 Chronic kidney disease, unspecified: Secondary | ICD-10-CM

## 2017-01-29 DIAGNOSIS — N179 Acute kidney failure, unspecified: Secondary | ICD-10-CM

## 2017-01-29 DIAGNOSIS — I10 Essential (primary) hypertension: Secondary | ICD-10-CM

## 2017-01-29 NOTE — Progress Notes (Signed)
Patient ID:  ZALEIGH BERMINGHAM  05/23/1940    Subjective:    CC:  Follow-up (BP follow up) and Headache (occasional throbbing HA)    HPI    Dorothe is here fo F/U of B/P she has not been self monitoring.      She is sad about decreased interaction with family locally since moving back to [place].    Complains of some continue L side sinus pressure and headache since treatment with antibiotic.    Deena has a current medication list which includes the following prescription(s): simvastatin, azithromycin, amlodipine, tramadol, ondansetron, losartan, calcium carbonate-vitamin d, polyethylene glycol, alendronate, and aspirin.  Lylian is allergic to oxycodone and lipitor [atorvastatin].      Medications and allergies were reviewed and reconciled with the patient in eRecord today; and No changes were made.      Review of Systems   HENT: Positive for sinus pain.    Cardiovascular: Negative for leg swelling.   Musculoskeletal: Positive for back pain and joint pain.   Neurological: Positive for tremors and headaches. Negative for dizziness.         Objective:    Vitals:    01/29/17 1019   BP: (!) 148/98   BP Location: Left arm   Patient Position: Sitting   Cuff Size: adult   Pulse: (!) 48   SpO2: 99%   Weight: 63.5 kg (140 lb)     Physical Exam   Constitutional: She is oriented to person, place, and time and well-developed, well-nourished, and in no distress.   HENT:   Head: Normocephalic.   Right Ear: External ear normal.   Left Ear: External ear normal.   Mouth/Throat: No oropharyngeal exudate.   Eyes: Pupils are equal, round, and reactive to light. Right eye exhibits no discharge.   Neck: Normal range of motion.   Cardiovascular: Normal rate, regular rhythm and normal heart sounds.  Exam reveals no friction rub.    No murmur heard.  Pulmonary/Chest: Effort normal and breath sounds normal. No respiratory distress.   Abdominal: Bowel sounds are normal. There is no tenderness. There is no rebound.   Musculoskeletal: She exhibits  no edema.   No red flags on exam   Lymphadenopathy:     She has no cervical adenopathy.   Neurological: She is alert and oriented to person, place, and time. Coordination normal.   Skin: Skin is warm and dry. She is not diaphoretic.   Psychiatric: Mood, affect and judgment normal.       No results found for this or any previous visit (from the past 24 hour(s)).    Assessment/Plan:  Eunice was seen today for follow-up and headache.    Diagnoses and all orders for this visit:    Hypertension, unspecified type    Pt. Only been on current reg. X  3 days.  Will continue     Renal failure (ARF), acute on chronic  -     Comprehensive metabolic panel; Future    Low back pain    Pt. Given exercises and hand outs with instructions for how to proceed.  Reviewed these in detail.    She will F/U in 3 weeks.    Return in about 3 weeks (around 02/19/2017), or if symptoms worsen or fail to improve.    Romualdo Bolk FNP-C

## 2017-02-04 ENCOUNTER — Other Ambulatory Visit: Payer: Self-pay | Admitting: Primary Care

## 2017-02-04 DIAGNOSIS — R030 Elevated blood-pressure reading, without diagnosis of hypertension: Secondary | ICD-10-CM

## 2017-02-04 MED ORDER — LOSARTAN POTASSIUM 50 MG PO TABS *I*
50.0000 mg | ORAL_TABLET | Freq: Every day | ORAL | 1 refills | Status: DC
Start: 2017-02-04 — End: 2017-04-19

## 2017-02-05 ENCOUNTER — Encounter: Payer: Self-pay | Admitting: Family Medicine

## 2017-02-07 ENCOUNTER — Encounter: Payer: Self-pay | Admitting: Family Medicine

## 2017-02-07 NOTE — Telephone Encounter (Signed)
Please call patient first thing Friday morning ask her if she can get her blood work done in coming from blood pressure check Friday afternoon

## 2017-02-08 ENCOUNTER — Ambulatory Visit: Payer: Medicare (Managed Care) | Admitting: Family Medicine

## 2017-02-08 ENCOUNTER — Ambulatory Visit: Payer: Medicare (Managed Care) | Attending: Family Medicine | Admitting: Family Medicine

## 2017-02-08 ENCOUNTER — Other Ambulatory Visit
Admission: RE | Admit: 2017-02-08 | Discharge: 2017-02-08 | Disposition: A | Discharge status: Home / Self Care | Payer: Medicare (Managed Care) | Source: Ambulatory Visit | Attending: Family Medicine | Admitting: Family Medicine

## 2017-02-08 ENCOUNTER — Encounter: Payer: Self-pay | Admitting: Family Medicine

## 2017-02-08 VITALS — BP 160/92 | HR 76 | Ht 62.21 in | Wt 140.0 lb

## 2017-02-08 DIAGNOSIS — N189 Chronic kidney disease, unspecified: Secondary | ICD-10-CM

## 2017-02-08 DIAGNOSIS — N179 Acute kidney failure, unspecified: Secondary | ICD-10-CM | POA: Insufficient documentation

## 2017-02-08 DIAGNOSIS — R51 Headache: Secondary | ICD-10-CM

## 2017-02-08 DIAGNOSIS — I10 Essential (primary) hypertension: Secondary | ICD-10-CM | POA: Insufficient documentation

## 2017-02-08 DIAGNOSIS — R519 Headache, unspecified: Secondary | ICD-10-CM

## 2017-02-08 LAB — COMPREHENSIVE METABOLIC PANEL
ALT: 18 U/L (ref 0–35)
AST: 27 U/L (ref 0–35)
Albumin: 4.8 g/dL (ref 3.5–5.2)
Alk Phos: 101 U/L (ref 35–105)
Anion Gap: 15 (ref 7–16)
Bilirubin,Total: 0.3 mg/dL (ref 0.0–1.2)
CO2: 26 mmol/L (ref 20–28)
Calcium: 10 mg/dL (ref 8.6–10.2)
Chloride: 98 mmol/L (ref 96–108)
Creatinine: 1.01 mg/dL — ABNORMAL HIGH (ref 0.51–0.95)
GFR,Black: 62 *
GFR,Caucasian: 54 * — AB
Glucose: 100 mg/dL — ABNORMAL HIGH (ref 60–99)
Lab: 16 mg/dL (ref 6–20)
Potassium: 4.3 mmol/L (ref 3.3–5.1)
Sodium: 139 mmol/L (ref 133–145)
Total Protein: 7.6 g/dL (ref 6.3–7.7)

## 2017-02-08 MED ORDER — KETOROLAC TROMETHAMINE 60 MG/2ML IM SOLN *I*
30.0000 mg | Freq: Once | INTRAMUSCULAR | Status: AC
Start: 2017-02-08 — End: 2017-02-08
  Administered 2017-02-08: 30 mg via INTRAMUSCULAR

## 2017-02-08 MED ORDER — ATENOLOL-CHLORTHALIDONE 50-25 MG PO TABS *A*
0.5000 | ORAL_TABLET | Freq: Every evening | ORAL | 0 refills | Status: DC
Start: 2017-02-08 — End: 2017-03-13

## 2017-02-08 NOTE — Telephone Encounter (Signed)
Patient returned call and scheduled appointment with [facility] for this afternoon.

## 2017-02-08 NOTE — Telephone Encounter (Signed)
Duplicate encounter opened. 

## 2017-02-08 NOTE — Progress Notes (Signed)
Patient ID:  [name]  03/10/1940    Subjective:    CC:  Hypertension and Lab Results    HPI    Headache:  Constant headache that radiates from front of head to back of head.  Rated as 8/10 states she is feeling terrible.  She states the back of her neck is throbbing, it has kept her awake at night.  She has not taken tylenol or motrin for his.  States she can feel her heart beat pounding in her throat.    **Has eye surgery next week.        [name] has a current medication list which includes the following prescription(s): losartan, simvastatin, azithromycin, amlodipine, tramadol, ondansetron, calcium carbonate-vitamin d, polyethylene glycol, alendronate, aspirin, and atenolol-chlorthalidone, and the following Facility-Administered Medications: ketorolac.  [name] is allergic to oxycodone and lipitor [atorvastatin].      Medications and allergies were reviewed and reconciled with the patient in eRecord today; and No changes were made.      Review of Systems   Constitutional: Positive for malaise/fatigue. Negative for chills, diaphoresis, fever and weight loss.   HENT: Positive for nosebleeds and sinus pain. Negative for ear pain and sore throat.    Respiratory: Negative.    Cardiovascular: Positive for palpitations. Negative for chest pain and leg swelling.   Gastrointestinal: Positive for constipation. Negative for abdominal pain, heartburn, nausea and vomiting.   Genitourinary: Negative.    Musculoskeletal: Positive for neck pain.   Neurological: Positive for headaches. Negative for weakness.         Objective:    Vitals:    02/08/17 1327   BP: (!) 160/92   Pulse: 76   Weight: 63.5 kg (140 lb)   Height: 1.58 m (5' 2.21")     Physical Exam   Constitutional: She is oriented to person, place, and time and well-developed, well-nourished, and in no distress.   HENT:   Head: Normocephalic.   Right Ear: External ear and ear canal normal. Tympanic membrane is scarred and perforated. Tympanic membrane is not  erythematous, not retracted and not bulging. Decreased hearing is noted.   Left Ear: Hearing and ear canal normal. Tympanic membrane is scarred, erythematous and retracted. Tympanic membrane is not perforated and not bulging.   Nose: Nose normal.   Mouth/Throat: Oropharynx is clear and moist and mucous membranes are normal.   Left TM cloudy   Cardiovascular: Normal rate, regular rhythm, S1 normal, S2 normal and normal heart sounds.    No murmur heard.  Pulses:       Carotid pulses are 2+ on the right side, and 2+ on the left side.  Pulmonary/Chest: Effort normal and breath sounds normal.   Abdominal: Soft. Normal appearance and bowel sounds are normal. There is no splenomegaly or hepatomegaly. There is no tenderness.   Musculoskeletal:   Tender trapezius   Neurological: She is oriented to person, place, and time.       Recent Results (from the past 24 hour(s))   Comprehensive metabolic panel    Collection Time: 02/08/17  8:14 AM   Result Value Ref Range    Sodium 139 133 - 145 mmol/L    Potassium 4.3 3.3 - 5.1 mmol/L    Chloride 98 96 - 108 mmol/L    CO2 26 20 - 28 mmol/L    Anion Gap 15 7 - 16    UN 16 6 - 20 mg/dL    Creatinine 1.01 (H) 0.51 - 0.95 mg/dL  GFR,Caucasian 54 (!) *    GFR,Black 62 *    Glucose 100 (H) 60 - 99 mg/dL    Calcium 10.0 8.6 - 10.2 mg/dL    Total Protein 7.6 6.3 - 7.7 g/dL    Albumin 4.8 3.5 - 5.2 g/dL    Bilirubin,Total 0.3 0.0 - 1.2 mg/dL    AST 27 0 - 35 U/L    ALT 18 0 - 35 U/L    Alk Phos 101 35 - 105 U/L       Assessment/Plan:  [name] was seen today for hypertension and lab results.    Diagnoses and all orders for this visit:    Headache:  -Likely related to high blood pressure  -ketorolac 68m IM today in office    Essential hypertension  -Labwork reviewed today  -Medication reviewed with patient; takes it daily   -Addition of 1/2 tablet tenoretic in the evening  -Continue other BP meds in AM  -Taking BP readings at home; will bring data with her to next appointment      Return in  about 1 week (around 02/15/2017) for Follow up; next Friday or sooner if headaches do not resolve.    DRomualdo BolkFNP-C

## 2017-02-08 NOTE — Patient Instructions (Addendum)
High Blood Pressure  Today we are adding tenoretic 1/2 tablet in the evening  Take this medication in the evening; take the rest of your blood pressure medication in the morning.  -continue taking readings at home; bring with you to your next appointment    Headache:  Toradol 30mg  today in office  Can continue to take tylenol today for headache

## 2017-02-15 ENCOUNTER — Encounter: Payer: Self-pay | Admitting: Family Medicine

## 2017-02-15 ENCOUNTER — Ambulatory Visit: Payer: Medicare (Managed Care) | Attending: Family Medicine | Admitting: Family Medicine

## 2017-02-15 VITALS — BP 128/74 | HR 53 | Wt 139.0 lb

## 2017-02-15 DIAGNOSIS — I10 Essential (primary) hypertension: Secondary | ICD-10-CM

## 2017-02-15 DIAGNOSIS — I714 Abdominal aortic aneurysm, without rupture, unspecified: Secondary | ICD-10-CM

## 2017-02-15 DIAGNOSIS — J329 Chronic sinusitis, unspecified: Secondary | ICD-10-CM

## 2017-02-15 NOTE — Patient Instructions (Addendum)
Sinus Pain:  Try loratidine 10mg  (generic Claritin)  This is an antihistamine that will not make you drowsy  This will help prevent sinus inflammation and swelling to keep congestion from building up.    Increase exercise; walking around the rec center  Stationary exercise machines like the elliptical or bike; for as long as you can even if its only several minutes at a time  -Incorporate stretching exercises including flexion  If you are having trouble with leg strengthening exercises come back and see me [name]).

## 2017-02-15 NOTE — Progress Notes (Signed)
Patient ID:  [name]  May 15, 1940    Subjective:    CC:  Follow-up (BP follow up) and Other (Toradol resolved HA and congestion)    HPI     Reports headache has resolved since appointment last Friday, after toradol injection.  She has not had sinus pain, congestion, nasal drainage.  She occasionally has "twinges" of pain in the left side of her head.    She brought blood pressure readings from home..  Systolics range from 867-619 with diastolics 50-93.      [name] has a current medication list which includes the following prescription(s): chlorhexidine, atenolol-chlorthalidone, losartan, simvastatin, amlodipine, tramadol, ondansetron, calcium carbonate-vitamin d, polyethylene glycol, alendronate, and aspirin.  [name] is allergic to oxycodone and lipitor [atorvastatin].      Medications and allergies were reviewed and reconciled with the patient in eRecord today; and No changes were made.      Review of Systems   Constitutional: Negative.    HENT: Negative for congestion, nosebleeds, sinus pain and sore throat.    Respiratory: Negative.    Cardiovascular: Negative.    Gastrointestinal: Negative for abdominal pain, blood in stool, heartburn, nausea and vomiting.   Genitourinary: Negative.    Skin: Negative.    Neurological: Negative for headaches.         Objective:    Vitals:    02/15/17 1004 02/15/17 1005   BP: 130/80 128/74   BP Location: Left arm Right arm   Patient Position: Sitting Sitting   Cuff Size: adult adult   Pulse: 53    SpO2: 97%    Weight: 63 kg (139 lb)      Physical Exam   Constitutional: She is oriented to person, place, and time and well-developed, well-nourished, and in no distress.   Eyes: Pupils are equal, round, and reactive to light.   Neck: Normal range of motion.   Cardiovascular: Normal rate, regular rhythm and normal heart sounds.    Pulmonary/Chest: Effort normal and breath sounds normal.   Musculoskeletal: Normal range of motion.   Neurological: She is alert and oriented to person,  place, and time.   Skin: Skin is warm and dry.   Psychiatric: Mood, affect and judgment normal.       No results found for this or any previous visit (from the past 24 hour(s)).    Assessment/Plan:  [name] was seen today for follow-up and other.    Diagnoses and all orders for this visit:     Essential hypertension  -Blood pressure continuing to trend towards BP goals of 120/80 after one week of initiation atenolol-chlorthalidone 0.5 tablet daily and may continue   -Discussed increasing exercise regimen, including flexibility exercises and weight loss to continue to decrease blood pressure  -AKI from AAA repair has been resolving, unlikely to be contributing to HTN    Abdominal aortic aneurysm (AAA) without rupture  -Continued on 81mg  aspirin   -No bruising, hematuria, or blood in stool after Toradol injection    Chronic sinusitis, unspecified   -Recommending antihistamine like 10mg  loratadine daily to decrease sinus inflammation      Return in about 1 month (around 03/15/2017).    Romualdo Bolk FNP-C

## 2017-02-19 ENCOUNTER — Ambulatory Visit: Payer: Medicare (Managed Care) | Admitting: Family Medicine

## 2017-02-28 ENCOUNTER — Ambulatory Visit: Payer: Medicare (Managed Care) | Admitting: Primary Care

## 2017-03-08 ENCOUNTER — Ambulatory Visit: Payer: Medicare (Managed Care) | Attending: Family Medicine | Admitting: Family Medicine

## 2017-03-08 ENCOUNTER — Encounter: Payer: Self-pay | Admitting: Family Medicine

## 2017-03-08 VITALS — BP 136/74 | HR 58 | Ht 62.21 in | Wt 142.9 lb

## 2017-03-08 DIAGNOSIS — I10 Essential (primary) hypertension: Secondary | ICD-10-CM

## 2017-03-08 NOTE — Progress Notes (Signed)
Patient ID:  [name]  Jun 25, 1940    Subjective:    CC:  Follow-up (1 month f/u HTN)    HPI    Patient self monitored her blood pressures over the last few weeks values are primarily in the 130/70 range with heart rates ranging from the low 50s to 70.  These values are consistent with the value obtained here in the office today .    She states she's currently not experiencing any further problems with bad headache.  She does occasionally get a twinge in her right frontal area that's brief and sharp but no further headaches.  She's not having any exertional shortness of breath.    She does have scheduled for cataract surgery in the next few weeks.  She has follow-up with vascular surgery in June.    [name] has a current medication list which includes the following prescription(s): loratadine, chlorhexidine, atenolol-chlorthalidone, losartan, simvastatin, amlodipine, tramadol, ondansetron, calcium carbonate-vitamin d, polyethylene glycol, alendronate, and aspirin.  [name] is allergic to oxycodone and lipitor [atorvastatin].      Medications and allergies were reviewed and reconciled with the patient in eRecord today; and No changes were made.      Review of Systems   Constitutional: Negative for diaphoresis.   Respiratory: Negative for cough, shortness of breath and wheezing.    Cardiovascular: Negative for chest pain, palpitations, orthopnea and leg swelling.   Neurological: Negative for dizziness, sensory change and headaches.         Objective:    Vitals:    03/08/17 0946   BP: 136/74   Pulse: 58   Weight: 64.8 kg (142 lb 14.4 oz)   Height: 1.58 m (5' 2.21")     Physical Exam   Constitutional: She is oriented to person, place, and time and well-developed, well-nourished, and in no distress.   Eyes: Pupils are equal, round, and reactive to light.   Neck: Normal range of motion.   Cardiovascular: Normal rate and regular rhythm.    Pulmonary/Chest: Effort normal.   Musculoskeletal: Normal range of motion.    Neurological: She is alert and oriented to person, place, and time.   Skin: Skin is warm and dry.   Psychiatric: Mood, affect and judgment normal.       No results found for this or any previous visit (from the past 24 hour(s)).    Assessment/Plan:  [name] was seen today for follow-up.    Diagnoses and all orders for this visit:    Hypertension, unspecified type  -     Comprehensive metabolic panel; Future  -     TSH; Future  -     Iron; Future  -     CBC and differential; Future  -     Lipid add Rfx to Drt LDL if Trig >400; Future    she'll continue her current blood pressure regimen keep monitoring blood pressure couple times a week and follow up with lab work as detailed above in 3 months.    She'll return sooner if headaches return.    Return in about 3 months (around 06/08/2017), or if symptoms worsen or fail to improve.    Romualdo Bolk FNP-C

## 2017-03-13 ENCOUNTER — Other Ambulatory Visit: Payer: Self-pay | Admitting: Family Medicine

## 2017-03-13 DIAGNOSIS — I10 Essential (primary) hypertension: Secondary | ICD-10-CM

## 2017-03-14 MED ORDER — AMLODIPINE BESYLATE 5 MG PO TABS *I*
5.0000 mg | ORAL_TABLET | Freq: Every day | ORAL | 5 refills | Status: DC
Start: 2017-03-14 — End: 2017-04-19

## 2017-03-28 ENCOUNTER — Telehealth: Payer: Self-pay

## 2017-03-28 NOTE — Telephone Encounter (Signed)
Colletta Maryland from [facility] calling to confirm a DX patient wrote on her preadmission form for cataract surgery.   Colletta Maryland unable to get in touch with patient, so she called PCP office.     Confirmed that patient has a stent.  Surgery done 11/2016

## 2017-04-08 ENCOUNTER — Other Ambulatory Visit: Payer: Self-pay | Admitting: Primary Care

## 2017-04-08 DIAGNOSIS — M810 Age-related osteoporosis without current pathological fracture: Secondary | ICD-10-CM

## 2017-04-08 MED ORDER — ALENDRONATE SODIUM 70 MG PO TABS *I*
70.0000 mg | ORAL_TABLET | ORAL | 1 refills | Status: DC
Start: 2017-04-08 — End: 2017-09-27

## 2017-04-19 ENCOUNTER — Other Ambulatory Visit: Payer: Self-pay | Admitting: Primary Care

## 2017-04-19 DIAGNOSIS — R030 Elevated blood-pressure reading, without diagnosis of hypertension: Secondary | ICD-10-CM

## 2017-04-19 DIAGNOSIS — I10 Essential (primary) hypertension: Secondary | ICD-10-CM

## 2017-04-19 MED ORDER — LOSARTAN POTASSIUM 50 MG PO TABS *I*
50.0000 mg | ORAL_TABLET | Freq: Every day | ORAL | 1 refills | Status: DC
Start: 2017-04-19 — End: 2018-03-27

## 2017-04-19 MED ORDER — AMLODIPINE BESYLATE 5 MG PO TABS *I*
5.0000 mg | ORAL_TABLET | Freq: Every day | ORAL | 5 refills | Status: DC
Start: 2017-04-19 — End: 2017-08-09

## 2017-04-19 NOTE — Telephone Encounter (Signed)
lov 03/08/17

## 2017-06-07 ENCOUNTER — Other Ambulatory Visit: Payer: Medicare (Managed Care)

## 2017-06-13 ENCOUNTER — Encounter: Payer: Medicare (Managed Care) | Admitting: Family Medicine

## 2017-06-14 ENCOUNTER — Ambulatory Visit
Admission: RE | Admit: 2017-06-14 | Discharge: 2017-06-14 | Disposition: A | Discharge status: Home / Self Care | Payer: Medicare (Managed Care) | Source: Ambulatory Visit | Attending: Vascular Surgery | Admitting: Vascular Surgery

## 2017-06-14 ENCOUNTER — Ambulatory Visit
Admission: RE | Admit: 2017-06-14 | Discharge: 2017-06-14 | Disposition: A | Discharge status: Home / Self Care | Payer: Medicare (Managed Care) | Source: Ambulatory Visit

## 2017-06-14 ENCOUNTER — Encounter: Payer: Self-pay | Admitting: Vascular Surgery

## 2017-06-14 ENCOUNTER — Ambulatory Visit: Payer: Medicare (Managed Care) | Admitting: Vascular Surgery

## 2017-06-14 VITALS — BP 108/60 | Ht 62.21 in | Wt 142.0 lb

## 2017-06-14 DIAGNOSIS — I7389 Other specified peripheral vascular diseases: Secondary | ICD-10-CM

## 2017-06-14 DIAGNOSIS — Z8679 Personal history of other diseases of the circulatory system: Secondary | ICD-10-CM

## 2017-06-14 DIAGNOSIS — Z9889 Other specified postprocedural states: Secondary | ICD-10-CM

## 2017-06-14 DIAGNOSIS — T82898D Other specified complication of vascular prosthetic devices, implants and grafts, subsequent encounter: Secondary | ICD-10-CM | POA: Insufficient documentation

## 2017-06-14 NOTE — Patient Instructions (Signed)
Continue current medications  Call 911 with any sudden, severe abdominal or back pain  Follow up in 6 months with repeat CT angiogram.

## 2017-06-15 NOTE — Progress Notes (Signed)
Vascular Surgery Outpatient Clinic Follow-Up Visit    HPI:     [name] is a 77 y.o. female who was seen in the Vascular surgery outpatient clinic for routine follow up of her endovascular repair of a 5.1 cm abdominal aortic aneurysm (AAA).  [name] underwent an EVAR with a right femoral endarterectomy and bovine pericardial patch angioplasty in November 2018.  Her medical history is significant for HLD, chronic back pain, Sciatica, herniated discs, GERD and smoking history: quit in 2015.   [name] denies any abdominal or back pain of unknown etiology.  She denies and post prandial discomfort, claudication symptoms, ischemic rest pain or tissue loss of her lower extremities.  She does report persistent numbness in right upper medial thigh since her surgery.  We discussed this is probable nerve injury secondary to the right groin incision that may improve with time. [name] denies any chest pain or shortness of breath.     REVIEW OF SYSTEMS  ROS - pertinent items reviewed in HPI    MEDICAL HISTORY  Past Medical History:   Diagnosis Date    AAA (abdominal aortic aneurysm)     Elevated LFTs     Unclear if this was related to statin, gallstones, alcohol consumption     Essential hypertension 06/11/2016    GERD (gastroesophageal reflux disease)     High blood pressure     HLD (hyperlipidemia)     Hx of colonic polyps     Colonscopy 08/11/10; repeat 2017 per GI     Lumbar disc herniation 2006    L5-S1    Osteoporosis     Spondylosis     Urinary retention        SURGICAL HISTORY  Past Surgical History:   Procedure Laterality Date    cataract repair Bilateral     COLONOSCOPY  2011    HYSTERECTOMY      left ovaries in place     TUBAL LIGATION         FAMILY HISTORY  Family History   Problem Relation Age of Onset    Dementia Mother 41    Cancer Mother         lung    High cholesterol Mother     Stroke Mother     Parkinsonism Father     Heart Disease Father 44        passed    High cholesterol Father      Heart attack Father     Cancer Sister         Breast; in recovery    High cholesterol Sister     Hip fracture Sister     Breast cancer Sister 23    Cancer Brother         prostate    High cholesterol Brother     Diabetes Brother     Aneurysm Neg Hx     Anesth problems Neg Hx         SOCIAL HISTORY   reports that she quit smoking about 4 years ago. Her smoking use included Cigarettes. She has a 30.00 pack-year smoking history. She has never used smokeless tobacco. She reports that she drinks about 5.4 oz of alcohol per week . She reports that she does not use drugs.     ALLERGIES  Oxycodone and Lipitor [atorvastatin]     MEDICATIONS  Current Outpatient Prescriptions   Medication Sig    losartan (COZAAR) 50 MG tablet Take 1 tablet (50 mg total)  by mouth daily    amLODIPine (NORVASC) 5 MG tablet Take 1 tablet (5 mg total) by mouth daily    alendronate (FOSAMAX) 70 MG tablet Take 1 tablet (70 mg total) by mouth every 7 days   Take with a full glass of water. Do not eat or lie down for 30 min.    atenolol-chlorthalidone (TENORETIC 50) 50-25 MG per tablet TAKE 1/2 TABLET BY MOUTH EVERY EVENING    loratadine (CLARITIN) 10 MG tablet Take 10 mg by mouth daily    simvastatin (ZOCOR) 20 MG tablet Take 1 tablet (20 mg total) by mouth daily (with dinner)    traMADol (ULTRAM) 50 MG tablet Take 1 tablet (50 mg total) by mouth every 6 hours as needed for Pain   Max daily dose: 200 mg    Calcium Carbonate-Vitamin D (CALCIUM 500 + D PO) Take 1 tablet by mouth 2 times daily    polyethylene glycol (GLYCOLAX,MIRALAX) powder packet Take 17 g by mouth daily as needed    aspirin 81 MG tablet Take 1 tablet (81 mg total) by mouth daily    chlorhexidine (PERIDEX) 0.12 % solution SWISH AND SPIT 1/2 OZ FOR 30 SECONDS TWO TIMES DAILY FOR 2 WEEKS    ondansetron (ZOFRAN) 4 MG tablet Take 1 tablet (4 mg total) by mouth every 6 hours as needed (nausea)     No current facility-administered medications for this visit.          Objective:      Vitals:    06/14/17 1051   BP: 108/60   Weight: 64.4 kg (142 lb)   Height: 1.58 m (5' 2.21")     Imaging:  Aortic endovascular graft appears patent without evidence of leak, and a maximum residual sac of 4.91cm (was 5.18)    RIGHT lower extremity   ABI within normal limits at 1.04 (was1.06)   Bi- directional Doppler waveform analysis demonstrates normal triphasic  arterial flow at the common femoral, popliteal, and tibial arteries.    LEFT lower extremity   ABI within normal limits at 1.04 (was 0.97)   Bi- directional Doppler waveform analysis demonstrates normal triphasic arterial flow at the common femoral, popliteal, and tibial arteries.    Physical Exam:      General appearance: healthy, alert, active, no distress and cooperative  HEENT: sclera clear, anicteric and neck supple, no carotid bruit  CV: regular rate and rhythm  Lungs: clear  Abd: soft, non-tender, bowel sounds normal.   Extremities:  Warm, well perfused; no cyanosis, ischemic changes or edema  Bilateral DP palpable    Neuro: normal without focal findings, mental status, speech normal, alert and oriented x3 and gait and station normal    Assessment:   [name] is a 77 y.o. female with history of AAA; s/p EVAR. Stable exam      Plan:      Results of studies discussed with patient   Recommend continue daily antiplatelet and statin therapy   Instructed to seek immediate medical attention with any sudden severe abdominal or back pain   Follow up in 6 months with repeat CT angiogram of abdomen/pelvis     Artist Pais  NP   Vascular Surgery   06/15/2017 7:32 pm

## 2017-06-17 ENCOUNTER — Other Ambulatory Visit
Admission: RE | Admit: 2017-06-17 | Discharge: 2017-06-17 | Disposition: A | Discharge status: Home / Self Care | Payer: Medicare (Managed Care) | Source: Ambulatory Visit | Attending: Family Medicine | Admitting: Family Medicine

## 2017-06-17 DIAGNOSIS — I10 Essential (primary) hypertension: Secondary | ICD-10-CM | POA: Insufficient documentation

## 2017-06-17 LAB — CBC AND DIFFERENTIAL
Baso # K/uL: 0.1 10*3/uL (ref 0.0–0.1)
Basophil %: 1.4 %
Eos # K/uL: 0.1 10*3/uL (ref 0.0–0.4)
Eosinophil %: 2.9 %
Hematocrit: 41 % (ref 34–45)
Hemoglobin: 13.2 g/dL (ref 11.2–15.7)
IMM Granulocytes #: 0 10*3/uL
IMM Granulocytes: 0.4 %
Lymph # K/uL: 1.3 10*3/uL (ref 1.2–3.7)
Lymphocyte %: 26.2 %
MCH: 30 pg/cell (ref 26–32)
MCHC: 32 g/dL (ref 32–36)
MCV: 94 fL (ref 79–95)
Mono # K/uL: 0.6 10*3/uL (ref 0.2–0.9)
Monocyte %: 12.3 %
Neut # K/uL: 2.8 10*3/uL (ref 1.6–6.1)
Nucl RBC # K/uL: 0 10*3/uL (ref 0.0–0.0)
Nucl RBC %: 0 /100 WBC (ref 0.0–0.2)
Platelets: 245 10*3/uL (ref 160–370)
RBC: 4.4 MIL/uL (ref 3.9–5.2)
RDW: 13.2 % (ref 11.7–14.4)
Seg Neut %: 56.8 %
WBC: 4.9 10*3/uL (ref 4.0–10.0)

## 2017-06-17 LAB — CV ANKLE BRACHIAL INDEX
Left Brachial Pressure: 133 mmHg
Left Dorsalis Pedis Index: 0.98 ratio
Left Dorsalis Pedis Pressure: 134 mmHg
Left Posterior Tibial Index: 1.04 ratio
Left Posterior Tibial Pressure: 142 mmHg
Right Brachial Pressure: 137 mmHg
Right Dorsalis Pedis Index: 1 ratio
Right Dorsalis Pedis Pressure: 137 mmHg
Right Posterior Tibial Index: 1.04 ratio
Right Posterior Tibial Pressure: 143 mmHg

## 2017-06-17 LAB — CV DOPPLER AORTA COMPLETE
Aorta Diameter A-P Distal: 4.41 cm
Aorta Diameter A-P Mid: 2.62 cm
Aorta Diameter A-P Prox: 2.79 cm
Aorta Diameter Trans Distal: 4.91 cm
Aorta Diameter Trans Mid: 2.75 cm
Aorta Diameter Trans Prox: 2.28 cm
Aorta EDV Distal: 0 cm/s
Aorta EDV Mid: 7.36 cm/s
Aorta EDV Prox: 0 cm/s
Aorta PSV Distal: 35.08 cm/s
Aorta PSV Mid: 29.03 cm/s
Aorta PSV Prox: 32.56 cm/s
Left Common Iliac Artery Prox AP Diameter: 1.31 cm
Left Common Iliac Artery Prox EDV: 0 cm/s
Left Common Iliac Artery Prox PSV: 63.36 cm/s
Left Common Iliac Artery Prox Trans Diameter: 1.13 cm
Right Common Iliac Artery Prox AP Diameter: 1.25 cm
Right Common Iliac Artery Prox EDV: 0 cm/s
Right Common Iliac Artery Prox PSV: 48.33 cm/s
Right Common Iliac Artery Prox Trans Diameter: 1.23 cm

## 2017-06-17 LAB — COMPREHENSIVE METABOLIC PANEL
ALT: 15 U/L (ref 0–35)
AST: 23 U/L (ref 0–35)
Albumin: 4.2 g/dL (ref 3.5–5.2)
Alk Phos: 70 U/L (ref 35–105)
Anion Gap: 11 (ref 7–16)
Bilirubin,Total: 0.3 mg/dL (ref 0.0–1.2)
CO2: 29 mmol/L — ABNORMAL HIGH (ref 20–28)
Calcium: 10 mg/dL (ref 8.6–10.2)
Chloride: 99 mmol/L (ref 96–108)
Creatinine: 1.22 mg/dL — ABNORMAL HIGH (ref 0.51–0.95)
GFR,Black: 49 * — AB
GFR,Caucasian: 43 * — AB
Glucose: 94 mg/dL (ref 60–99)
Lab: 23 mg/dL — ABNORMAL HIGH (ref 6–20)
Potassium: 4.6 mmol/L (ref 3.3–5.1)
Sodium: 139 mmol/L (ref 133–145)
Total Protein: 6.8 g/dL (ref 6.3–7.7)

## 2017-06-17 LAB — LIPID PANEL
Chol/HDL Ratio: 3
Cholesterol: 178 mg/dL
HDL: 59 mg/dL
LDL Calculated: 97 mg/dL
Non HDL Cholesterol: 119 mg/dL
Triglycerides: 108 mg/dL

## 2017-06-17 LAB — IRON: Iron: 84 ug/dL (ref 34–165)

## 2017-06-17 LAB — TSH: TSH: 2.69 u[IU]/mL (ref 0.27–4.20)

## 2017-06-18 NOTE — Progress Notes (Signed)
Review results at next visit

## 2017-06-21 ENCOUNTER — Ambulatory Visit: Payer: Medicare (Managed Care) | Attending: Family Medicine | Admitting: Family Medicine

## 2017-06-21 ENCOUNTER — Ambulatory Visit
Admission: RE | Admit: 2017-06-21 | Discharge: 2017-06-21 | Disposition: A | Discharge status: Home / Self Care | Payer: Medicare (Managed Care) | Source: Ambulatory Visit | Attending: Family Medicine | Admitting: Family Medicine

## 2017-06-21 ENCOUNTER — Encounter: Payer: Self-pay | Admitting: Family Medicine

## 2017-06-21 VITALS — BP 124/70 | HR 50 | Ht 63.58 in | Wt 144.0 lb

## 2017-06-21 DIAGNOSIS — M545 Low back pain, unspecified: Secondary | ICD-10-CM

## 2017-06-21 DIAGNOSIS — Z1211 Encounter for screening for malignant neoplasm of colon: Secondary | ICD-10-CM

## 2017-06-21 DIAGNOSIS — Z0000 Encounter for general adult medical examination without abnormal findings: Secondary | ICD-10-CM

## 2017-06-21 DIAGNOSIS — M47816 Spondylosis without myelopathy or radiculopathy, lumbar region: Secondary | ICD-10-CM

## 2017-06-21 NOTE — Patient Instructions (Signed)
Thank you for completing your Subsequent Annual Medicare Visit and Back Pain ("pain is killing me"~~pain 10/10~~more constant )   with [place] today.     The purpose of this visits was to:     Screen for disease   Assess risk of future medical problems   Help develop a healthy lifestyle   Update vaccines   Get to know your doctor in case of an illness    Patient Care Team:  Pilar Plate, MD as PCP - Luane School, Janett Billow, MD  Roselie Awkward, MD as Consulting Provider (Cardiology)     Medicare 5 Year Plan    The following items were identified as areas of concern during your screening today:  High Blood Pressure (Hypertension) - This is a risk factor for Heart Attack, Stroke, Kidney Problems and Eye Problems.   BMI greater than 25 - This is a risk for Heart Attack, Stroke, High Blood Pressure, Diabetes, High Cholesterol and other complications.       The Health Maintenance table below identifies screening tests and immunizations recommended by your health care team:  Health Maintenance   Topic Date Due    HIV TESTING OFFERED  08/29/1953    COLON CANCER SCREENING 10 YEAR COLONOSCOPY  08/30/1974    IMM-ZOSTER (1 of 2) 08/29/1989    Lung Cancer Screening  08/30/1994    Breast Cancer Screening Other  08/27/2017    Fall Risk Screening  02/15/2018    DEPRESSION SCREEN YEARLY  06/22/2018    Bone Density Screening-Every 2 Years  08/28/2018    IMM-INFLUENZA  Completed    IMM-PREVNAR VACCINE 65 + YRS  Completed    IMM-PNEUMOVAX VACCINE 65 + YRS  Completed     In addition, goals and orders placed to address these recommendations are listed in the "Today's Visit" section.    We wish you the best of health and look forward to seeing you again next year for your Annual Medicare Wellness Visit.     If you have any health care concerns before then, please do not hesitate to contact us.

## 2017-06-21 NOTE — Progress Notes (Signed)
Today we reviewed and updated [name] smoking status, activities of daily living, depression screen, fall risk, medications and allergies.   I have counseled the patient in the above areas.     Subjective:     Chief Complaint: [name] is a 77 y.o. female here for a/an Subsequent Annual Medicare Visit and Back Pain ("pain is killing me"~~pain 10/10~~more constant )    In general, [name] rates their overall health as:  good    Patient Care Team:  Pilar Plate, MD as PCP - Luane School, Janett Billow, MD  Roselie Awkward, MD as Consulting Provider (Cardiology)     Patient Active Problem List    Diagnosis Date Noted    Essential hypertension 06/11/2016     Priority: High    Osteoporosis 10/15/2013     Priority: High     Fosamax started 2015?      Low back pain 10/15/2013     Priority: High    Benign neoplasm of colon 11/03/2013     Priority: Medium     Removed via colonscopy 2012; told to repeat 2017       AAA (abdominal aortic aneurysm) 10/15/2013     Priority: Medium     Due for repeat U/S 09/2016 ; Noted 06/02/13: 3.8X3.5 cm partially thrombosed; 03/2016 stable at 4.77 cm- continued surveillance with vascular surgery                Hyperlipidemia 10/15/2013     Priority: Medium    Wears dentures 06/11/2016     Priority: Low    Patient has healthcare proxy 06/11/2016     Priority: Low     [name] (son), Wallene Huh (sister)       Gallstones 10/23/2013     Priority: Low     Noted U/S: 06/02/13: There is gallbladder sludge/crushed gallstones. No gallbladder wall thickening, no sonographic Murphy's sign. Common bile duct 48mm.       GERD (gastroesophageal reflux disease) 10/15/2013     Priority: Low    AAA (abdominal aortic aneurysm) without rupture - s/p EVAR & Right CFA endarterectomy 11/14/16 10/26/2016     Past Medical History:   Diagnosis Date    AAA (abdominal aortic aneurysm)     Elevated LFTs     Unclear if this was related to statin, gallstones, alcohol consumption      Essential hypertension 06/11/2016    GERD (gastroesophageal reflux disease)     High blood pressure     HLD (hyperlipidemia)     Hx of colonic polyps     Colonscopy 08/11/10; repeat 2017 per GI     Lumbar disc herniation 2006    L5-S1    Osteoporosis     Spondylosis     Urinary retention      Past Surgical History:   Procedure Laterality Date    cataract repair Bilateral     COLONOSCOPY  2011    HYSTERECTOMY      left ovaries in place     TUBAL LIGATION       Family History   Problem Relation Age of Onset    Dementia Mother 86    Cancer Mother         lung    High cholesterol Mother     Stroke Mother     Parkinsonism Father     Heart Disease Father 88        passed    High cholesterol  Father     Heart attack Father     Cancer Sister         Breast; in recovery    High cholesterol Sister     Hip fracture Sister     Breast cancer Sister 87    Cancer Brother         prostate    High cholesterol Brother     Diabetes Brother     Aneurysm Neg Hx     Anesth problems Neg Hx      Social History     Social History    Marital status: Divorced     Spouse name: N/A    Number of children: N/A    Years of education: N/A     Occupational History    Not on file.     Social History Main Topics    Smoking status: Former Smoker     Packs/day: 1.00     Years: 30.00     Types: Cigarettes     Quit date: 06/15/2013    Smokeless tobacco: Never Used    Alcohol use 5.4 oz/week     9 Glasses of wine per week    Drug use: No    Sexual activity: Not Currently     Social History Narrative    Lives alone currently in townhome     2 brothers and 2 sisters that live here    2 boys ([place], [place])    Grandchildren        Objective:     Vital Signs: BP 124/70    Pulse 50    Ht 1.615 m (5' 3.58")    Wt 65.3 kg (144 lb)    SpO2 100%    BMI 25.04 kg/m    BMI: Body mass index is 25.04 kg/m.    Vision Screening Results (Welcome visit only):  No exam data present    Depression Screening Results:  Recent Review  Flowsheet Data     PHQ Scores 06/21/2017 02/15/2017 01/19/2016 12/22/2015    PSQ2 Q1 - Interest/Pleasure Y  N N N    PSQ2 Q2 - Down, Depressed, Hopeless Y N N N          Tobacco Use:  History   Smoking Status    Former Smoker    Packs/day: 1.00    Years: 30.00    Types: Cigarettes    Quit date: 06/15/2013   Smokeless Tobacco    Never Used       Activities of Daily Living/Functional Screening Results:       Assessment and Plan:      Cognitive Function:  Recall of recent and remote events appears:  Normal    The following health maintenance plan was reviewed with the patient:    Health Maintenance   Topic Date Due    HIV TESTING OFFERED  08[birth year]29[birth year]1952    COLON CANCER SCREENING 10 YEAR COLONOSCOPY  08/30/1974    IMM-ZOSTER (1 of 2) 08/29/1989    Lung Cancer Screening  08/30/1994    Breast Cancer Screening Other  08/27/2017    Fall Risk Screening  02/15/2018    DEPRESSION SCREEN YEARLY  06/22/2018    Bone Density Screening-Every 2 Years  08/28/2018    IMM-INFLUENZA  Completed    IMM-PREVNAR VACCINE 65 + YRS  Completed    IMM-PNEUMOVAX VACCINE 65 + YRS  Completed     This health maintenance schedule, identified risks, a list of orders placed today and  patient goals have been provided to [name] in the after visit summary.     Plan for any concerns identified during screening or risk assessments:   patient will get an x-ray that we ordered today for her lower back   pain complaints. She'll follow-up in a couple of weeks for further evaluation of her low back pain and plan development     patient refuses shingles vaccine of ordered a: Re: Test for her today this will be sent to her home and she is given instructions on completing this.      Norina Buzzard. [facility] FNP-C  [facility]  [phone] # [phone]

## 2017-07-08 LAB — AMB COLOGUARD (EXTERNAL LAB - EXACT SCIENCES): Cologuard (FIT DNA): POSITIVE — AB

## 2017-07-12 ENCOUNTER — Other Ambulatory Visit: Payer: Self-pay | Admitting: Primary Care

## 2017-07-12 ENCOUNTER — Ambulatory Visit: Payer: Medicare (Managed Care) | Attending: Family Medicine | Admitting: Family Medicine

## 2017-07-12 ENCOUNTER — Encounter: Payer: Self-pay | Admitting: Family Medicine

## 2017-07-12 VITALS — BP 124/82 | HR 78 | Ht 63.58 in | Wt 143.8 lb

## 2017-07-12 DIAGNOSIS — I10 Essential (primary) hypertension: Secondary | ICD-10-CM

## 2017-07-12 DIAGNOSIS — M545 Low back pain, unspecified: Secondary | ICD-10-CM

## 2017-07-12 MED ORDER — CYCLOBENZAPRINE HCL 5 MG PO TABS *I*
ORAL_TABLET | ORAL | 0 refills | Status: DC
Start: 2017-07-12 — End: 2017-07-18

## 2017-07-12 MED ORDER — ATENOLOL-CHLORTHALIDONE 50-25 MG PO TABS *A*
1.0000 | ORAL_TABLET | Freq: Every morning | ORAL | 1 refills | Status: DC
Start: 2017-07-12 — End: 2017-07-27

## 2017-07-12 NOTE — Telephone Encounter (Signed)
LOV  06/21/2017

## 2017-07-12 NOTE — Progress Notes (Signed)
Patient ID:  [name]  May 18, 1940    Subjective:    CC:  Follow-up (2 week f/u x-ray results)    HPI    Patient continues to struggle with a flare of her chronic lower back pain.  X-rays revealed some multiple site degenerative change with some arthritis.  She had had minimal improvement with physical therapy in the past but she limited her visits because of high cost.  She is now on Medicare and has better coverage.    Because of her history of GI bleed she's been instructed to discontinue any NSAID use.  She's wondering about other management.    [name] has a current medication list which includes the following prescription(s): losartan, amlodipine, alendronate, atenolol-chlorthalidone, loratadine, chlorhexidine, simvastatin, ondansetron, calcium carbonate-vitamin d, polyethylene glycol, aspirin, and cyclobenzaprine.  [name] is allergic to oxycodone and lipitor [atorvastatin].      Medications and allergies were reviewed and reconciled with the patient in eRecord today; and No changes were made.      Review of Systems   Musculoskeletal: Positive for back pain.        Ambulatory weakness         Objective:    Vitals:    07/12/17 0931   BP: 124/82   Pulse: 78   Weight: 65.2 kg (143 lb 12.8 oz)   Height: 1.615 m (5' 3.58")     Physical Exam   Constitutional: She is oriented to person, place, and time and well-developed, well-nourished, and in no distress.   Eyes: Pupils are equal, round, and reactive to light.   Neck: Normal range of motion.   Cardiovascular: Normal rate and regular rhythm.    Pulmonary/Chest: Effort normal.   Musculoskeletal: Normal range of motion.   Patient transfers fairly well from sitting to standing and ambulates and rotates fairly comfortably.  She has a negative straight leg raise for contralateral pain.  She is tight and tender in the paraspinous muscles of the lumbar spine bilaterally she's also centrally tender in the L4 L3 area.  Her lumbar spine is very straight.  Lower extremity  muscle development is bilaterally symmetrical.   Neurological: She is alert and oriented to person, place, and time.   Lower extremity reflexes are 2+ and bilaterally equal.   Skin: Skin is warm and dry.   Psychiatric: Mood, affect and judgment normal.       No results found for this or any previous visit (from the past 24 hour(s)).    Assessment/Plan:  [name] was seen today for follow-up.    Diagnoses and all orders for this visit:    Low back pain  -     cyclobenzaprine (FLEXERIL) 5 MG tablet; One or two tabs po HS.  -     AMB REFERRAL TO PHYSICAL / OCCUPATIONAL THERAPY      Patient's given instructions to work on some extension based exercises.  She's given some examples of exercises to work on.  I've also asked her to consider restarting physical therapy with instruction to work on the same.    Instructed regarding the use of Flexeril adverse effects including drowsiness.  Also instructed that she may use Tylenol in addition to Flexeril for extra pain relief as needed.    Return if symptoms worsen or fail to improve.    Romualdo Bolk FNP-C

## 2017-07-17 ENCOUNTER — Telehealth: Payer: Self-pay

## 2017-07-17 DIAGNOSIS — M791 Myalgia, unspecified site: Secondary | ICD-10-CM

## 2017-07-17 NOTE — Telephone Encounter (Signed)
Fax received from [facility] indicating that the Cyclobenzaprine is not covered by pts insurance. I called [facility] to find out if there is a preferred alternative and was told the Tizanidine would be covered if provider wanted to send that in instead.

## 2017-07-18 MED ORDER — TIZANIDINE HCL 4 MG PO TABS *I*
ORAL_TABLET | ORAL | 1 refills | Status: DC
Start: 2017-07-18 — End: 2018-08-04

## 2017-07-22 ENCOUNTER — Other Ambulatory Visit: Payer: Self-pay | Admitting: Family Medicine

## 2017-07-22 DIAGNOSIS — R195 Other fecal abnormalities: Secondary | ICD-10-CM

## 2017-07-23 ENCOUNTER — Other Ambulatory Visit: Payer: Self-pay | Admitting: Primary Care

## 2017-07-23 ENCOUNTER — Telehealth: Payer: Self-pay

## 2017-07-23 DIAGNOSIS — E7800 Pure hypercholesterolemia, unspecified: Secondary | ICD-10-CM

## 2017-07-23 NOTE — Telephone Encounter (Signed)
Patient returned call. Please call patient     Thank you,[name]

## 2017-07-23 NOTE — Telephone Encounter (Signed)
Please let patient know that her Corgard test is positive I have put in a referral and recommend that she proceed with colonoscopy. Transfer patient to secretaries so they can assist with contact information. (Routing comment)      07/23/2017 09:29 AM Phone (Outgoing) [name], [name] (Self) [phone] (H)    Left Message - to call back for results

## 2017-07-23 NOTE — Telephone Encounter (Signed)
07/23/2017 10:53 AM Phone (Outgoing) Eugene, Zeiders (Self) [phone] (H)    Spoke to Patient        Read verbatim Doug's message listed below.  Patient states understanding.  Patient states she has been having loose stools, no evidence of blood in her stool, denies pain. Patient will await call from secretary with details on colonoscopy appointment and where.   Advised to call back with any further questions or concerns.

## 2017-07-24 MED ORDER — SIMVASTATIN 20 MG PO TABS *I*
20.0000 mg | ORAL_TABLET | Freq: Every day | ORAL | 1 refills | Status: DC
Start: 2017-07-24 — End: 2018-01-13

## 2017-07-26 ENCOUNTER — Encounter: Payer: Self-pay | Admitting: Family Medicine

## 2017-07-26 ENCOUNTER — Telehealth: Payer: Self-pay

## 2017-07-26 ENCOUNTER — Encounter: Payer: Self-pay | Admitting: Gastroenterology

## 2017-07-26 DIAGNOSIS — I10 Essential (primary) hypertension: Secondary | ICD-10-CM

## 2017-07-26 NOTE — Telephone Encounter (Signed)
We should have patient call and ask to be on waiting list for sooner appointment otherwise January is okay

## 2017-07-26 NOTE — Telephone Encounter (Signed)
Call and spoke to patient to help her set up her colonoscopy appt. Because this referral is NOT urgent, she cant get an appointment until Jan 2020. Patient stated she has to go through [facility] because she is under the financial assistance program. Do you want to change the priority from routine to urgent? Please advise    Thank you,Shaundrea Carrigg Tamala Julian

## 2017-07-26 NOTE — Telephone Encounter (Signed)
Please advise whether this should be done at [facility]?

## 2017-07-26 NOTE — Telephone Encounter (Signed)
mailed

## 2017-07-26 NOTE — Telephone Encounter (Signed)
Ms. Boden is scheduled for a colonoscopy on 1/29 with Dr. Ronalee Red.  Please mail prep instructions to the address listed on file.   The patient can be reached if necessary at [phone].      Patient stated she has a abdominal aorta stent in and therefore could not have COD with Colon and Rectal Surgery. Writer unsure if COD needs to be scheduled at [facility]?...    1.  Is the patient taking any prescribed blood thinners? no     2.  Is the patient on any type of Kidney Dialysis? no    3.  Does the patient have any type of cardiac device or a defibrillator? no    4.  Is the patients weight greater than 350lbs? no    5. Does the patient have a current tracheotomy? no     6.  Does the patient use a machine to help them breathe at night? no    7.  Is the patient Diabetic? no    8.  Is the patient able to sign the consent form?  yes

## 2017-07-27 MED ORDER — ATENOLOL-CHLORTHALIDONE 50-25 MG PO TABS *A*
ORAL_TABLET | ORAL | 1 refills | Status: DC
Start: 2017-07-27 — End: 2018-01-30

## 2017-07-27 NOTE — Telephone Encounter (Signed)
See note below I sent in response to her my chart message.  Doug I assume you did not tell her to change...  Marcie Bal, any recall of how the sig got changed from 1/2 a day to 1 a day?  ----   I resent it now as 1/2 a day...  Rich        ==========  "Hmmm.   I am not sure how it got changed...   Somehow in the refill process it got changed.   Continue taking it as you were, 1/2 daily.  It doesn't matter whether you take in the AM or PM, unless it gets you up in the middle of the night to urinate....  Thanks for letting us know, and will correct the Rx.   Dr Raliegh Ip. "    ===View-only below this line===      ----- Message -----     From: [name]     Sent: 07/26/2017  2:00 PM EDT       To: Windle Guard, NP  Subject: Prescription Question    I just picked up a refill for atenolol and it says to take 1 every morning. I am       Now taking one half every evening. You didn't tell me about the change and I just wondered if that's correct.

## 2017-07-29 NOTE — Telephone Encounter (Signed)
Spoke to patient who v/u and will call to be put on waiting list.     Thank you,Sherrel Shafer Tamala Julian

## 2017-07-30 ENCOUNTER — Ambulatory Visit
Payer: Medicare (Managed Care) | Attending: Family Medicine | Admitting: Rehabilitative and Restorative Service Providers"

## 2017-07-30 DIAGNOSIS — M5136 Other intervertebral disc degeneration, lumbar region: Secondary | ICD-10-CM | POA: Insufficient documentation

## 2017-07-30 NOTE — Progress Notes (Signed)
Sent Via: eRecord EMR    Physician attestation: Physician:  Marvene Staff, NP   Per signature, I have reviewed and agree with the documented goals and Plan of Care    _____________________________________________________________________        [place] EVALUATION       Diagnosis: Low Back Pain   Onset date: June 2019   Date of surgery: NA     SUBJECTIVE    [name] is a 77 y.o. female who is present today for lumbar care.  Mechanism of injury/history of symptoms: Ermel presents today with Low back Pain that is worse with walking and standing but relieved with sitting. She states she has had low back pain for years, but in June had surgery for AAA repair and her pain has increased since then. She also reports numbness along the medial R thigh and instability in her R knee.     Nature of Symptoms  Relevant symptoms: Aching, Tingling, Decreased ROM, Decreased strength   Symptom frequency: Persistent  Symptom intensity (0 - 10 scale): Now 6 Best 0 Worst 10  Symptom location: Posterior bilateral low back and buttock  Symptoms worsen with: Standing, Walking   Symptoms improve with: Rest   Prior history of middle back pain.  - YES  Assistive Device: none    Previous Testing and/or Treatment  Diagnostic tests: X-ray  X-rays revealed some multiple site degenerative change with some arthritis  Previous or Concurrent Treatment: physical therapy      Medical Screen  Within the last 3 months, patient reports: changes in bowel and/or bladder function, no falls / trauma  History of Cancer: No  History of Smoking: Yes     Occupation and Activities  Work status: Retired  Pharmacist, community of work: Retired  Sports administrator: [employer]    Patients goals for therapy: Reduce pain, Increase strength, Return to sports / activities    FUNCTION    Functional Status of Walking, Standing or Sitting  Patient is able to stand 10 minutes before pain increases   Patient is able to walk 10 minutes (.5 mile )  without pain.  Patient is unable to transfer from sitting to standing without LBP.      OBJECTIVE    Observation and Postural Assessment  Patient was able to ambulate into the department with a normal gait pattern.      Posture  Cervcal lordosis: normal  Thoracic kyphosis:increased  Lumbar lordosis: increased    Lateral Deviation: None    LUMBAR AROM / Movement pattern  Flexion: moderate loss  / Reach to proximal knee with painful arc  Extension:  no loss in motion with normal movement  Left Sidebend: moderate loss / Reach to proximal knee with painful arc  Right Sidebend:  moderate loss /  Reach to proximal knee with painful arc  Left Rotation:  moderate loss with painful arc  Right Rotation:  moderate loss with painful arc      Neuromuscular Assessment  Myotomes:  LE Intact to gross screen      Palpation and Segmental Mobility    Generalized Lumbar Pain    Directional Preference Testing  Sitting Correction: symptoms reduced:  yes  Standing Correction: symptoms reduced:  Indifferent  Directional Preference: Extension bias, Neutral    Hip Screen:   FADIR,  positive  FABER,  positive  Straight Leg Raise,  positive  PROM; R IR 25  ER 50: L IR 25  ER 45  Special Tessts  Lumbar:  Slump, Right LE  negative, Left LE   negative  Straight Leg Raise,  Right LE  negative, Left LE   negative    Flexibility:   Hamstrings: Minor tightness  Quads: Moderate tightness  Psoas: Minor tightness    Mobility / Movement  Sit<->Stand:  Level of assistance, use of arms on chair /             Segmental Mobility Assessment  NA              ASSESSMENT  Findings consistent with 77 y.o. female with low back pain with pain, ROM limitations, strength limitations, functional limitations.  Significant presentation and prognostic factors include moderate to minimal symptom rating, medium disability rating, and evolving clinical status.   Based on clinical evaluation and assessment, the primary rehabilitation approach will include symptom  modulation.    Personal factors affecting treatment/recovery:   Lives alone   Smoker   Previous poor response to PT  Comorbidities affecting treatment/recovery:   Surgeries: s/p AAA repair 06/2017  Clinical presentation:   evolving  Patient complexity:     moderate level as indicated by above stability of condition, personal factors, environmental factors and comorbidities in addition to patient symptom presentation and impairments found on physical exam.    Prognosis:  Fair    Contraindications/Precautions/Limitation:  Per diagnosis    Short Term Goals:4week(s) Decrease c/o max pain to < 4/10, Increase ROM of lumbar spine to [facility]  and Minimal assistance with HEP/ education concepts   Long Term Goals:48month(s) Walk community distances without increased symptoms, Stand as long as needed when completing ADL's without increased symptoms, Ambulate stairs up and down without increased symptoms, Independent with symptom management    TREATMENT PLAN  Patient/family involved in developing goals and treatment plan: Yes    Recommended Treatment Freq 1-2 times per week(s) for 2 month(s)    Treatment plan inclusive of:   Exercise:AROM, PROM, Stretching, Strengthening, Progressive Resistive   Manual Techniques:Soft tissue release/massage    Modalities:  Cold pack, Functional/Therapeutic activites per flowsheet, Moist heat, Ther Exercise per flowsheet   Functional: Proprioception/Dynamic stability, Functional rehab, Gait training, Balance       Thank you for the referral of this patient to [place].    Kyra Leyland, PT    SL supported hip flexion 3x15"   Unilateral Bridge roll 5x B    Sit to stand  2x6 (2 airex, green TB @ knees& for core)                                                              TIME + FUNCTIONAL REPORTING    Treatment Start Time 2:00   Treatment End Time 2:45   Total Minutes of treatment 45 min       Total Non-Treatment time(rest, etc)    Total Service Based Min. of Treatment 30 min   Total  Time-Based minutes of treatment 15 min           Service-Based Procedures/ Modalities    Evaluation 30 min   Re-evaluation    Traction, mechanical    Electric stimulation (unattended)    Whirlpool    Vasopneumatic device    Total service-based billable procedures 30 min  Time-Based  Procedures / Modalities    Electric stimulation (manual)    Iontophoresis    Contrast baths    Ultrasound    Physical Performance Test    Therapeutic ex 15 min    Neuromuscular reed    Gait training, including stairs    Manual Therapy    Therapeutic Activities    Total Time-Based Billable Procedures 15 min              POC DATE: 07/30/2017

## 2017-07-31 ENCOUNTER — Encounter: Payer: Self-pay | Admitting: Vascular Surgery

## 2017-08-05 ENCOUNTER — Ambulatory Visit
Payer: Medicare (Managed Care) | Attending: Family Medicine | Admitting: Rehabilitative and Restorative Service Providers"

## 2017-08-05 DIAGNOSIS — M5136 Other intervertebral disc degeneration, lumbar region: Secondary | ICD-10-CM

## 2017-08-05 NOTE — Progress Notes (Signed)
[facility] Orthopedic Sports/Spine Rehabilitation  PT Treatment Note    Todays Date: 08/05/2017    Name: [name]  DOB: 1940-09-03  Referring Physician: Windle Guard, NP  Diagnosis:   1. DDD (degenerative disc disease), lumbar         Visit #: 2    Subjective:  Pain Assessment: 6  [name] reports that she is sore frmo triming bushes yesterday.       Objective:    Strength - Therapeutic Exercises per flowsheet, Home program   Function: - walked yesterday 13 min, but had a lot of lower back pain in the last 5 min  Education:  Updated HEP, Verbal cues for ther ex, Manual cues for ther ex    Objective       S/L tspine rotation 10"x10            SL supported hip flexion 3x15"   Unilateral Bridge roll 15x B    Bridge x20 Level I   Sit to stand  2x6 (1 airex, green TB @ knees& for core)                                                            Treatment:  Ther Exercise per flowsheet    Assessment:   S/L tspine rotation and bridge added to HEP. Cuing with sit-to-stand for decreased genu valgum.       Plan of Care:  Continue per Plan of care -  As written; Patient would benefit from skilled rehabilitation services to address the above impairments to restore functional capacity.    Thank you for referring this patient to [facility], PT          TIME + FUNCTIONAL REPORTING    Treatment Start Time 9:30   Treatment End Time 10:00   Total Minutes of treatment 30 min       Total Non-Treatment time(rest, etc)    Total Service Based Min. of Treatment    Total Time-Based minutes of treatment 30 min           Service-Based Procedures/ Modalities    Evaluation    Re-evaluation    Traction, mechanical    Electric stimulation (unattended)    Whirlpool    Vasopneumatic device    Total service-based billable procedures        Time-Based  Procedures / Modalities    Electric stimulation (manual)    Iontophoresis    Contrast baths    Ultrasound     Physical Performance Test    Therapeutic ex 30 min    Neuromuscular reed    Gait training, including stairs    Manual Therapy    Therapeutic Activities    Total Time-Based Billable Procedures 30 min              POC DATE: 07/30/2017

## 2017-08-09 ENCOUNTER — Other Ambulatory Visit: Payer: Self-pay | Admitting: Primary Care

## 2017-08-09 DIAGNOSIS — I10 Essential (primary) hypertension: Secondary | ICD-10-CM

## 2017-08-09 MED ORDER — AMLODIPINE BESYLATE 5 MG PO TABS *I*
5.0000 mg | ORAL_TABLET | Freq: Every day | ORAL | 5 refills | Status: DC
Start: 2017-08-09 — End: 2017-12-10

## 2017-08-09 NOTE — Telephone Encounter (Signed)
LOV 07/12/17  No future visit scheduled     Last resulted blood work 06/17/17    No future blood work ordered. JMA

## 2017-08-14 ENCOUNTER — Ambulatory Visit: Payer: Medicare (Managed Care) | Admitting: Rehabilitative and Restorative Service Providers"

## 2017-08-14 DIAGNOSIS — M5136 Other intervertebral disc degeneration, lumbar region: Secondary | ICD-10-CM

## 2017-08-14 NOTE — Progress Notes (Signed)
[name] Orthopedic Sports/Spine Rehabilitation  PT Treatment Note    Todays Date: 08/14/2017    Name: [name]  DOB: 07-01-1940  Referring Physician: Windle Guard, NP  Diagnosis:   1. DDD (degenerative disc disease), lumbar         Visit #: 3    Subjective:  Pain Assessment: 8  Tru notes that she is doing HEP but not seeing too much change yet. She is having a lot of GI dysfunction lately.       Objective:    Strength - Therapeutic Exercises per flowsheet, Home program   Function: - walks every day. walking 0.5 miles with pain at the lower back and buttock  Education:  Updated HEP, Verbal cues for ther ex, Manual cues for ther ex    Objective       S/L tspine rotation 10"x10 B scapular locked position       Curl-up 5"x10   SL supported hip flexion 10"x5 B   Unilateral Bridge roll 15x B    Bridge x20 Level I       Remedial side bridge x5 B   Clamshell x20 B   Sit to stand  x6 (1 airex, green TB @ knees)      Lateral band walk 4x20 band at knees                                                     Treatment:  Ther Exercise per flowsheet    Assessment:   Clamshell, remedial side bridge, curl-up, and lateral band walk added to HEP. Pain and difficulty with sit-to-stand, so this will be held from HEP.        Plan of Care:  Continue per Plan of care -  As written; Patient would benefit from skilled rehabilitation services to address the above impairments to restore functional capacity.    Thank you for referring this patient to [place], PT          TIME + FUNCTIONAL REPORTING    Treatment Start Time 9:30   Treatment End Time 10:00   Total Minutes of treatment 30 min       Total Non-Treatment time(rest, etc)    Total Service Based Min. of Treatment    Total Time-Based minutes of treatment 30 min           Service-Based Procedures/ Modalities    Evaluation    Re-evaluation    Traction, mechanical    Electric stimulation  (unattended)    Whirlpool    Vasopneumatic device    Total service-based billable procedures        Time-Based  Procedures / Modalities    Electric stimulation (manual)    Iontophoresis    Contrast baths    Ultrasound    Physical Performance Test    Therapeutic ex 30 min    Neuromuscular reed    Gait training, including stairs    Manual Therapy    Therapeutic Activities    Total Time-Based Billable Procedures 30 min              POC DATE: 07/30/2017

## 2017-08-19 ENCOUNTER — Encounter: Payer: Self-pay | Admitting: Primary Care

## 2017-08-19 ENCOUNTER — Ambulatory Visit
Payer: Medicare (Managed Care) | Attending: Family Medicine | Admitting: Rehabilitative and Restorative Service Providers"

## 2017-08-19 DIAGNOSIS — M5136 Other intervertebral disc degeneration, lumbar region: Secondary | ICD-10-CM

## 2017-08-19 DIAGNOSIS — R195 Other fecal abnormalities: Secondary | ICD-10-CM

## 2017-08-19 DIAGNOSIS — R194 Change in bowel habit: Secondary | ICD-10-CM

## 2017-08-19 NOTE — Progress Notes (Signed)
[facility] Orthopedic Sports/Spine Rehabilitation  PT Treatment Note    Todays Date: 08/19/2017    Name: [name]  DOB: 10/22/1940  Referring Physician: Windle Guard, NP  Diagnosis:   1. DDD (degenerative disc disease), lumbar         Visit #: 4    Subjective:  Pain Assessment: 8  Ashanta reports that she is not feeling any better and would like to pursue injections. She continues to have a lot of GI discomfort, but has to wait until January for a colonoscopy.       Objective:    Strength - Therapeutic Exercises per flowsheet, Home program   Function: - has not been able to return to yoga.  Education:  Updated HEP, Verbal cues for ther ex, Manual cues for ther ex    Objective       S/L tspine rotation 10"x10 B scapular locked position       Curl-up 5"x10   SL supported hip flexion    Unilateral Bridge roll    Bridge x20 Level I       Remedial side bridge    Clamshell    Sit to stand     Lateral band walk                                                      Treatment:  Ther Exercise per flowsheet    Assessment:   Kawanna will follow HEP as able until seeing spine specialist, with referral being made.        Plan of Care:  Continue per Plan of care -  Discharge, follow up as needed. [place] will call should questions/concerns arise.    Thank you for referring this patient to [place], PT          TIME + FUNCTIONAL REPORTING    Treatment Start Time 9:30   Treatment End Time 10:00   Total Minutes of treatment 30 min       Total Non-Treatment time(rest, etc)    Total Service Based Min. of Treatment    Total Time-Based minutes of treatment 30 min           Service-Based Procedures/ Modalities    Evaluation    Re-evaluation    Traction, mechanical    Electric stimulation (unattended)    Whirlpool    Vasopneumatic device    Total service-based billable procedures        Time-Based  Procedures / Modalities    Electric stimulation  (manual)    Iontophoresis    Contrast baths    Ultrasound    Physical Performance Test    Therapeutic ex 30 min    Neuromuscular reed    Gait training, including stairs    Manual Therapy    Therapeutic Activities    Total Time-Based Billable Procedures 30 min              POC DATE: 07/30/2017

## 2017-08-19 NOTE — Telephone Encounter (Signed)
Per patient, she is scheduled for a colonoscopy on January 29th. Do you want to see her before then?

## 2017-08-28 ENCOUNTER — Ambulatory Visit: Payer: Medicare (Managed Care) | Admitting: Rehabilitative and Restorative Service Providers"

## 2017-09-23 ENCOUNTER — Telehealth: Payer: Self-pay | Admitting: Primary Care

## 2017-09-23 NOTE — Telephone Encounter (Signed)
Patient  called and wanted to let Dr. Merrilyn Puma know that her [name] appoinrment is scheduled on 11/5 with Dr. Rosey Bath with [facility].

## 2017-09-25 ENCOUNTER — Ambulatory Visit: Payer: Medicare (Managed Care) | Attending: Primary Care | Admitting: Primary Care

## 2017-09-25 ENCOUNTER — Telehealth: Payer: Self-pay | Admitting: Primary Care

## 2017-09-25 ENCOUNTER — Telehealth: Payer: Self-pay

## 2017-09-25 VITALS — BP 126/78 | HR 56 | Wt 142.0 lb

## 2017-09-25 DIAGNOSIS — R1032 Left lower quadrant pain: Secondary | ICD-10-CM

## 2017-09-25 DIAGNOSIS — M545 Low back pain, unspecified: Secondary | ICD-10-CM

## 2017-09-25 DIAGNOSIS — R140 Abdominal distension (gaseous): Secondary | ICD-10-CM

## 2017-09-25 DIAGNOSIS — R195 Other fecal abnormalities: Secondary | ICD-10-CM

## 2017-09-25 DIAGNOSIS — F40240 Claustrophobia: Secondary | ICD-10-CM | POA: Insufficient documentation

## 2017-09-25 NOTE — Progress Notes (Addendum)
[name] is 77 y.o. female presenting for follow up.  Per nursing note, Chief Complaint: GI Problem (loose stools and abdominal cramping ); Dizziness (off and on all day ); and Leg Pain (soreness in thighs )    77 y.o. female with hx of HTN, AAA, Osteoporosis; Hyperlipidemia; GERD, Back pain; Gallstones; Benign neoplasm of colon;    Last Visit in this Department: 07/12/2017  Today notes:    Some GI Sx  ever since surgery 11/24/16.   Some GI Sx off and on....  Doug told cologuard.  Thought Bug and waited....  No Blood, Hx of internal and exteral Hemorrhoids.... Worse with constipated.   evey sjuly loose 2-3 times.   Used to be contipated,   Since July   ----  If passes gas or urinates some leakage.  Wearing depends....  Light.     Bloated   Belly since surgery like its gas.....   No fevers.     ~ 2000 hospital for abd pain, worried appe ruptured....  hosp and never a confident answer. signoidoscopy neg.   ======  Back. Starts on L can be bilateral.   Had for years.....   Doug sent to PT   PT x 3 times not better.   2nd time.   Die the HEP faithfully.  Can't make bed, sweep etc.   ----   Good response to injections in the past in [place].  Seen by Dr Karle Starch. He won't repeat injections without an MRI.   Exceeding claustrophobia and has not been able to do an MRI even if open MRI and premedication.  Says [facility] has a new open MRI....   Very bad claustrophobia, stuck in Elevator and made worse.   Open MRI in [place] [facility] but brochure she shows is [facility] [place].   [place], [address] [phone]?   With Walking to R buttock and upper thigh...  Legs weak, can't due the yoga positions so stopped.    PROBL:  Patient Active Problem List    Diagnosis Date Noted    Essential hypertension 06/11/2016     Priority: High    Osteoporosis 10/15/2013     Priority: High     Fosamax started 2015?      Low back pain 10/15/2013     Priority: High    Benign neoplasm of colon 11/03/2013     Priority: Medium     Removed via  colonscopy 2012; told to repeat 2017       AAA (abdominal aortic aneurysm) 10/15/2013     Priority: Medium     Due for repeat U/S 09/2016 ; Noted 06/02/13: 3.8X3.5 cm partially thrombosed; 03/2016 stable at 4.77 cm- continued surveillance with vascular surgery                Hyperlipidemia 10/15/2013     Priority: Medium    Wears dentures 06/11/2016     Priority: Low    Patient has healthcare proxy 06/11/2016     Priority: Low     Carola Rhine (son), Wallene Huh (sister)       Gallstones 10/23/2013     Priority: Low     Noted U/S: 06/02/13: There is gallbladder sludge/crushed gallstones. No gallbladder wall thickening, no sonographic Murphy's sign. Common bile duct 62mm.       GERD (gastroesophageal reflux disease) 10/15/2013     Priority: Low    Positive colorectal cancer screening using Cologuard test 09/25/2017    Claustrophobia 09/25/2017    AAA (  abdominal aortic aneurysm) without rupture - s/p EVAR & Right CFA endarterectomy 11/14/16 10/26/2016     CURRENTMEDS  Current Outpatient Prescriptions   Medication Sig    amLODIPine (NORVASC) 5 MG tablet Take 1 tablet (5 mg total) by mouth daily    atenolol-chlorthalidone (TENORETIC 50) 50-25 MG per tablet 1/2 tablet daily    simvastatin (ZOCOR) 20 MG tablet Take 1 tablet (20 mg total) by mouth daily (with dinner)    losartan (COZAAR) 50 MG tablet Take 1 tablet (50 mg total) by mouth daily    alendronate (FOSAMAX) 70 MG tablet Take 1 tablet (70 mg total) by mouth every 7 days   Take with a full glass of water. Do not eat or lie down for 30 min.    loratadine (CLARITIN) 10 MG tablet Take 10 mg by mouth daily    Calcium Carbonate-Vitamin D (CALCIUM 500 + D PO) Take 1 tablet by mouth 2 times daily    aspirin 81 MG tablet Take 1 tablet (81 mg total) by mouth daily    tiZANidine (ZANAFLEX) 4 MG tablet Take 1 tablet (4 mg total) by mouth every 6-8 hours as needed for muscle pain    polyethylene glycol (GLYCOLAX,MIRALAX) powder packet Take 17 g by mouth daily as  needed       Allergies   Allergen Reactions    Oxycodone Nausea And Vomiting     When in hospital for AAA repair    Lipitor [Atorvastatin] Other (See Comments)     Elevated LFTs     SURGICALHX  Past Surgical History:   Procedure Laterality Date    cataract repair Bilateral     COLONOSCOPY  2011    HYSTERECTOMY      left ovaries in place     TUBAL LIGATION       SOC  Social History     Social History    Marital status: Divorced     Spouse name: N/A    Number of children: N/A    Years of education: N/A     Occupational History    Not on file.     Social History Main Topics    Smoking status: Former Smoker     Packs/day: 1.00     Years: 30.00     Types: Cigarettes     Quit date: 06/15/2013    Smokeless tobacco: Never Used    Alcohol use 5.4 oz/week     9 Glasses of wine per week    Drug use: No    Sexual activity: Not Currently     Social History Narrative    Lives alone currently in townhome     2 brothers and 2 sisters that live here    2 boys ([place], [place])    Grandchildren          Family History: see Family Comments in the [employer] Panel.     Review of Systems   General: No fever or chills, no significant weight loss.   HEENT: No headaches, change in vision, throat pain, hoarseness.  Pulm: No SOB/Wheezing.  Cardiac: No chest pain, palpitations, syncope or pre-syncope, or lower extremity edema.   GI: no abdominal pain, diarrhea, constipation, melena, or blood per rectum.  GU: no dysuria, polyuria, or hematuria.     Physical Exam     BP 126/78    Pulse 56    Wt 64.4 kg (142 lb)    SpO2 100%    BMI 24.70 kg/m  GEN: Alert, pleasant well adult in NAD.   HEENT: Normocephalic and atraumatic. PERRL. EOMI. Conjunctiva not pale.  Ears: TMs WNL.    Mouth: No pharyngeal lesions, erythema or exudate. Moist mucous membranes.  NECK: Supple, no lymphadenopathy or thyromegaly or JVD.   PULM: Clear to auscultation bilaterally.   CVS: RRR, no murmur, normal S1& S2.   ABD: Soft. Normal bowel sounds. No  hepatosplenomegaly, masses. LLQ tenderness.  No rebound tenderness.    EXTREMITIES: No cyanosis, clubbing or edema.  SKIN: No rashes or concerning lesions.    MSK tender L3-L5 bilaterally, L a little mor than R.   +SLR    ASSESSMENT/PLAN- For this encounter:  (See also Comprehensive Assessment/Plan below)    1. Abdominal pain, left lower quadrant  C reactive protein    Sedimentation rate, automated    CBC and differential   2. Abdominal bloating     3. Loose stools     4. Positive colorectal cancer screening using Cologuard test     5. Low back pain     6. Claustrophobia       Colonoscopy currently scheduled for 01/29/17, but needs to be done sooner.  I referred to [facility] and she was offered a November 5 appt, but she has [facility], so has to be done in the [employer].   With her positive cologuard, and new left lower quadrant abd pain, bloating, and frequent stools which is new, will refer with an urgent rather than routine status.  ---  called GI 09/25/2017 to attempt to move up.  Spoke with [employer]  to [employer]. High priority specialist. For sooner appointments.   =====  Progressively worsening LBP. PT has not helped at all.  Responded well in the past to injections in [place] but Dr Gaetano Hawthorne wants an MRI before proceeding. She is extremely claustrophobic and did not tolerate a regular or open MRI in [place]. Will try and schedule an Open MRI at the new [facility] which is much more open. Would offer a benzo in advance as well.     Comprehensive Assessment/Plan-  Pulled forward. May include some old information.   Abd pain/Bloatings/Loose stools/+Cologuard-   referred to GI 07/22/17 and colonoscopy scheduled for 01/29/17, but needs to be done sooner.  I referred to [facility] and she was offered a November 5 appt, but she has [facility], so has to be done in the [employer].   With her positive cologuard, and new left lower quadrant abd pain, bloating, and frequent stools which is new, will  refer with an urgent rather than routine status.    R upper lumbar pain:   Try tramadol if not gradually improving, could call and referral to pain clinic.   She would like to repeat the injections that worked so well in [place] 8-9 years ago by her rheumatologist Dr Harrington Challenger, but Dr Karle Starch apparently wanted to do an MRI first and she has severe claustrophobia even with open MRI. In [place].  09/25/2017 will see if can get the new even more open MRI.   Claustrophobia- unable to do standard or open MRI in [place].   Willing to give a benzo before open MRI  R meralgia paresthetica- Started with procedure 11/14/16, presumably due to positioning.   Neuropathic pain- Gaba 300 mg trial 2-2---> even 2-2-2  Toe pain- XRay.   AAA- Vascular surgery-Kathleen Raman.       EVAR & Right CFA endarterectomy 11/14/16  Osteoporosis- Dexa showed T score -3.3 12/10/13, ordered by Dr Ruthy Dick.   Fosamax 70 mgweekly. To take 5 yrs and the consider stopping for a holiday.   Ca Vit D 500/200 BID  Check Vit D. 79 (09/27/16).   Could consider osteoporosis eval, Durwin Reges, M.D.et al  HTN-       Atenolol cholorthilisone 50/25 daily       Losartan- 50 mg daily   Hyperlipidemia-       simvastatin 20mg  daily. Past 40. Side effects with Atrorva and stopped. Increase back to 40.   Recheck FLP before next visit.   GERD- Past omeprazole 20 mg BID. Off. No further heartburn.  Belching- Gavascon PRN. helps.   Low Back pain- R sided sciatica.  MRI- 11/20/00 [facility]. L5-S1 right foraminal and far lateral disc protrusion/herniation that touches the existing right L5 nerve root. There is moderate right foraminal and mild left foraminal stenosis.  01/26/15: Increase gaba 300 mg, to1 in the AM, 2 at bedtime. 300mg  bid .   LS Spine XR 12/22/15- disc space narrowing at all levels.   11/29/16: Try tramadol if not gradually improving, could call and referral to pain clinic.  She would like to repeat the injections that worked so  well in [place] 8-9 years ago by her rheumatologist Dr Harrington Challenger, but Dr Karle Starch apparently wanted to do an MRI first and she has severe claustrophobia even with open MRI.    Gallstones- asymptomatic.   Benign neoplasm of colon- repeat colonoscopy due 2017.   Constipation- improved with magnesium supplement.   Aspirin 81 mg daily   Health Maintenance-   --Lipids: LDL 137 04/15/14.   --CRC Screening: 2012. Repeat due 2017.   [place] Dr Bearl Mulberry, [facility] or [place].         One of offices phone: [phone]  Female:   Pap:   Mammo: 12/10/13. 08/27/16    Immunizations:  --Tdap: 06/11/2016  --Flu: 11/04/13, 2016 [facility] Gaspar Garbe. 11/05/2016  --Shingles:      Shingrix- discussed 11/29/2016. Can't afford, would cost $167.   --Prevnar: 10/15/2013  --Pneumovax: 12/10/2014 [facility]. (changing). Preferred [place]     Consultants:   Vascular surgery- 08/27/14 Cathren Harsh.        Repair 11/14/16. FU due 12/14/16.  Ortho\Spinal Ortho- Dr Karle Starch.     Return if symptoms worsen or fail to improve.     There are no Patient Instructions on file for this visit.  Pilar Plate, MD

## 2017-09-25 NOTE — Telephone Encounter (Signed)
Patient called concerned because she saw on the news that losartan has been recalled.  Patient did not take her medication today and would like to know if she should continue taking this?  Please advise.

## 2017-09-25 NOTE — Telephone Encounter (Signed)
Ms. Pinera is calling to schedule an appointment with GI. The patient would like to be seen urgently for   R10.32 (ICD-10-CM) - Abdominal pain, left lower quadrant   R14.0 (ICD-10-CM) - Abdominal bloating   R19.5 (ICD-10-CM) - Loose stools   R19.5 (ICD-10-CM) - Positive colorectal cancer screening using Cologuard test     He can be reached at [phone].

## 2017-09-25 NOTE — Telephone Encounter (Signed)
25/2019 10:23 AM Phone (Outgoing) [name], [name] (Self) [phone] (H)    Spoke to Patient        Advised patient that her Losartan she is getting from [facility] is NOT the one from the recall.  [facility] has never dispensed the recalled brand.   Patient OK with this.     Patient also states she is having some GI issues and her Cologard test came back positive and DR Merrilyn Puma wanted her to have a colonoscopy.  DR Ronalee Red is scheduling into Jan 2020.      Attempted to get sooner appointment with the [facility] DR Rosey Bath 11/5, but they do not accept financial assistance and the cost of the procedure would be $350 which patient states she cannot afford.     Patient states she has been having loose stools for the past 2 months, and occasionally has fecal incontinence, is still having some abdominal cramping.  Would like to schedule appointment with DR [facility] appointment for today at 11:00 am and patient accepted.     Call to front staff Regan Lemming, appointment scheduled

## 2017-09-25 NOTE — Telephone Encounter (Signed)
Referral sent to [facility] for review

## 2017-09-26 ENCOUNTER — Telehealth: Payer: Self-pay

## 2017-09-26 NOTE — Telephone Encounter (Signed)
Pt is scheduled and notified for 10/31/17 1000, no anticoags, instructions to be sent 10/16/17.

## 2017-09-26 NOTE — Progress Notes (Signed)
Thanks Dr. Merrilyn Puma

## 2017-09-27 ENCOUNTER — Other Ambulatory Visit: Payer: Self-pay | Admitting: Primary Care

## 2017-09-27 DIAGNOSIS — M810 Age-related osteoporosis without current pathological fracture: Secondary | ICD-10-CM

## 2017-09-27 MED ORDER — ALENDRONATE SODIUM 70 MG PO TABS *I*
70.0000 mg | ORAL_TABLET | ORAL | 1 refills | Status: DC
Start: 2017-09-27 — End: 2018-03-13

## 2017-09-27 NOTE — Telephone Encounter (Signed)
lov 09/25/17  Nov none

## 2017-10-08 ENCOUNTER — Ambulatory Visit
Admission: RE | Admit: 2017-10-08 | Discharge: 2017-10-08 | Disposition: A | Discharge status: Home / Self Care | Payer: Medicare (Managed Care) | Source: Ambulatory Visit | Attending: Primary Care | Admitting: Primary Care

## 2017-10-08 ENCOUNTER — Other Ambulatory Visit: Payer: Self-pay

## 2017-10-08 DIAGNOSIS — Z1231 Encounter for screening mammogram for malignant neoplasm of breast: Secondary | ICD-10-CM

## 2017-10-08 DIAGNOSIS — Z803 Family history of malignant neoplasm of breast: Secondary | ICD-10-CM

## 2017-10-09 ENCOUNTER — Other Ambulatory Visit
Admission: RE | Admit: 2017-10-09 | Discharge: 2017-10-09 | Disposition: A | Discharge status: Home / Self Care | Payer: Medicare (Managed Care) | Source: Ambulatory Visit | Attending: Primary Care | Admitting: Primary Care

## 2017-10-09 DIAGNOSIS — R1032 Left lower quadrant pain: Secondary | ICD-10-CM | POA: Insufficient documentation

## 2017-10-09 LAB — CBC AND DIFFERENTIAL
Baso # K/uL: 0.1 10*3/uL (ref 0.0–0.1)
Basophil %: 1.5 %
Eos # K/uL: 0.2 10*3/uL (ref 0.0–0.4)
Eosinophil %: 3.4 %
Hematocrit: 42 % (ref 34–45)
Hemoglobin: 13.2 g/dL (ref 11.2–15.7)
IMM Granulocytes #: 0 10*3/uL
IMM Granulocytes: 0.2 %
Lymph # K/uL: 1.6 10*3/uL (ref 1.2–3.7)
Lymphocyte %: 34.2 %
MCH: 30 pg/cell (ref 26–32)
MCHC: 32 g/dL (ref 32–36)
MCV: 95 fL (ref 79–95)
Mono # K/uL: 0.5 10*3/uL (ref 0.2–0.9)
Monocyte %: 11.4 %
Neut # K/uL: 2.3 10*3/uL (ref 1.6–6.1)
Nucl RBC # K/uL: 0 10*3/uL (ref 0.0–0.0)
Nucl RBC %: 0 /100 WBC (ref 0.0–0.2)
Platelets: 270 10*3/uL (ref 160–370)
RBC: 4.4 MIL/uL (ref 3.9–5.2)
RDW: 13.2 % (ref 11.7–14.4)
Seg Neut %: 49.3 %
WBC: 4.7 10*3/uL (ref 4.0–10.0)

## 2017-10-09 LAB — SEDIMENTATION RATE, AUTOMATED: Sedimentation Rate: 23 mm/hr (ref 0–30)

## 2017-10-10 LAB — CRP: CRP: 4 mg/L (ref 0–10)

## 2017-10-14 ENCOUNTER — Telehealth: Payer: Self-pay | Admitting: Gastroenterology

## 2017-10-14 NOTE — Telephone Encounter (Signed)
Copied from [place] [address][id number]. Topic: Information Request - Other  >> Oct 14, 2017  9:50 AM Noralee Space B wrote:  Patient would like COD prep for 10/31 COD mailed to home address. Patient can be reached at [phone]

## 2017-10-17 ENCOUNTER — Encounter: Payer: Self-pay | Admitting: Gastroenterology

## 2017-10-17 ENCOUNTER — Telehealth: Payer: Self-pay | Admitting: Gastroenterology

## 2017-10-17 NOTE — Telephone Encounter (Signed)
Copied from [place]. Topic: Information Request - Other  >> Oct 17, 2017  3:09 PM Caryl Pina wrote:  Type of information request: Please send 10/31/17 COD prep insrructions to patient's MyChart account. Shecan be reached if necessary at [phone].

## 2017-10-17 NOTE — Telephone Encounter (Signed)
Instructions were sent to the mychart this morning

## 2017-10-24 ENCOUNTER — Telehealth: Payer: Self-pay

## 2017-10-24 NOTE — Telephone Encounter (Signed)
Copied from [place]. Topic: Appointments - Appointment Information  >> Oct 24, 2017  2:11 PM Lillia Carmel D wrote:  Ms. Whipkey called to confirm upcoming appointment for:    Date: 10/31/17  Time: 10AM  Location: [facility]  Provider: Dr. Talitha Givens    The patient can be reached if necessary at [phone].

## 2017-10-24 NOTE — Telephone Encounter (Signed)
Left message to confirm 10/31 at 1000, detailed need for driver, left number for questions on prep. Made patient aware to return call to confirm they have received voicemail.

## 2017-10-31 ENCOUNTER — Ambulatory Visit
Admission: RE | Admit: 2017-10-31 | Discharge: 2017-10-31 | Disposition: A | Discharge status: Home / Self Care | Payer: Medicare (Managed Care) | Source: Ambulatory Visit | Attending: Gastroenterology | Admitting: Gastroenterology

## 2017-10-31 ENCOUNTER — Encounter: Payer: Self-pay | Admitting: Gastroenterology

## 2017-10-31 DIAGNOSIS — Z86010 Personal history of colonic polyps: Secondary | ICD-10-CM | POA: Insufficient documentation

## 2017-10-31 DIAGNOSIS — K648 Other hemorrhoids: Secondary | ICD-10-CM | POA: Insufficient documentation

## 2017-10-31 DIAGNOSIS — K5730 Diverticulosis of large intestine without perforation or abscess without bleeding: Secondary | ICD-10-CM | POA: Insufficient documentation

## 2017-10-31 DIAGNOSIS — K649 Unspecified hemorrhoids: Secondary | ICD-10-CM

## 2017-10-31 DIAGNOSIS — R195 Other fecal abnormalities: Secondary | ICD-10-CM | POA: Insufficient documentation

## 2017-10-31 LAB — HM COLONOSCOPY

## 2017-10-31 MED ORDER — MIDAZOLAM HCL 5 MG/5ML IJ SOLN *I*
INTRAMUSCULAR | Status: AC
Start: 2017-10-31 — End: 2017-10-31
  Filled 2017-10-31: qty 10

## 2017-10-31 MED ORDER — MEPERIDINE HCL 50 MG/ML IJ SOLN *WRAPPED*
INTRAMUSCULAR | Status: AC | PRN
Start: 2017-10-31 — End: 2017-10-31
  Administered 2017-10-31: 50 mg via INTRAVENOUS
  Administered 2017-10-31: 25 mg via INTRAVENOUS

## 2017-10-31 MED ORDER — MIDAZOLAM HCL 1 MG/ML IJ SOLN *I* WRAPPED
INTRAMUSCULAR | Status: AC | PRN
Start: 2017-10-31 — End: 2017-10-31
  Administered 2017-10-31: 1 mg via INTRAVENOUS
  Administered 2017-10-31: 2 mg via INTRAVENOUS
  Administered 2017-10-31 (×2): 1 mg via INTRAVENOUS

## 2017-10-31 MED ORDER — LACTATED RINGERS IV SOLN *I*
100.0000 mL/h | INTRAVENOUS | Status: DC
Start: 2017-10-31 — End: 2017-11-01
  Administered 2017-10-31: 100 mL/h via INTRAVENOUS

## 2017-10-31 MED ORDER — MEPERIDINE HCL 50 MG/ML IJ SOLN *WRAPPED*
INTRAMUSCULAR | Status: AC
Start: 2017-10-31 — End: 2017-10-31
  Filled 2017-10-31: qty 2

## 2017-10-31 NOTE — Discharge Instructions (Signed)
Gastroenterology Unit  Discharge Instructions for Colonoscopy      10/31/2017    12:12 PM    Colonoscopy    Do not drive, operate heavy machinery, drink alcoholic beverages, make important personal or business decisions, or sign legal documents until the next day.      Return to your usual diet   Return to taking your usual medications    Things you may expect:   A small amount of bright red blood in your stool   It may be a few days before you have a bowel movement   You may have cramping, bloating, and feelings of "gas". These feelings should go away as you pass gas. If you still feel uncomfortable, walking around will help to pass the gas.   You were given medication to help you relax during the test. You may feel "fuzzy" and drowsy. Go home and rest for at least 4-6 hours.    You should call your doctor for any of the following:   Bad stomach pain   Fever   Bright red bleeding or clots (This may happen up to 20 days after the test.)   Dizziness or weakness that gets worse or lasts up to 24 hours.   Pain or redness at the IV site    If you have a serious problem after hours, Call [phone] to reach the GI physician on call. If you are unable to reach your doctor, go to the [facility] Emergency Department.    Follow Up Care:   There were no polyps or cancer detected in the colon.      You do not need any further GI studies.     New Prescriptions    No medications on file

## 2017-10-31 NOTE — Procedures (Addendum)
Colonoscopy Procedure Note   Date of Procedure: 10/31/2017   Referring Physician: Windle Guard, NP  Primary Physician: Pilar Plate, MD        Attending Physician: Roanna Raider, MD  Fellow:   Brett Canales, MD  Indications:  Positive Cologuard test.  Previous colonoscopy: Yes -- Date: 2011 in [place]  Medications: Meperidine 75 mg IV and Midazolam 5 mg IV were administered incrementally over the course of the procedure to achieve an adequate level of conscious sedation.    Moderate Sedation Face Times  Start Time: 7371  End Time: 1210  Duration (minutes): 44 Minutes    Assessment, ordering, supervision and monitoring of moderate sedation was performed by the endoscopy attending throughout the case from the first dose of sedation until patient left procedural area.  I was present throughout the entire moderate sedation.  Roanna Raider, MD    Scopes: [id number]    Procedure Details: Full disclosures of risks were reviewed with patient as detailed on the consent form. The patient was placed in the left lateral decubitus position and monitored with continuous pulse oximetry, interval blood pressure monitoring and direct observations.   After anorectal examination was performed, the colonoscope was inserted into the rectum and advanced under direct vision to the cecum, which was identified by  the ileocecal valve and the appendiceal orifice. The procedure was considered more difficult than average. Additional maneuvers used: applied abdominal pressure  During withdrawal examination, the final quality of the prep was Boston Bowel Prep Scale:  Right Colon: Grade3- (entire mucosa of colon segment seen well, with no residual staining, small fragments of stool, or opaque liquid)  Transverse Colon: Grade 3- (entire mucosa of colon segment seen well, with no residual staining, small fragments of stool, or opaque liquid)  Left Colon: Grade 3- (entire mucosa of colon segment seen well, with no residual staining,  small fragments of stool, or opaque liquid)  A careful inspection was made as the colonoscope was withdrawn. A retroflexed view of the rectum was performed; findings and interventions are described below. The patient was recovered in the GI recovery area.     Scope Times:     Insertion Time: 1132  Cecum Time: 4627  Exit Time: 1210  Withdrawal time -17 min    Findings:  Normal perineum and anus on DRE without fissure/fistula/stricture/hemorrhoids or tags. Scope passed to cecum. Well prepped. Full view of distended caput cecum including medial wall from valve to AO was obtained, and photodocumented. No polyps or cancer. Mild diverticulosis throughout. Small internal hemorrhoids, retroflexed view of the rectum otherwise within normal limits..    Intervention(s):      None      Complication(s):     none    EBL: 0 ml    Impression(s):     Normal colonoscopy to the cecum without any mucosal irregularities, polyps or masses   Mild pan diverticulosis   Small internal hemorrhoids.    Recommendation(s):     No further GI workup recommended.     Histopathologic Diagnosis:  none    Brett Canales, MD     GI ATTENDING  I was present during the entire viewing portion of this endoscopy procedure, including from the moment of scope insertion until the completion of the scope withdrawal.  I agree with the fellow's documentation of the procedure.  Dr. Talitha Givens

## 2017-10-31 NOTE — Preop H&P (Addendum)
OUTPATIENT PRE-PROCEDURE H&P    Chief Complaint / Indications for Procedure: +cologard    Past Medical History:     Past Medical History:   Diagnosis Date    AAA (abdominal aortic aneurysm)     Elevated LFTs     Unclear if this was related to statin, gallstones, alcohol consumption     Essential hypertension 06/11/2016    GERD (gastroesophageal reflux disease)     High blood pressure     HLD (hyperlipidemia)     Hx of colonic polyps     Colonscopy 08/11/10; repeat 2017 per GI     Lumbar disc herniation 2006    L5-S1    Osteoporosis     Spondylosis     Urinary retention      Past Surgical History:   Procedure Laterality Date    cataract repair Bilateral     COLONOSCOPY  2011    HYSTERECTOMY      left ovaries in place     TUBAL LIGATION       Family History   Problem Relation Age of Onset    Dementia Mother 58    Cancer Mother         lung    High cholesterol Mother     Stroke Mother     Parkinsonism Father     Heart Disease Father 62        passed    High cholesterol Father     Heart attack Father     Cancer Sister         Breast; in recovery    High cholesterol Sister     Hip fracture Sister     Breast cancer Sister 46    Cancer Brother         prostate    High cholesterol Brother     Diabetes Brother     Aneurysm Neg Hx     Anesthesia problems Neg Hx     Colon cancer Neg Hx     Colon polyps Neg Hx      Social History     Socioeconomic History    Marital status: Divorced     Spouse name: Not on file    Number of children: Not on file    Years of education: Not on file    Highest education level: Not on file   Tobacco Use    Smoking status: Former Smoker     Packs/day: 1.00     Years: 30.00     Pack years: 30.00     Types: Cigarettes     Last attempt to quit: 06/15/2013     Years since quitting: 4.3    Smokeless tobacco: Never Used   Substance and Sexual Activity    Alcohol use: Yes     Alcohol/week: 9.0 standard drinks     Types: 9 Glasses of wine per week    Drug use: No     Sexual activity: Not Currently   Other Topics Concern    Special Diet Not Asked    Exercise Yes    Weight Concern Yes    Caffeine Concern Not Asked   Social History Narrative    Lives alone currently in townhome     2 brothers and 2 sisters that live here    2 boys ([place], [place])    Grandchildren        Allergies:    Allergies   Allergen Reactions    Oxycodone Nausea And  Vomiting     When in hospital for AAA repair    Lipitor [Atorvastatin] Other (See Comments)     Elevated LFTs       Medications:  (Not in a hospital admission)     Current Outpatient Medications   Medication    alendronate (FOSAMAX) 70 MG tablet    amLODIPine (NORVASC) 5 MG tablet    atenolol-chlorthalidone (TENORETIC 50) 50-25 MG per tablet    simvastatin (ZOCOR) 20 MG tablet    losartan (COZAAR) 50 MG tablet    loratadine (CLARITIN) 10 MG tablet    Calcium Carbonate-Vitamin D (CALCIUM 500 + D PO)    aspirin 81 MG tablet    tiZANidine (ZANAFLEX) 4 MG tablet     Current Facility-Administered Medications   Medication Dose Route Frequency    Lactated Ringers Infusion  100 mL/hr Intravenous Continuous     Vitals:    10/31/17 1028   BP: 136/66   Pulse: 59   Resp: 18   Temp: 36.6 C (97.9 F)   Weight: 63.5 kg (140 lb)   Height: 160 cm (5\' 3" )       ROS:  [id number]    Physical Examination:  Head/Nose/Throat:negative  Lungs:Lungs clear  Cardiovascular:normal S1 and S2  Abdomen: abdomen soft, non-tender, nondistended, normal active bowel sounds, no masses or organomegaly      Lab Results: none    Radiology impressions (last 30 days):  Mammography Screening Bilateral    Result Date: 10/18/2017  10/08/2017 10:48 AM BILATERAL SCREENING MAMMOGRAM LOCATION:  [facility]. LAST CBE:  More than one year ago. FAMILY HISTORY:  The patient reports a family history of breast cancer in her sister at age 40. PATIENT HISTORY:  The patient has no current breast related complaints.  The patient has no personal  history of breast cancer. TECHNIQUE:  CC and MLO views of both breasts were obtained using full field digital mammography with CAD. COMPARISON:  Comparison was made with prior studies, most recent from [facility] dated 4/74/2595. FINDINGS:  Density:  b. There are scattered areas of fibroglandular density. No dominant mass, significant asymmetry or suspicious calcification in either breast.     No mammographic evidence of malignancy. FOLLOW-UP:  Screening mammography is recommended in one year. We will mail the patient these results and a reminder when it is time for her next examination.  BI-RADS ASSESSMENT CATEGORY:  1: Negative. Mammography performed at [facility] complies with the Mammography Quality Standards Act of 1992, Part 900 - Mammography; also with [place] criteria and procedures for a mammography screening program as described in [facility]  16.22. I have personally reviewed the images and the Resident's/Fellow's interpretation and agree with or edited the findings. [facility] submits this DICOM format image data and final report to the [facility], an independent secure electronic health information exchange, on a reciprocally searchable basis (with patient authorization) for a minimum of 12 months after exam date.      Currently Active Problems:  Patient Active Problem List   Diagnosis Code    AAA (abdominal aortic aneurysm) I71.4    Osteoporosis M81.0    Hyperlipidemia E78.5    GERD (gastroesophageal reflux disease) K21.9    Low back pain M54.5    Gallstones K80.20    Benign neoplasm of colon D12.6    Essential hypertension I10    Wears dentures Z97.2  Patient has healthcare proxy Z78.9    AAA (abdominal aortic aneurysm) without rupture - s/p EVAR & Right CFA endarterectomy 11/14/16 I71.4    Positive colorectal cancer screening using Cologuard test R19.5    Claustrophobia F40.240        Impression:  Surveillance  colonoscopy    Plan:  Colonoscopy  Moderate Sedation    UPDATES TO PATIENT'S CONDITION on the DAY OF SURGERY/PROCEDURE    I. Updates to Patient's Condition (to be completed by a provider privileged to complete a H&P, following reassessment of the patient by the provider):    Full H&P done today; no updates needed.    II. Procedure Readiness   I have reviewed the patient's H&P and updated condition. By completing and signing this form, I attest that this patient is ready for surgery/procedure.      III. Attestation   I have reviewed the updated information regarding the patient's condition and it is appropriate to proceed with the planned surgery/procedure.    Roanna Raider, MD as of 11:14 AM 10/31/2017

## 2017-11-01 ENCOUNTER — Telehealth: Payer: Self-pay

## 2017-11-01 NOTE — Telephone Encounter (Signed)
Post Procedure call back: Voice mail left with call back number [phone] for any questions or concerns from previous day's sedation procedure.

## 2017-11-20 ENCOUNTER — Telehealth: Payer: Self-pay | Admitting: Primary Care

## 2017-11-20 NOTE — Telephone Encounter (Signed)
Please call patient and have her schedule an appointment with Dr. Merrilyn Puma to discuss any medication changes. Thank you

## 2017-11-20 NOTE — Telephone Encounter (Signed)
atenolol-chlorthalidone (TENORETIC 50) 50-25 MG per tablet      Patient called said she received a letter from [facility] stated that the above medication is going to be changing to a tier 3 and will be to expensive. She said she also doesn't like how the medication makes her feel and wanted to ask Dr. Raliegh Ip if she can stop taking it. Please call patient back to discuss. JMA

## 2017-11-21 ENCOUNTER — Encounter: Payer: Self-pay | Admitting: Primary Care

## 2017-11-21 ENCOUNTER — Ambulatory Visit: Payer: Medicare (Managed Care) | Attending: Primary Care | Admitting: Primary Care

## 2017-11-21 VITALS — BP 118/70 | HR 72 | Ht 62.99 in | Wt 145.1 lb

## 2017-11-21 DIAGNOSIS — J309 Allergic rhinitis, unspecified: Secondary | ICD-10-CM

## 2017-11-21 DIAGNOSIS — M545 Low back pain, unspecified: Secondary | ICD-10-CM

## 2017-11-21 DIAGNOSIS — I10 Essential (primary) hypertension: Secondary | ICD-10-CM

## 2017-11-21 MED ORDER — CHLORTHALIDONE 25 MG PO TABS *I*
25.0000 mg | ORAL_TABLET | Freq: Every morning | ORAL | 1 refills | Status: DC
Start: 2017-11-21 — End: 2018-01-30

## 2017-11-21 NOTE — Progress Notes (Signed)
[name] is 77 y.o. female presenting for follow up.  Per nursing note, Chief Complaint: Other (Possible BP med change)    77 y.o. female with hx of HTN, AAA, Osteoporosis; Hyperlipidemia; GERD, Back pain; Gallstones; Benign neoplasm of colon;     called GI 09/25/2017 to attempt to move up.  spoke with Caryl Pina   Sending  to [facility]. High priority specialist. For sooner appointments.    Last Visit in this Department: 09/25/17  Today notes:   colonoscopy 10/31/17  No polyps or cancer. Mild diverticulosis throughout. Small   internal hemorrhoids  -------  Allergies  Mid back pain, usually > L than R this AM both  AA check 6 m.     PROBL:  Patient Active Problem List    Diagnosis Date Noted    Essential hypertension 06/11/2016     Priority: High    Osteoporosis 10/15/2013     Priority: High     Fosamax started 2015?      Low back pain 10/15/2013     Priority: High    Benign neoplasm of colon 11/03/2013     Priority: Medium     Removed via colonscopy 2012; told to repeat 2017       AAA (abdominal aortic aneurysm) 10/15/2013     Priority: Medium     Due for repeat U/S 09/2016 ; Noted 06/02/13: 3.8X3.5 cm partially thrombosed; 03/2016 stable at 4.77 cm- continued surveillance with vascular surgery                Hyperlipidemia 10/15/2013     Priority: Medium    Wears dentures 06/11/2016     Priority: Low    Patient has healthcare proxy 06/11/2016     Priority: Low     Carola Rhine (son), Wallene Huh (sister)       Gallstones 10/23/2013     Priority: Low     Noted U/S: 06/02/13: There is gallbladder sludge/crushed gallstones. No gallbladder wall thickening, no sonographic Murphy's sign. Common bile duct 75mm.       GERD (gastroesophageal reflux disease) 10/15/2013     Priority: Low    Allergic rhinitis 11/21/2017    Positive colorectal cancer screening using Cologuard test 09/25/2017    Claustrophobia 09/25/2017    AAA (abdominal aortic aneurysm) without rupture - s/p EVAR & Right CFA endarterectomy 11/14/16  10/26/2016     CURRENTMEDS  Current Outpatient Medications   Medication Sig    alendronate (FOSAMAX) 70 MG tablet Take 1 tablet (70 mg total) by mouth every 7 days   Take with a full glass of water. Do not eat or lie down for 30 min.    atenolol-chlorthalidone (TENORETIC 50) 50-25 MG per tablet 1/2 tablet daily    simvastatin (ZOCOR) 20 MG tablet Take 1 tablet (20 mg total) by mouth daily (with dinner)    tiZANidine (ZANAFLEX) 4 MG tablet Take 1 tablet (4 mg total) by mouth every 6-8 hours as needed for muscle pain    losartan (COZAAR) 50 MG tablet Take 1 tablet (50 mg total) by mouth daily    loratadine (CLARITIN) 10 MG tablet Take 10 mg by mouth daily    Calcium Carbonate-Vitamin D (CALCIUM 500 + D PO) Take 1 tablet by mouth 2 times daily    aspirin 81 MG tablet Take 1 tablet (81 mg total) by mouth daily    amLODIPine (NORVASC) 5 MG tablet TAKE 1 TABLET BY MOUTH EVERY DAY    chlorthalidone (HYGROTEN) 25  MG tablet Take 1 tablet (25 mg total) by mouth every morning       Allergies   Allergen Reactions    Oxycodone Nausea And Vomiting     When in hospital for AAA repair    Lipitor [Atorvastatin] Other (See Comments)     Elevated LFTs     SURGICALHX  Past Surgical History:   Procedure Laterality Date    cataract repair Bilateral     COLONOSCOPY  2011    HYSTERECTOMY      left ovaries in place     TUBAL LIGATION       SOC  Social History     Socioeconomic History    Marital status: Divorced     Spouse name: Not on file    Number of children: Not on file    Years of education: Not on file    Highest education level: Not on file   Occupational History    Not on file   Tobacco Use    Smoking status: Former Smoker     Packs/day: 1.00     Years: 30.00     Pack years: 30.00     Types: Cigarettes     Last attempt to quit: 06/15/2013     Years since quitting: 4.5    Smokeless tobacco: Never Used   Substance and Sexual Activity    Alcohol use: Yes     Alcohol/week: 9.0 standard drinks     Types: 9 Glasses  of wine per week    Drug use: No    Sexual activity: Not Currently   Social History Narrative    Lives alone currently in townhome     2 brothers and 2 sisters that live here    2 boys ([place], [place])    Grandchildren          Family History: see Family Comments in the [employer] Panel.     Review of Systems   General: No fever or chills, no significant weight loss.   HEENT: No headaches, change in vision, throat pain, hoarseness.  Pulm: No SOB/Wheezing.  Cardiac: No chest pain, palpitations, syncope or pre-syncope, or lower extremity edema.   GI: no abdominal pain, diarrhea, constipation, melena, or blood per rectum.  GU: no dysuria, polyuria, or hematuria.     Physical Exam     BP 118/70    Pulse 72    Ht 1.6 m (5' 2.99")    Wt 65.8 kg (145 lb 1.6 oz)    BMI 25.71 kg/m    BP Readings from Last 20 Encounters:   11/21/17 118/70   10/31/17 98/51   09/25/17 126/78   07/12/17 124/82   06/21/17 124/70   06/14/17 108/60   03/08/17 136/74   02/15/17 128/74   02/08/17 (!) 160/92   01/29/17 (!) 148/98   01/22/17 170/90   12/14/16 130/80   11/29/16 128/86   11/16/16 131/60   11/05/16 143/69   10/26/16 108/60   10/23/16 134/86   10/03/16 (!) 140/100   09/21/16 140/70   09/07/16 142/90         GEN: Alert, pleasant well adult in NAD.   HEENT: Normocephalic and atraumatic. PERRL. EOMI. Conjunctiva not pale.  Ears: TMs WNL.    Mouth: No pharyngeal lesions, erythema or exudate. Moist mucous membranes.  NECK: Supple, no lymphadenopathy or thyromegaly or JVD.   PULM: Clear to auscultation bilaterally.   CVS: RRR, no murmur, normal S1& S2.   ABD: Soft. Normal  bowel sounds. No hepatosplenomegaly, masses, tenderness.  No rebound tenderness.    EXTREMITIES: No cyanosis, clubbing or edema.  SKIN: No rashes or concerning lesions.      ASSESSMENT/PLAN- For this encounter:  (See also Comprehensive Assessment/Plan below)    1. Essential hypertension  chlorthalidone (HYGROTEN) 25 MG tablet   2. Low back pain     3. Allergic rhinitis        Stop atenolol chlorthalidone combo  Replace with the chlorthalidone 25 mg alone.    Comprehensive Assessment/Plan-  Pulled forward. May include some old information.     Abd pain/Bloatings/Loose stools/+Cologuard-   referred to GI 07/22/17 and colonoscopy scheduled for 01/29/17, but needs to be done sooner.  With her positive cologuard, and new left lower quadrant abd pain, bloating, and frequent stools which is new, will refer with an urgent rather than routine status.  colonoscopy 10/31/17, neg.     R upper lumbar pain:   Try tramadol if not gradually improving, could call and referral to pain clinic.   She would like to repeat the injections that worked so well in [place] 8-9 years ago by her rheumatologist Dr Harrington Challenger, but Dr Karle Starch apparently wanted to do an MRI first and she has severe claustrophobia even with open MRI. In [place].  09/25/2017 will see if can get the new even more open MRI.   Claustrophobia- unable to do standard or open MRI in [place].   Willing to give a benzo before open MRI  R meralgia paresthetica- Started with procedure 11/14/16, presumably due to positioning.   Neuropathic pain- Gaba 300 mg trial 2-2---> even 2-2-2  Toe pain- XRay.   AAA- Vascular surgery-Kathleen Raman.   EVAR & Right CFA endarterectomy 11/14/16      CTA Due 12/20/17.   Osteoporosis- Dexa showed T score -3.3 12/10/13, ordered by Dr Ruthy Dick.   Fosamax 70 mgweekly. To take 5 yrs and the consider stopping for a holiday.   Ca Vit D 500/200 BID  Check Vit D. 79 (09/27/16).   Could consider osteoporosis eval, Durwin Reges, M.D.et al  HTN-       Atenolol cholorthalidone 50/25 daily --> chlorthalidone alone 25 mg daily      Losartan- 50 mg daily   Hyperlipidemia-       simvastatin 20mg  daily. Past 40. Side effects with Atrorva and stopped. Increase back to 40.   Recheck FLP before next visit. LDL 97 (06/17/17).   GERD- Past omeprazole 20 mg BID. Off. No further heartburn.  Belching- Gavascon PRN. helps.    Allergies: loratadine 10 mg daily    Low Back pain- R sided sciatica.  MRI- 11/20/00 [facility]. L5-S1 right foraminal and far lateral disc protrusion/herniation that touches the existing right L5 nerve root. There is moderate right foraminal and mild left foraminal stenosis.  01/26/15: Increase gaba 300 mg, to1 in the AM, 2 at bedtime. 300mg  bid .   LS Spine XR 12/22/15- disc space narrowing at all levels.   11/29/16: Try tramadol if not gradually improving, could call and referral to pain clinic.  She would like to repeat the injections that worked so well in [place] 8-9 years ago by her rheumatologist Dr Harrington Challenger, but Dr Karle Starch apparently wanted to do an MRI first and she has severe claustrophobia even with open MRI.   Gallstones- asymptomatic.   Benign neoplasm of colon- repeat colonoscopy due 2017.   Constipation- improved with magnesium supplement.   Aspirin 81 mg daily   Health  Maintenance-   --Lipids: LDL 137 04/15/14.   --CRC Screening: 2012. colonoscopy 10/31/17, eg.   [place] Dr Bearl Mulberry, [facility] or [place].         One of offices phone: [phone]    Female:   Pap:   Mammo: 12/10/13. 08/27/16    Immunizations:  --Tdap: 06/11/2016  --Flu: 11/04/13, 2016 [facility] Gaspar Garbe. 11/05/16, got 10/08/2017,  --Shingles:   Shingrix- discussed 11/29/2016. Can't afford, would cost $167.   --Prevnar: 10/15/2013  --Pneumovax: 12/10/2014 [facility]. (changing). Preferred [facility]     Consultants:   Vascular surgery- 08/27/14 Cathren Harsh.   Repair 11/14/16. FU due 12/14/16.  Ortho\Spinal Ortho- Dr Karle Starch.     Return in about 3 months (around 02/21/2018) for HTN etc.     Patient Instructions     Stop atenolol chlorthalidone combo  Replace with the chlorthalidone 25 mg alone..    BP Readings from Last 20 Encounters:   11/21/17 118/70   10/31/17 98/51   09/25/17 126/78   07/12/17 124/82   06/21/17 124/70   06/14/17 108/60   03/08/17 136/74   02/15/17 128/74   02/08/17 (!) 160/92    01/29/17 (!) 148/98   01/22/17 170/90   12/14/16 130/80   11/29/16 128/86   11/16/16 131/60   11/05/16 143/69   10/26/16 108/60   10/23/16 134/86   10/03/16 (!) 140/100   09/21/16 140/70   09/07/16 142/90       Allergic Rhinitis:  Plain Non Sedating Antihistamines:      Loratadine (Claritin)- 10 mg daily or      cetirazine (Zyrtec)- 10 mg daily       ----  If not adequate relief, would add   Nasal Steroid Spray:  Fluticasone (Flonase) nasal spray or alternative.     Mast Cell Stablizers:(Block release of histamine in the 1st place):      montelukast (Singulair) 10mg  at bedtime.     Pilar Plate, MD

## 2017-11-21 NOTE — Patient Instructions (Addendum)
Stop atenolol chlorthalidone combo  Replace with the chlorthalidone 25 mg alone..    BP Readings from Last 20 Encounters:   11/21/17 118/70   10/31/17 98/51   09/25/17 126/78   07/12/17 124/82   06/21/17 124/70   06/14/17 108/60   03/08/17 136/74   02/15/17 128/74   02/08/17 (!) 160/92   01/29/17 (!) 148/98   01/22/17 170/90   12/14/16 130/80   11/29/16 128/86   11/16/16 131/60   11/05/16 143/69   10/26/16 108/60   10/23/16 134/86   10/03/16 (!) 140/100   09/21/16 140/70   09/07/16 142/90       Allergic Rhinitis:  Plain Non Sedating Antihistamines:      Loratadine (Claritin)- 10 mg daily or      cetirazine (Zyrtec)- 10 mg daily       ----  If not adequate relief, would add   Nasal Steroid Spray:  Fluticasone (Flonase) nasal spray or alternative.     Mast Cell Stablizers:(Block release of histamine in the 1st place):      montelukast (Singulair) 10mg  at bedtime.

## 2017-12-01 ENCOUNTER — Other Ambulatory Visit: Payer: Self-pay | Admitting: Vascular Surgery

## 2017-12-01 DIAGNOSIS — I714 Abdominal aortic aneurysm, without rupture, unspecified: Secondary | ICD-10-CM

## 2017-12-09 ENCOUNTER — Other Ambulatory Visit: Payer: Self-pay | Admitting: Primary Care

## 2017-12-09 DIAGNOSIS — I10 Essential (primary) hypertension: Secondary | ICD-10-CM

## 2017-12-10 ENCOUNTER — Other Ambulatory Visit
Admission: RE | Admit: 2017-12-10 | Discharge: 2017-12-10 | Disposition: A | Discharge status: Home / Self Care | Payer: Medicare (Managed Care) | Source: Ambulatory Visit | Attending: Vascular Surgery | Admitting: Vascular Surgery

## 2017-12-10 DIAGNOSIS — I714 Abdominal aortic aneurysm, without rupture, unspecified: Secondary | ICD-10-CM

## 2017-12-10 LAB — BUN: Lab: 21 mg/dL — ABNORMAL HIGH (ref 6–20)

## 2017-12-10 LAB — CREATININE, SERUM
Creatinine: 1.08 mg/dL — ABNORMAL HIGH (ref 0.51–0.95)
GFR,Black: 57 * — AB
GFR,Caucasian: 49 * — AB

## 2017-12-20 ENCOUNTER — Ambulatory Visit
Admission: RE | Admit: 2017-12-20 | Discharge: 2017-12-20 | Disposition: A | Discharge status: Home / Self Care | Payer: Medicare (Managed Care) | Source: Ambulatory Visit | Attending: Vascular Surgery | Admitting: Vascular Surgery

## 2017-12-20 ENCOUNTER — Ambulatory Visit: Payer: Medicare (Managed Care) | Attending: Surgery | Admitting: Surgery

## 2017-12-20 VITALS — BP 118/80 | Ht 64.0 in | Wt 143.0 lb

## 2017-12-20 DIAGNOSIS — Z9889 Other specified postprocedural states: Secondary | ICD-10-CM

## 2017-12-20 DIAGNOSIS — I701 Atherosclerosis of renal artery: Secondary | ICD-10-CM | POA: Insufficient documentation

## 2017-12-20 DIAGNOSIS — I714 Abdominal aortic aneurysm, without rupture: Secondary | ICD-10-CM | POA: Insufficient documentation

## 2017-12-20 DIAGNOSIS — I728 Aneurysm of other specified arteries: Secondary | ICD-10-CM | POA: Insufficient documentation

## 2017-12-20 DIAGNOSIS — Z8679 Personal history of other diseases of the circulatory system: Secondary | ICD-10-CM

## 2017-12-20 MED ORDER — IOHEXOL 350 MG/ML (OMNIPAQUE) IV SOLN *I*
1.0000 mL | Freq: Once | INTRAVENOUS | Status: AC
Start: 2017-12-20 — End: 2017-12-20
  Administered 2017-12-20: 97 mL via INTRAVENOUS

## 2017-12-20 NOTE — Progress Notes (Signed)
Vascular Surgery Outpatient Clinic Follow-Up Visit    HPI:     [name] is a 77 y.o. female with PMH significant for HLD, chronic back pain, Sciatica, herniated discs, GERD and smoking history: quit in 2015 who was seen in the Vascular surgery outpatient clinic for routine follow-up.  [name] underwent an EVAR with a right femoral endarterectomy and bovine pericardial patch angioplasty in November 2018 for 5.1 cm AAA.      [name] denies any abdominal or back pain of unknown etiology. She reports 20 lbs weight gain since surgery along with symptoms of fatigue. She has followed up with her PCP regarding symptoms. She denies and post prandial discomfort, claudication symptoms, ischemic rest pain or tissue loss of her lower extremities.       REVIEW OF SYSTEMS  ROS - pertinent items reviewed in HPI    MEDICAL HISTORY  Past Medical History:   Diagnosis Date    AAA (abdominal aortic aneurysm)     Elevated LFTs     Unclear if this was related to statin, gallstones, alcohol consumption     Essential hypertension 06/11/2016    GERD (gastroesophageal reflux disease)     High blood pressure     HLD (hyperlipidemia)     Hx of colonic polyps     Colonscopy 08/11/10; repeat 2017 per GI     Lumbar disc herniation 2006    L5-S1    Osteoporosis     Spondylosis     Urinary retention        SURGICAL HISTORY  Past Surgical History:   Procedure Laterality Date    cataract repair Bilateral     COLONOSCOPY  2011    HYSTERECTOMY      left ovaries in place     TUBAL LIGATION         FAMILY HISTORY  Family History   Problem Relation Age of Onset    Dementia Mother 42    Cancer Mother         lung    High cholesterol Mother     Stroke Mother     Parkinsonism Father     Heart Disease Father 57        passed    High cholesterol Father     Heart attack Father     Cancer Sister         Breast; in recovery    High cholesterol Sister     Hip fracture Sister     Breast cancer Sister 77    Cancer Brother         prostate     High cholesterol Brother     Diabetes Brother     Aneurysm Neg Hx     Anesthesia problems Neg Hx     Colon cancer Neg Hx     Colon polyps Neg Hx         SOCIAL HISTORY   reports that she quit smoking about 4 years ago. Her smoking use included cigarettes. She has a 30.00 pack-year smoking history. She has never used smokeless tobacco. She reports current alcohol use of about 9.0 standard drinks of alcohol per week. She reports that she does not use drugs.     ALLERGIES  Oxycodone and Lipitor [atorvastatin]     MEDICATIONS  Current Outpatient Medications   Medication Sig    amLODIPine (NORVASC) 5 MG tablet TAKE 1 TABLET BY MOUTH EVERY DAY    chlorthalidone (HYGROTEN) 25 MG tablet Take 1  tablet (25 mg total) by mouth every morning    alendronate (FOSAMAX) 70 MG tablet Take 1 tablet (70 mg total) by mouth every 7 days   Take with a full glass of water. Do not eat or lie down for 30 min.    atenolol-chlorthalidone (TENORETIC 50) 50-25 MG per tablet 1/2 tablet daily    simvastatin (ZOCOR) 20 MG tablet Take 1 tablet (20 mg total) by mouth daily (with dinner)    tiZANidine (ZANAFLEX) 4 MG tablet Take 1 tablet (4 mg total) by mouth every 6-8 hours as needed for muscle pain    losartan (COZAAR) 50 MG tablet Take 1 tablet (50 mg total) by mouth daily    loratadine (CLARITIN) 10 MG tablet Take 10 mg by mouth daily    Calcium Carbonate-Vitamin D (CALCIUM 500 + D PO) Take 1 tablet by mouth 2 times daily    aspirin 81 MG tablet Take 1 tablet (81 mg total) by mouth daily     No current facility-administered medications for this visit.         Objective:      Vitals:    12/20/17 1301   BP: 118/80   Weight:    Height:      Imaging:  CTA reviewed with Dr. Melony Overly  Infrarenal aortic aneurysm status post aortobiiliac endograft stent. 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 However, the aneurysm sac is decreased in size.      Right renal atrophy and delayed nephrogram with right renal artery stenosis,  similar to prior exam.      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      END OF IMPRESSION         Physical Exam:      General appearance: healthy, alert, active, no distress and cooperative  HEENT: sclera clear, anicteric and neck supple, no carotid bruit  CV: regular rate and rhythm  Lungs: clear  Abd: soft, non-tender  Extremities:  Warm, well perfused; no cyanosis, ischemic changes or edema, palpable pedal pulses bilaterally   Neuro: normal without focal findings, mental status, speech normal, alert and oriented x3 and gait and station normal    Assessment:   [name] is a 77 y.o. female with history of 5.1 cm AAA s/p EVAR.       Plan:      Discussed CT findings with patient- patent repair without endoleak or migration    Follow-up in 1 year with AAA imaging and visit   Advised to call sooner with any issues in the interim     Mack Guise, [place]  8:21 PM

## 2018-01-13 ENCOUNTER — Other Ambulatory Visit: Payer: Self-pay | Admitting: Primary Care

## 2018-01-13 DIAGNOSIS — E7800 Pure hypercholesterolemia, unspecified: Secondary | ICD-10-CM

## 2018-01-13 MED ORDER — SIMVASTATIN 20 MG PO TABS *I*
20.0000 mg | ORAL_TABLET | Freq: Every day | ORAL | 1 refills | Status: DC
Start: 2018-01-13 — End: 2018-06-16

## 2018-01-13 NOTE — Telephone Encounter (Signed)
LOV 11/21/17  NOV none

## 2018-01-29 ENCOUNTER — Other Ambulatory Visit: Payer: Medicare (Managed Care) | Admitting: Gastroenterology

## 2018-01-30 ENCOUNTER — Encounter: Payer: Medicare (Managed Care) | Admitting: Primary Care

## 2018-01-30 DIAGNOSIS — I10 Essential (primary) hypertension: Secondary | ICD-10-CM

## 2018-01-30 MED ORDER — HYDROCHLOROTHIAZIDE 25 MG PO TABS *I*
25.0000 mg | ORAL_TABLET | Freq: Every morning | ORAL | 1 refills | Status: DC
Start: 2018-01-30 — End: 2018-06-23

## 2018-01-30 NOTE — Telephone Encounter (Signed)
Due to cost, will switch to hydrochlorothiazide.

## 2018-03-13 ENCOUNTER — Other Ambulatory Visit: Payer: Self-pay | Admitting: Primary Care

## 2018-03-13 DIAGNOSIS — M810 Age-related osteoporosis without current pathological fracture: Secondary | ICD-10-CM

## 2018-03-27 ENCOUNTER — Other Ambulatory Visit: Payer: Self-pay | Admitting: Primary Care

## 2018-03-27 DIAGNOSIS — I10 Essential (primary) hypertension: Secondary | ICD-10-CM

## 2018-03-27 DIAGNOSIS — R030 Elevated blood-pressure reading, without diagnosis of hypertension: Secondary | ICD-10-CM

## 2018-06-14 ENCOUNTER — Other Ambulatory Visit: Payer: Self-pay | Admitting: Primary Care

## 2018-06-14 DIAGNOSIS — E7800 Pure hypercholesterolemia, unspecified: Secondary | ICD-10-CM

## 2018-06-16 ENCOUNTER — Encounter: Payer: Self-pay | Admitting: Primary Care

## 2018-06-16 NOTE — Telephone Encounter (Signed)
Patient scheduled a appointment to see doug this week. JMA

## 2018-06-16 NOTE — Telephone Encounter (Signed)
Error

## 2018-06-17 ENCOUNTER — Encounter: Payer: Self-pay | Admitting: Family Medicine

## 2018-06-17 ENCOUNTER — Ambulatory Visit: Payer: Medicare (Managed Care) | Admitting: Family Medicine

## 2018-06-17 VITALS — BP 146/92 | HR 71 | Temp 98.2°F | Ht 64.0 in | Wt 145.0 lb

## 2018-06-17 DIAGNOSIS — M545 Low back pain, unspecified: Secondary | ICD-10-CM

## 2018-06-17 DIAGNOSIS — J309 Allergic rhinitis, unspecified: Secondary | ICD-10-CM

## 2018-06-17 DIAGNOSIS — M810 Age-related osteoporosis without current pathological fracture: Secondary | ICD-10-CM

## 2018-06-17 DIAGNOSIS — I10 Essential (primary) hypertension: Secondary | ICD-10-CM

## 2018-06-17 MED ORDER — CELECOXIB 100 MG PO CAPS *I*
100.0000 mg | ORAL_CAPSULE | Freq: Two times a day (BID) | ORAL | 0 refills | Status: DC | PRN
Start: 2018-06-17 — End: 2018-08-04

## 2018-06-17 MED ORDER — FLUTICASONE PROPIONATE 50 MCG/ACT NA SUSP *I*
1.0000 | Freq: Every day | NASAL | 5 refills | Status: DC
Start: 2018-06-17 — End: 2018-12-22

## 2018-06-17 NOTE — Progress Notes (Signed)
Doing good job on her subjective like this 1 patient ID:  [name]  1940-05-08    Subjective:    CC:  Hypertension (BP elevated due to back pain); Back Pain (chronic pain, getting worse ~~8/10 now, wants open MRI); and Medication Problem/Question (discuss going off fosamax)    HPI    78 year old female with a pmh of AAA, GERD, HTN, HLD, osteoporosis, and back pain who presents to the office with complaints of increasing back pain that radiates down her legs with hypertension that has progressively gotten worse over the last year. Patient has concerns about decreased levels of physical activity and unable to participate in activities she enjoys, such as gardening and walking. As a result, patient states that she has gained weight.  Including here in [place].  The patient's been medically doing like this     Patient is been using over-the-counter NSAIDs along with Tylenol for pain control and is developing some epigastric discomfort.     Dexa scan performed almost 2 years ago with a finding of signifivcant osteoporosis. Patient is hesitant to undergo MRI which was suggested from a past visit with an orthopedist, due to claustrophobia. She is not currently taking any pain medicine to manage the back pain.  Is that     [name] has a current medication list which includes the following prescription(s): simvastatin, amlodipine, losartan, alendronate, hydrochlorothiazide, tizanidine, calcium carbonate-vitamin d, aspirin, fluticasone, celecoxib, and loratadine.  [name] is allergic to oxycodone and lipitor [atorvastatin].      Medications and allergies were reviewed and reconciled with the patient in eRecord today; and No changes were made.      ROS      Objective:    Vitals:    06/17/18 1058   BP: (!) 146/92   BP Location: Left arm   Patient Position: Sitting   Cuff Size: adult   Pulse: 71   Temp: 36.8 C (98.2 F)   SpO2: 97%   Weight: 65.8 kg (145 lb)   Height: 1.626 m (5\' 4" )     Physical Exam   Constitutional: She is  oriented to person, place, and time and well-developed, well-nourished, and in no distress.   Eyes: Pupils are equal, round, and reactive to light.   Neck: Normal range of motion.   Cardiovascular: Normal rate and regular rhythm.   Pulmonary/Chest: Effort normal.   Musculoskeletal: Normal range of motion.      Comments: Patient sits comfortably she transfers sitting to standing is a little slow transferring standing to sitting on the exam table because of some right leg weakness.  She has no central tenderness but feels tender in the paraspinous areas of her lower thoracic and upper lumbar area.    Neurological: She is alert and oriented to person, place, and time.   Reflexes are 1+ and bilaterally equal   Skin: Skin is warm and dry.   Psychiatric: Mood, affect and judgment normal.       No results found for this or any previous visit (from the past 24 hour(s)).    Assessment/Plan:  [name] was seen today for hypertension, back pain and medication problem/question.    Diagnoses and all orders for this visit:    Low back pain  -     celecoxib (CELEBREX) 100 MG capsule; Take 1 capsule (100 mg total) by mouth 2 times daily as needed for Pain (take as needed for back pain)    Cox 1 specific NSAID chosen as patient  is having gastrointestinal symptoms related to use of over-the-counter NSAIDs.    Osteoporosis, unspecified osteoporosis type, unspecified pathological fracture presence  -     DEXA Scan - Radius; Future  -     AMB REFERRAL TO ORTHOPEDIC SURGERY    Allergic rhinitis  -     fluticasone (FLONASE) 50 MCG/ACT nasal spray; 1 spray by Nasal route daily    Essential hypertension    Patient significant increase in mid back pain makes me concerned that she may have a new onset of compression fracture because of her fairly significant osteoporosis.Marland Kitchen    Referred to orthopedics for specific evaluation of any progression of her osteoporosis.    Patient's been on Fosamax for 5 years also interested in assessment of whether  she would be better served with an alternative regimen for her osteoporosis.  So also to point this picture what I would do in this note is because we chose Celebrex but I would supporting appear    No follow-ups on file.    Romualdo Bolk FNP-C

## 2018-06-23 ENCOUNTER — Other Ambulatory Visit: Payer: Self-pay | Admitting: Primary Care

## 2018-06-23 DIAGNOSIS — I10 Essential (primary) hypertension: Secondary | ICD-10-CM

## 2018-06-23 NOTE — Telephone Encounter (Signed)
Sent Rx for HCTZ, but she is due for followup monitoring lab which I ordered.

## 2018-06-23 NOTE — Addendum Note (Signed)
Addended by: Pilar Plate on: 06/23/2018 06:40 PM     Modules accepted: Orders

## 2018-06-25 ENCOUNTER — Other Ambulatory Visit: Payer: Self-pay | Admitting: Family Medicine

## 2018-06-25 DIAGNOSIS — J019 Acute sinusitis, unspecified: Secondary | ICD-10-CM

## 2018-07-01 ENCOUNTER — Ambulatory Visit: Payer: Medicare (Managed Care) | Admitting: Family Medicine

## 2018-07-01 ENCOUNTER — Encounter: Payer: Self-pay | Admitting: Family Medicine

## 2018-07-01 DIAGNOSIS — Z0000 Encounter for general adult medical examination without abnormal findings: Secondary | ICD-10-CM

## 2018-07-14 ENCOUNTER — Other Ambulatory Visit: Payer: Self-pay | Admitting: Family Medicine

## 2018-07-14 DIAGNOSIS — M810 Age-related osteoporosis without current pathological fracture: Secondary | ICD-10-CM

## 2018-07-14 DIAGNOSIS — J019 Acute sinusitis, unspecified: Secondary | ICD-10-CM

## 2018-07-17 ENCOUNTER — Other Ambulatory Visit
Admission: RE | Admit: 2018-07-17 | Discharge: 2018-07-17 | Disposition: A | Discharge status: Home / Self Care | Payer: Medicare (Managed Care) | Source: Ambulatory Visit | Attending: Primary Care | Admitting: Primary Care

## 2018-07-17 DIAGNOSIS — I10 Essential (primary) hypertension: Secondary | ICD-10-CM | POA: Insufficient documentation

## 2018-07-17 DIAGNOSIS — Z0000 Encounter for general adult medical examination without abnormal findings: Secondary | ICD-10-CM | POA: Insufficient documentation

## 2018-07-17 LAB — VITAMIN D: 25-OH Vit Total: 52 ng/mL (ref 30–60)

## 2018-07-17 LAB — COMPREHENSIVE METABOLIC PANEL
ALT: 13 U/L (ref 0–35)
AST: 23 U/L (ref 0–35)
Albumin: 4.9 g/dL (ref 3.5–5.2)
Alk Phos: 72 U/L (ref 35–105)
Anion Gap: 12 (ref 7–16)
Bilirubin,Total: 0.4 mg/dL (ref 0.0–1.2)
CO2: 29 mmol/L — ABNORMAL HIGH (ref 20–28)
Calcium: 9.9 mg/dL (ref 8.6–10.2)
Chloride: 99 mmol/L (ref 96–108)
Creatinine: 1.11 mg/dL — ABNORMAL HIGH (ref 0.51–0.95)
GFR,Black: 55 * — AB
GFR,Caucasian: 47 * — AB
Glucose: 96 mg/dL (ref 60–99)
Lab: 13 mg/dL (ref 6–20)
Potassium: 4.4 mmol/L (ref 3.3–5.1)
Sodium: 140 mmol/L (ref 133–145)
Total Protein: 7.2 g/dL (ref 6.3–7.7)

## 2018-07-29 ENCOUNTER — Telehealth: Payer: Self-pay | Admitting: Primary Care

## 2018-07-29 DIAGNOSIS — Z13820 Encounter for screening for osteoporosis: Secondary | ICD-10-CM

## 2018-07-29 NOTE — Telephone Encounter (Signed)
Patient is scheduled for a dexa topmorrow.  Juliann Pulse, from ortho, called stating the order placed by [name] is a dexa scan -radius.  Juliann Pulse is wondering if the wrong order was placed?  Or if Doug did, indeed, want a dexa scan radius? Can a new order be placed?

## 2018-07-30 ENCOUNTER — Encounter: Payer: Self-pay | Admitting: Orthopedic Surgery

## 2018-07-30 ENCOUNTER — Ambulatory Visit: Payer: Medicare (Managed Care)

## 2018-07-30 ENCOUNTER — Ambulatory Visit: Payer: Medicare (Managed Care) | Admitting: Orthopedic Surgery

## 2018-07-30 VITALS — BP 153/68 | HR 72 | Wt 141.0 lb

## 2018-07-30 DIAGNOSIS — Z13820 Encounter for screening for osteoporosis: Secondary | ICD-10-CM

## 2018-07-30 DIAGNOSIS — M810 Age-related osteoporosis without current pathological fracture: Secondary | ICD-10-CM

## 2018-07-30 DIAGNOSIS — Z780 Asymptomatic menopausal state: Secondary | ICD-10-CM

## 2018-07-30 NOTE — Progress Notes (Signed)
CC: Osteoporosis- new patient evaluation     [name] is a 78 y.o. female new patient referred to [place] for osteoporosis evaluation.     Medication history:  Alendronate Aug 2015- present, tolerating well      Fracture history:   none    Servings of calcium in daily diet:   Green salad 5x/week    Calcium supplement:  600mg  BID     Vitamin D supplement:   500 units daily     Weightbearing exercise:   Does her own strengthening exercises for at least 10 mins a day. Tries to walk around her complex but only can walk 5-10 mins before experiencing shooting pain down the leg.      Of systemic steroids, anticonvulsants, lasix and PPIs, pt takes:   none    PMH:   Past Medical History:   Diagnosis Date    AAA (abdominal aortic aneurysm)     Elevated LFTs     Unclear if this was related to statin, gallstones, alcohol consumption     Essential hypertension 06/11/2016    GERD (gastroesophageal reflux disease)     High blood pressure     HLD (hyperlipidemia)     Hx of colonic polyps     Colonscopy 08/11/10; repeat 2017 per GI     Lumbar disc herniation 2006    L5-S1    Osteoporosis     Spondylosis     Urinary retention        PSH:   Past Surgical History:   Procedure Laterality Date    cataract repair Bilateral     COLONOSCOPY  2011    HYSTERECTOMY      left ovaries in place     TUBAL LIGATION         Soc Hx:   Social History     Socioeconomic History    Marital status: Divorced     Spouse name: Not on file    Number of children: Not on file    Years of education: Not on file    Highest education level: Not on file   Tobacco Use    Smoking status: Former Smoker     Packs/day: 1.00     Years: 30.00     Pack years: 30.00     Types: Cigarettes     Last attempt to quit: 06/15/2013     Years since quitting: 5.1    Smokeless tobacco: Never Used   Substance and Sexual Activity    Alcohol use: Yes     Alcohol/week: 9.0 standard drinks     Types: 9 Glasses of wine per week    Drug use: No    Sexual activity: Not  Currently   Other Topics Concern    Special Diet Not Asked    Exercise Yes    Weight Concern Yes    Caffeine Concern Not Asked   Social History Narrative    Lives alone currently in townhome     2 brothers and 2 sisters that live here    2 boys ([place], [place])    Grandchildren        Fam Hx:   Family History   Problem Relation Age of Onset    Dementia Mother 21    Cancer Mother         lung    High cholesterol Mother     Stroke Mother     Parkinsonism Father     Heart Disease Father 20  passed    High cholesterol Father     Heart attack Father     Cancer Sister         Breast; in recovery    High cholesterol Sister     Hip fracture Sister     Breast cancer Sister 24    Cancer Brother         prostate    High cholesterol Brother     Diabetes Brother     Aneurysm Neg Hx     Anesthesia problems Neg Hx     Colon cancer Neg Hx     Colon polyps Neg Hx      ROS: Our 12 point ROS is positive for the following sxs: none     Medications:   Current Outpatient Medications:     hydroCHLOROthiazide (HYDRODIURIL) 25 MG tablet, TAKE 1 TABLET BY MOUTH EVERY MORNING, Disp: 90 tablet, Rfl: 0    fluticasone (FLONASE) 50 MCG/ACT nasal spray, 1 spray by Nasal route daily, Disp: 16 g, Rfl: 5    celecoxib (CELEBREX) 100 MG capsule, Take 1 capsule (100 mg total) by mouth 2 times daily as needed for Pain (take as needed for back pain), Disp: 60 capsule, Rfl: 0    simvastatin (ZOCOR) 20 MG tablet, TAKE 1 TABLET BY MOUTH EVERY DAY WITH DINNER, Disp: 90 tablet, Rfl: 1    amLODIPine (NORVASC) 5 MG tablet, TAKE 1 TABLET BY MOUTH EVERY DAY, Disp: 90 tablet, Rfl: 1    losartan (COZAAR) 50 MG tablet, TAKE 1 TABLET BY MOUTH EVERY DAY, Disp: 90 tablet, Rfl: 1    alendronate (FOSAMAX) 70 MG tablet, TAKE 1 TABLET BY MOUTH EVERY 7 DAYS. TAKE WITH A GLASS OF WATER. DO NOT EAT OR LIE DOWN FOR 30 MINUTES, Disp: 12 tablet, Rfl: 1    tiZANidine (ZANAFLEX) 4 MG tablet, Take 1 tablet (4 mg total) by mouth every 6-8  hours as needed for muscle pain, Disp: 60 tablet, Rfl: 1    loratadine (CLARITIN) 10 MG tablet, Take 10 mg by mouth daily, Disp: , Rfl:     Calcium Carbonate-Vitamin D (CALCIUM 500 + D PO), Take 1 tablet by mouth 2 times daily, Disp: , Rfl:     aspirin 81 MG tablet, Take 1 tablet (81 mg total) by mouth daily, Disp: 90 tablet, Rfl: 1    Allergies:   Allergies   Allergen Reactions    Oxycodone Nausea And Vomiting     When in hospital for AAA repair    Lipitor [Atorvastatin] Other (See Comments)     Elevated LFTs       DEXA:   Personally reviewed and discussed with patient     Location: [facility]   Date: 07/30/18  Site T- score previous (2018)   Worst spine vertebra Unreliable  Unreliable    Left femoral neck -2.5 -2.8   Right femoral neck -2.8 -2.7   Left total hip -3.0 -3.0   Right total hip  -2.6 -2.7   Right arm -2.5    Labs:       Lab results: 07/17/18  0939   Sodium 140   Potassium 4.4   Chloride 99   CO2 29*   UN 13   Creatinine 1.11*   GFR,Caucasian 47*   GFR,Black 55*   Glucose 96   Calcium 9.9           Lab results: 07/17/18  0939   25-OH Vit Total 52  Physical exam:   Patient is alert and oriented with equal pupils and symmetric facial movements. Spine is midline without kyphosis. Spine and paravertebral muscles are nontender to palpation. Normal ROM in spine. Patient is able to balance on 1 foot for at least 5 seconds. Sclera are without blue tint. Teeth of normal opacity. Fingers of normal length. Gait is normal.     Assessment/plan:   Pt has osteoporosis as diagnosed by DEXA scan. She is coming to the end of her 5 years on fosamax and her bone density has been fairly stable. We discussed that she could start a bone holiday now or we could consider Prolia. She doesn't want Prolia because her insurance copay would be high. That's okay considering she was fairly stable anyway. She will now discontinue fosamax and start a bone holiday. Continue present calcium and vitamin D. Continue  exercise as much as possible. F/u in 1 yr w/ new cal, creat, vit d, ntx.     NEXT DEXA: July 2022 @ [facility] [place]  Osteoporosis Specialist  [facility]

## 2018-08-01 MED ORDER — ALENDRONATE SODIUM 70 MG PO TABS *I*
70.0000 mg | ORAL_TABLET | ORAL | 1 refills | Status: DC
Start: 2018-08-01 — End: 2018-08-04

## 2018-08-04 ENCOUNTER — Encounter: Payer: Self-pay | Admitting: Family Medicine

## 2018-08-04 ENCOUNTER — Ambulatory Visit: Payer: Medicare (Managed Care) | Admitting: Family Medicine

## 2018-08-04 VITALS — BP 142/86 | HR 69 | Temp 97.7°F | Ht 64.02 in | Wt 140.4 lb

## 2018-08-04 DIAGNOSIS — M545 Low back pain, unspecified: Secondary | ICD-10-CM

## 2018-08-04 NOTE — Progress Notes (Signed)
Patient ID:  [name]  March 13, 1940    Subjective:    CC:  Back Pain (2 week fuv)    HPI    Continues Bilater lower back pain restricted to lumbar region bilaterally.  She is not having any radicular symptoms which have been an issue previously    She previously had good benefit from joint space injection.  Rehab back Dr. Lawrence Farmer is interested in MRI prior to doing further injections.  Patient is reluctant to do MRI as it makes her very anxious and closed environment.  She like an MRI by ordered an eye on the open machine which she is a charity care patient and this will not be covered by [employer] care.    [name] has a current medication list which includes the following prescription(s): hydrochlorothiazide, simvastatin, amlodipine, losartan, calcium carbonate-vitamin d, aspirin, and fluticasone.  [name] is allergic to oxycodone and lipitor [atorvastatin].      Medications and allergies were reviewed and reconciled with the patient in eRecord today; and No changes were made.      Review of Systems   Musculoskeletal: Positive for back pain and joint pain.         Objective:    Vitals:    08/04/18 1010   BP: 142/86   Pulse: 69   Temp: 36.5 C (97.7 F)   SpO2: 97%   Weight: 63.7 kg (140 lb 6.4 oz)   Height: 1.626 m (5' 4.02")     Physical Exam   Constitutional: She is oriented to person, place, and time and well-developed, well-nourished, and in no distress.   Eyes: Pupils are equal, round, and reactive to light.   Neck: Normal range of motion.   Cardiovascular: Normal rate and regular rhythm.   Pulmonary/Chest: Effort normal.   Musculoskeletal: Normal range of motion.      Comments: Patient is slow to transfer sitting to standing, generally full range of motion of lumbar spine with pain at endpoints of rotation and forward flexion.  Especially tender at bilateral insertion of paraspinous superficial medial iliac crest bilaterally.    Neurological: She is alert and oriented to person,  place, and time.   Skin: Skin is warm and dry.   Psychiatric: Mood, affect and judgment normal.       No results found for this or any previous visit (from the past 24 hour(s)).    Assessment/Plan:  [name] was seen today for back pain.    Diagnoses and all orders for this visit:    Low back pain    Patient strongly encouraged to continue independence of rehab exercises.  She has quite a few as she is participated with physical therapy in the past.    I gave her some specific exercises to focus on we did some problem-solving about how to achieve these at home independently.    She will follow-up on an as-needed basis if she wants to re-pursue MRI prior to imaging for injections with Dr. Karle Farmer.    Return if symptoms worsen or fail to improve.    Diane Bolk FNP-C

## 2018-08-08 ENCOUNTER — Other Ambulatory Visit
Admission: RE | Admit: 2018-08-08 | Discharge: 2018-08-08 | Disposition: A | Discharge status: Home / Self Care | Payer: Medicare (Managed Care) | Source: Ambulatory Visit | Attending: Orthopedic Surgery | Admitting: Orthopedic Surgery

## 2018-08-08 DIAGNOSIS — M810 Age-related osteoporosis without current pathological fracture: Secondary | ICD-10-CM | POA: Insufficient documentation

## 2018-08-08 LAB — PTH, INTACT: Intact PTH: 22.9 pg/mL (ref 15.0–65.0)

## 2018-08-09 LAB — N-TELOPEPTIDE, URINE
Creat,Ur: 30 mg/dL
NTX Telopeptide: 13

## 2018-08-11 NOTE — Procedures (Signed)
Providers: DXA scan for your review.  Images and report are located under the   imaging (  Scans on order )  and/or the media tab ( under dxa/year )  .   For Mychart active patients, please contact your referring provider for results.

## 2018-08-13 ENCOUNTER — Other Ambulatory Visit: Payer: Self-pay | Admitting: Family Medicine

## 2018-08-13 ENCOUNTER — Encounter: Payer: Self-pay | Admitting: Orthopedic Surgery

## 2018-08-13 ENCOUNTER — Encounter: Payer: Self-pay | Admitting: Family Medicine

## 2018-08-13 DIAGNOSIS — M810 Age-related osteoporosis without current pathological fracture: Secondary | ICD-10-CM

## 2018-09-19 ENCOUNTER — Other Ambulatory Visit: Payer: Self-pay | Admitting: Primary Care

## 2018-09-19 DIAGNOSIS — R030 Elevated blood-pressure reading, without diagnosis of hypertension: Secondary | ICD-10-CM

## 2018-09-19 DIAGNOSIS — I10 Essential (primary) hypertension: Secondary | ICD-10-CM

## 2018-10-31 ENCOUNTER — Other Ambulatory Visit: Payer: Self-pay | Admitting: Primary Care

## 2018-10-31 DIAGNOSIS — Z1231 Encounter for screening mammogram for malignant neoplasm of breast: Secondary | ICD-10-CM

## 2018-11-10 ENCOUNTER — Ambulatory Visit
Admission: RE | Admit: 2018-11-10 | Discharge: 2018-11-10 | Disposition: A | Discharge status: Home / Self Care | Payer: Medicare (Managed Care) | Source: Ambulatory Visit | Attending: Primary Care | Admitting: Primary Care

## 2018-11-10 DIAGNOSIS — Z1231 Encounter for screening mammogram for malignant neoplasm of breast: Secondary | ICD-10-CM | POA: Insufficient documentation

## 2018-12-19 ENCOUNTER — Ambulatory Visit: Payer: Medicare (Managed Care) | Admitting: Vascular Surgery

## 2018-12-19 ENCOUNTER — Ambulatory Visit
Admission: RE | Admit: 2018-12-19 | Discharge: 2018-12-19 | Disposition: A | Discharge status: Home / Self Care | Payer: Medicare (Managed Care) | Source: Ambulatory Visit | Attending: Vascular Surgery | Admitting: Vascular Surgery

## 2018-12-19 ENCOUNTER — Encounter: Payer: Self-pay | Admitting: Vascular Surgery

## 2018-12-19 VITALS — BP 112/64 | Ht 64.02 in | Wt 140.4 lb

## 2018-12-19 DIAGNOSIS — Z95828 Presence of other vascular implants and grafts: Secondary | ICD-10-CM | POA: Insufficient documentation

## 2018-12-19 DIAGNOSIS — I714 Abdominal aortic aneurysm, without rupture, unspecified: Secondary | ICD-10-CM

## 2018-12-19 DIAGNOSIS — Z8679 Personal history of other diseases of the circulatory system: Secondary | ICD-10-CM | POA: Insufficient documentation

## 2018-12-19 DIAGNOSIS — Z9889 Other specified postprocedural states: Secondary | ICD-10-CM | POA: Insufficient documentation

## 2018-12-22 ENCOUNTER — Other Ambulatory Visit: Payer: Self-pay | Admitting: Primary Care

## 2018-12-22 DIAGNOSIS — E7800 Pure hypercholesterolemia, unspecified: Secondary | ICD-10-CM

## 2018-12-22 LAB — CV DOPPLER AORTA COMPLETE
Aorta Diameter A-P Distal: 3.8 cm
Aorta Diameter A-P Mid: 2.69 cm
Aorta Diameter A-P Prox: 2.87 cm
Aorta Diameter Trans Distal: 4.25 cm
Aorta Diameter Trans Mid: 2.95 cm
Aorta Diameter Trans Prox: 2.83 cm
Aorta EDV Distal: 0 cm/s
Aorta EDV Mid: 0 cm/s
Aorta EDV Prox: 8.04 cm/s
Aorta PSV Distal: 46.8 cm/s
Aorta PSV Mid: 22.22 cm/s
Aorta PSV Prox: 32.62 cm/s
Left Common Iliac Artery Prox AP Diameter: 1.06 cm
Left Common Iliac Artery Prox EDV: 0 cm/s
Left Common Iliac Artery Prox PSV: 72.89 cm/s
Left Common Iliac Artery Prox Trans Diameter: 1 cm
Right Common Iliac Artery Prox AP Diameter: 1.3 cm
Right Common Iliac Artery Prox EDV: 0 cm/s
Right Common Iliac Artery Prox PSV: 169.06 cm/s
Right Common Iliac Artery Prox Trans Diameter: 1.25 cm

## 2018-12-23 NOTE — Progress Notes (Signed)
Vascular Surgery Outpatient Clinic Follow-Up Visit    HPI:     [name] is a 78 y.o. female here for follow up.  She is s/p EVAR with R femoral endarterectomy and bovine pericardial patch angioplasty 11/14/16 with [facility] endovascular device.  PMH significant for HLD, chronic back pain, Sciatica, herniated discs, GERD and smoking history: quit in 2015. Denies claudication, rest pain, tissue loss.  On ASA/statin.    Vascular risk factors:   Age: 78 y.o.  Female gender: no  Diabetes mellitus: no  Obesity: no, Body mass index is 24.09 kg/m.  Hypertension: yes  Hyperlipidemia: yes  Tobacco abuse: no, quit  Family history: no  VQI Info:  Occupation: Retired  Ambulatory status: Ambulates  Current living status: Home Comorbidities:  Coronary artery disease: no  Renal disease: no  Lung disease: no,   Arrhythmia: no  Prior CVA: no  CHF: no  Prior PCI: no  Prior open heart surgery: no  Liver disease: no  Coagulopathy: no     REVIEW OF SYSTEMS  ROS    MEDICAL HISTORY  Past Medical History:   Diagnosis Date    AAA (abdominal aortic aneurysm)     Elevated LFTs     Unclear if this was related to statin, gallstones, alcohol consumption     Essential hypertension 06/11/2016    GERD (gastroesophageal reflux disease)     High blood pressure     HLD (hyperlipidemia)     Hx of colonic polyps     Colonscopy 08/11/10; repeat 2017 per GI     Lumbar disc herniation 2006    L5-S1    Osteoporosis     Spondylosis     Urinary retention        SURGICAL HISTORY  Past Surgical History:   Procedure Laterality Date    cataract repair Bilateral     COLONOSCOPY  2011    HYSTERECTOMY      left ovaries in place     TUBAL LIGATION         FAMILY HISTORY  Family History   Problem Relation Age of Onset    Dementia Mother 46    Cancer Mother         lung    High cholesterol Mother     Stroke Mother     Parkinsonism Father     Heart Disease Father 61        passed    High cholesterol Father     Heart attack Father     Cancer  Sister         Breast; in recovery    High cholesterol Sister     Hip fracture Sister     Breast cancer Sister 64    Cancer Brother         prostate    High cholesterol Brother     Diabetes Brother     Aneurysm Neg Hx     Anesthesia problems Neg Hx     Colon cancer Neg Hx     Colon polyps Neg Hx     Ovarian cancer Neg Hx         SOCIAL HISTORY   reports that she quit smoking about 5 years ago. Her smoking use included cigarettes. She has a 30.00 pack-year smoking history. She has quit using smokeless tobacco. She reports current alcohol use of about 9.0 standard drinks of alcohol per week. She reports that she does not use drugs.  ALLERGIES  Oxycodone and Lipitor [atorvastatin]     MEDICATIONS  Current Outpatient Medications   Medication Sig    hydroCHLOROthiazide (HYDRODIURIL) 25 MG tablet TAKE 1 TABLET BY MOUTH EVERY MORNING    amLODIPine (NORVASC) 5 MG tablet TAKE 1 TABLET BY MOUTH EVERY DAY    losartan (COZAAR) 50 MG tablet TAKE 1 TABLET BY MOUTH EVERY DAY    Calcium Carbonate-Vitamin D (CALCIUM 500 + D PO) Take 1 tablet by mouth 2 times daily    aspirin 81 MG tablet Take 1 tablet (81 mg total) by mouth daily    simvastatin (ZOCOR) 20 MG tablet TAKE 1 TABLET BY MOUTH EVERY DAY WITH DINNER     No current facility-administered medications for this visit.         Objective:      Vitals:    12/19/18 1102   BP: 112/64   Weight:    Height:      Imaging:  History: 1 yr f/u EVAR  Prior: 12/2017    Aortic endovascular graft appears patent without evidence of endoleak and a maximum residual sac of 4.25 cm, previously 4.9 cm.    Physical Exam:      General appearance: healthy, alert, active, no distress and cooperative  HEENT: PERRLA  CV: regular rate and rhythm, S1, S2 normal, no murmur, click, rub or gallop  Lungs:clear  Abd: soft, non-tender. Bowel sounds normal. No masses,  no organomegaly   Extremities: palpable femoral, dopplerable DP/PT signals  Neuro: normal without focal findings, mental  status, speech normal, alert and oriented x3, PERLA and reflexes normal and symmetric    Assessment:   [name] is a 78 y.o. female who has done well s/p EVAR with R femoral endarterectomy with evidence of AAA sac shrinkage.      Plan:   1. F/u 1 year with repeat US aorta.  2. Continue ASA/statin.       Turner Daniels, MD MPH  12/23/2018 at 2:59 PM

## 2019-03-09 ENCOUNTER — Other Ambulatory Visit: Payer: Self-pay | Admitting: Primary Care

## 2019-03-09 DIAGNOSIS — R030 Elevated blood-pressure reading, without diagnosis of hypertension: Secondary | ICD-10-CM

## 2019-03-09 DIAGNOSIS — I10 Essential (primary) hypertension: Secondary | ICD-10-CM

## 2019-03-21 ENCOUNTER — Other Ambulatory Visit: Payer: Self-pay | Admitting: Primary Care

## 2019-03-21 DIAGNOSIS — I10 Essential (primary) hypertension: Secondary | ICD-10-CM

## 2019-03-28 ENCOUNTER — Other Ambulatory Visit: Payer: Self-pay | Admitting: Pulmonary Disease

## 2019-03-28 DIAGNOSIS — Z23 Encounter for immunization: Secondary | ICD-10-CM

## 2019-04-07 ENCOUNTER — Encounter: Payer: Self-pay | Admitting: Family Medicine

## 2019-04-07 ENCOUNTER — Ambulatory Visit: Payer: Medicare (Managed Care) | Admitting: Family Medicine

## 2019-04-07 VITALS — BP 100/62 | HR 82 | Temp 96.1°F | Ht 64.02 in | Wt 143.2 lb

## 2019-04-07 DIAGNOSIS — R140 Abdominal distension (gaseous): Secondary | ICD-10-CM

## 2019-04-07 DIAGNOSIS — M5440 Lumbago with sciatica, unspecified side: Secondary | ICD-10-CM

## 2019-04-07 NOTE — Progress Notes (Signed)
Patient ID:  [name]  1940/10/18    Subjective:    CC:  Back Pain    HPI    Patient has complaint of continued and recently exacerbating low back pain.  She not having any radicular symptoms into her lower extremities no change in bowel or bladder habits.  She is using some heat for symptom relief which has not been effective.  Her back pain has been limiting activity including diminishing her ability to ambulate comfortably.    Few years previously she was seen at physical rehab and had an MRI ordered for evaluation.  Patient was hesitate to complete this as she is concerned about claustrophobia.    [name] has a current medication list which includes the following prescription(s): hydrochlorothiazide, losartan, amlodipine, simvastatin, calcium carbonate-vitamin d, and aspirin.  [name] is allergic to oxycodone and lipitor [atorvastatin].      Medications and allergies were reviewed and reconciled with the patient in eRecord today; and No changes were made.      Review of Systems   Musculoskeletal: Positive for back pain.         Objective:    Vitals:    04/07/19 1449   BP: 100/62   Pulse: 82   Temp: 35.6 C (96.1 F)   SpO2: 96%   Weight: 65 kg (143 lb 3.2 oz)   Height: 1.626 m (5' 4.02")     Physical Exam  Eyes:      Pupils: Pupils are equal, round, and reactive to light.   Cardiovascular:      Rate and Rhythm: Normal rate and regular rhythm.   Pulmonary:      Effort: Pulmonary effort is normal.   Musculoskeletal:         General: Normal range of motion.      Cervical back: Normal range of motion.   Skin:     General: Skin is warm and dry.   Neurological:      Mental Status: She is alert and oriented to person, place, and time.   Psychiatric:         Mood and Affect: Mood and affect normal.         Judgment: Judgment normal.      Comments: SOme difficulty with keeping plan organized         No results found for this or any previous visit (from the past 24 hour(s)).    Assessment/Plan:  [name] was seen today for  back pain.    Diagnoses and all orders for this visit:    Bilateral low back pain with sciatica, sciatica laterality unspecified, unspecified chronicity  -     MR lumbar spine without contrast; Future  -     AMB REFERRAL TO PHYSICAL MEDICINE AND REHABILITATION    Generalized bloating  -     US abdominal complete; Future        No follow-ups on file.    Romualdo Bolk FNP-C

## 2019-04-07 NOTE — Patient Instructions (Signed)
Small intestinal bacterial overgrowth

## 2019-04-14 ENCOUNTER — Encounter: Payer: Self-pay | Admitting: Primary Care

## 2019-04-14 ENCOUNTER — Telehealth: Payer: Self-pay | Admitting: Primary Care

## 2019-04-14 NOTE — Telephone Encounter (Signed)
[name], Diane  P Img Prior [facility]  Hello,   [name] is schdeduled for MRI L Spine on Thursday, April 22nd at our [place] location.     [employer] is requesting a peer to peer in order to approve the MRI L Spine w/wo contrast [id number].     Please have Dr. Merrilyn Puma call [employer] at [phone] to speak with a Medical Director and reference the [phone].     Based on Medicare National Coverage Determinations (NCD): 220.2 Magnetic   Resonance Imaging and [employer] Spine Imaging Guidelines Section(s): SP 6.1 Lower   Extremity Pain with Neurological Features (Radiculopathy, Radiculitis, or Plexopathy   and Neuropathy) with or without Low Back (Lumbar Spine) Pain and 1.0 General   Guidelines, we cannot approve this request. Your records show that you are having   lower back pain that travels to your hip and/or leg. The reason this request cannot be   approved is because:   One of the following must be met. -Follow up contact (in person, by phone, by mail, or   by messaging) with your doctor confirmed a failed recent (within three months) six week   trial of doctor prescribed treatment. Supported treatments include (but are not limited to)   medications for swelling or pain, physical therapy, and/or oral or injected steroids. -You   have a sign or symptom that suggests a serious underlying condition for which a trial of   treatment is not needed.     Clinical information was faxed to [employer] on 04/09/2019.     Thank you   Diane

## 2019-04-14 NOTE — Progress Notes (Signed)
Put the request for a peer-to-peer regarding Anayat Nolt MRI L Spine w/wo contrast [id number] on Dr. Audrea Muscat chair. Dr. Merrilyn Puma to call [employer] at [phone]. Case number:  [id number].    Staff message from [facility] staff member:  Elease Hashimoto

## 2019-04-20 ENCOUNTER — Telehealth: Payer: Self-pay | Admitting: Primary Care

## 2019-04-20 NOTE — Telephone Encounter (Signed)
[name], [name]  P Img Prior [facility]; Pilar Plate, MD  Hello,     Taniqua Issa is scheduled for a MRI on Thursday, April 22nd at our [place] location.      [employer] has denied your request. Please see explanation from them below:     we cannot approve this request.   Your records show that you are having lower back pain that travels to your hip and/or   leg. The reason this request cannot be approved is because:     One of the following must be met. -Follow up contact (in person, by phone, by mail, or by   messaging) with your doctor confirmed a failed recent (within three months) six week trial of   doctor prescribed treatment. These treatments may include (but are not limited to) drugs for   swelling or pain, an in office workout (physical therapy), and/or oral or injected steroids. -You   have a sign or symptom that suggests a severe problem (serious underlying condition) for which   a trial of treatment is not needed.     To contact [employer], to complete a Peer to Peer call [phone]     Case #[id number]   Pt's ID#[id number]     Please respond to this message with outcome, include Authorization number and effective dates. If not approved, contact Central Scheduling at [phone] to CANCEL and please reach out to the patient to advise them of the situation.     Thank you,     [name]

## 2019-04-20 NOTE — Telephone Encounter (Signed)
There is a request for peer to peer consult. Would it be possible for [place] to organize this as I can likely represent a good reason for MRI.  Alternative is to help patient move forward with apt. with Physical rehab appointment. They can order [place] if they believe it is helpful.    Pt. Is deteriorating in cognition and could use help setting up the rehab appointment.    Norina Buzzard. [facility] FNP-C  [facility]  48ffice # [phone]

## 2019-04-20 NOTE — Telephone Encounter (Signed)
From visit note 04/07/19:  Assessment/Plan:  [name] was seen today for back pain.    Diagnoses and all orders for this visit:    Bilateral low back pain with sciatica, sciatica laterality unspecified, unspecified chronicity  -     MR lumbar spine without contrast; Future  -     AMB REFERRAL TO PHYSICAL MEDICINE AND REHABILITATION

## 2019-04-20 NOTE — Telephone Encounter (Signed)
Patient called me back and said that insurance company states we didn't send the proper certification?  I am guessing they are looking for what they stated below.  Currently, if she goes without the auth in place, it will cost her $450 and she doesn't have that to pay.  Hoping for a quick resolution to this.

## 2019-04-20 NOTE — Telephone Encounter (Signed)
Patient is going to call insurance company, but MRI was denied, stating because she has financial aid (that doesn't make sense).  She was told she probably would need PT and/or treatment and then they will re-evaluate.  She is a lot of pain and needs advice on what to do.      Patient is going for her Covid Vaccine and will not be back til this afternoon. You can leave a message.

## 2019-04-20 NOTE — Telephone Encounter (Signed)
Contacted Dr. Vista Lawman office and got patient in to the [facility] office on Wednesday the 21st at 2:45.  Patient is aware.  I canceled the MRI, but left the [place] in place and left message for patient not to miss that appointment.

## 2019-04-21 NOTE — Telephone Encounter (Signed)
See separate encounter. Copy of that note is repeated here:    "I reviewed the chart.  Placed a call to [place], [phone]    to schedule a Peer to Peer call     Case [id number]   Pt's [id number]   4/16 denied   The set up a 6:45 EST  With Dr. Emeline General   She called and we discussed. She said she would forward the info to [facility] and they should make a decision in 24-48 hours.  She indicated she thought they would authorize it.   I said she was currently schedule for Thursday 04/23/19 11:15 AM and she said she would request an expedited decision.     ==========================  The note from [place] included:   Please respond to this message with outcome, include Authorization number and effective dates. If not approved, contact Central Scheduling at [phone] to CANCEL and please reach out to the patient to advise them of the situation.    =========  Could you call Central scheduling and let them know this is the situation.   Also she has clautrophobia, and I would be willing to write for a sedative (like diazepam 5 mg #2) to take 1 1/2 hour prior to the procedure so long as someone is driving her. "

## 2019-04-21 NOTE — Telephone Encounter (Signed)
I reviewed the chart.  Placed a call to [place], [phone]    to schedule a Peer to Peer call     Case [id number]   Pt's [id number]   4/16 denied   The set up a 6:45 EST  With Dr. Emeline General   She called and we discussed. She said she would forward the info to [facility] and they should make a decision in 24-48 hours.  She indicated she thought they would authorize it.   I said she was currently schedule for Thursday 04/23/19 11:15 AM and she said she would request an expedited decision.     ==========================  The note from [place] included:   Please respond to this message with outcome, include Authorization number and effective dates.  If not approved, contact Central Scheduling at [phone] to CANCEL and please reach out to the patient to advise them of the situation.    =========  Could you call Central scheduling and let them know this is the situation.   Also she has clautrophobia, and I would be willing to write for a sedative (like diazepam 5 mg #2) to take 1 1/2 hour prior to the procedure so long as someone is driving her.

## 2019-04-22 ENCOUNTER — Encounter: Payer: Self-pay | Admitting: Physical Medicine and Rehabilitation

## 2019-04-22 ENCOUNTER — Ambulatory Visit: Payer: Medicare (Managed Care) | Admitting: Physical Medicine and Rehabilitation

## 2019-04-22 VITALS — BP 126/66 | HR 77 | Ht 62.99 in | Wt 141.4 lb

## 2019-04-22 DIAGNOSIS — M4807 Spinal stenosis, lumbosacral region: Secondary | ICD-10-CM

## 2019-04-22 NOTE — Progress Notes (Signed)
Chief Complaint: Chronic low back pain    History of Present Illness: Chronic low back pain and leg symptoms with prolonged walking and standing.  This is been a chronic issue for her.  She again notes the pain with walking somewhat with bending and flexion activities decreased with sitting.  She is tried physical therapy and has not noticed a significant change despite multiple tries at the therapy.  She is also been doing her home exercise program with minimal change.  We discussed this in the distant past and at that point we discussed the necessity of an MRI before proceeding with anything more invasive.  She is severely claustrophobic and has not been able to come to a point where she is willing to proceed with an MRI.  She is wondering how best to proceed.    Past Medical, Surgical History and Review of Systems were reviewed and annotated as appropriate with the electronic medical chart.    Vitals:    04/22/19 1510   BP: 126/66   Pulse: 77   Weight: 64.1 kg (141 lb 6.4 oz)   Height: 1.6 m (5' 2.99")     Physical Examination:   The patient's gait and gross motor testing is intact she is has a scoliotic curve and a truncal shift.  Minimal spinal extension and sidebending flexion is relatively intact with some pain only with slight flexion.  Her strength in her lower extremities reflexes were all normal except for reduced Achilles reflex bilaterally symmetrically likely related to age.  Describes some numbness in between 2 of her toes bilaterally with some tenderness to palpation consistent with a neuroma.    Diagnostic testing/data review and interpretation:  I personally reviewed, and discussed/reviewed with the patient previous CT abdomen and pelvis is available for review as is her x-rays standing of her lumbar spine demonstrated degenerative scoliosis and multilevel degenerative changes of the lumbar spine.    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  It is unlikely she would have been able to make it through the study due to her claustrophobia.  I have suggested that we just proceed with a CT of the lumbar spine and I will see her back.  We discussed the procedure the CT scan she feels she can do this and she will make sure her insurance and financial assistance will cover the study.  I will see her back after the test and we can discuss potential injection options.  She understands some aspects of arthritis we cannot truly manage with injections or even medicines but will discuss it further when I see her back.

## 2019-04-23 ENCOUNTER — Ambulatory Visit: Payer: Medicare (Managed Care) | Admitting: Radiology

## 2019-04-23 ENCOUNTER — Encounter: Payer: Medicare (Managed Care) | Admitting: Family Medicine

## 2019-04-23 ENCOUNTER — Ambulatory Visit
Admission: RE | Admit: 2019-04-23 | Discharge: 2019-04-23 | Disposition: A | Discharge status: Home / Self Care | Payer: Medicare (Managed Care) | Source: Ambulatory Visit | Admitting: Radiology

## 2019-04-23 DIAGNOSIS — M4807 Spinal stenosis, lumbosacral region: Secondary | ICD-10-CM

## 2019-04-23 DIAGNOSIS — K828 Other specified diseases of gallbladder: Secondary | ICD-10-CM

## 2019-04-23 DIAGNOSIS — R140 Abdominal distension (gaseous): Secondary | ICD-10-CM

## 2019-04-23 DIAGNOSIS — I714 Abdominal aortic aneurysm, without rupture: Secondary | ICD-10-CM

## 2019-04-23 DIAGNOSIS — N189 Chronic kidney disease, unspecified: Secondary | ICD-10-CM

## 2019-04-23 NOTE — Result Encounter Note (Signed)
E-mail Sent to patient explaining test results

## 2019-05-06 ENCOUNTER — Ambulatory Visit
Admission: RE | Admit: 2019-05-06 | Discharge: 2019-05-06 | Disposition: A | Discharge status: Home / Self Care | Payer: Medicare (Managed Care) | Source: Ambulatory Visit | Attending: Physical Medicine and Rehabilitation | Admitting: Physical Medicine and Rehabilitation

## 2019-05-06 DIAGNOSIS — M5137 Other intervertebral disc degeneration, lumbosacral region: Secondary | ICD-10-CM | POA: Insufficient documentation

## 2019-05-06 DIAGNOSIS — M48061 Spinal stenosis, lumbar region without neurogenic claudication: Secondary | ICD-10-CM

## 2019-05-06 DIAGNOSIS — M5136 Other intervertebral disc degeneration, lumbar region: Secondary | ICD-10-CM | POA: Insufficient documentation

## 2019-05-06 DIAGNOSIS — M4807 Spinal stenosis, lumbosacral region: Secondary | ICD-10-CM | POA: Insufficient documentation

## 2019-05-27 ENCOUNTER — Ambulatory Visit: Payer: Medicare (Managed Care) | Admitting: Physical Medicine and Rehabilitation

## 2019-05-27 ENCOUNTER — Encounter: Payer: Self-pay | Admitting: Physical Medicine and Rehabilitation

## 2019-05-27 VITALS — BP 127/72 | HR 71 | Ht 63.0 in | Wt 141.0 lb

## 2019-05-27 DIAGNOSIS — M4807 Spinal stenosis, lumbosacral region: Secondary | ICD-10-CM

## 2019-05-27 NOTE — Progress Notes (Signed)
Chief Complaint: Follow-up for chronic low back pain    History of Present Illness: The patient returns in follow-up for her chronic lower back discomfort.  Unfortunately her symptoms continue to be problematic.  CT scan of the lumbar spine is available for review.  No new symptoms.    Past Medical, Surgical History and Review of Systems were reviewed and annotated as appropriate with the electronic medical chart.    Vitals:    05/27/19 1036   BP: 127/72   Pulse: 71   Weight: 64 kg (141 lb)   Height: 1.6 m (5\' 3" )     Diagnostic testing/data review and interpretation:  I personally reviewed, and discussed/reviewed with the patient degenerative scoliosis with severe lumbar disc degeneration all levels with L4-5 central stenosis and lateral recess narrowing impacting the traversing L5 nerve roots as well as severe right L5 foraminal narrowing.    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  Of also introduced the concept of Cymbalta as a choice.  Of indicated to the patient that beyond these 2 choices with the severe multilevel disc degeneration there may not be much more to pursue.  All of her questions were answered.

## 2019-05-29 ENCOUNTER — Encounter: Payer: Self-pay | Admitting: Physical Medicine and Rehabilitation

## 2019-06-15 ENCOUNTER — Other Ambulatory Visit: Payer: Self-pay | Admitting: Primary Care

## 2019-06-15 DIAGNOSIS — E7800 Pure hypercholesterolemia, unspecified: Secondary | ICD-10-CM

## 2019-06-15 NOTE — Telephone Encounter (Signed)
LOV 04/07/19. No upcoming appointment

## 2019-06-16 ENCOUNTER — Ambulatory Visit: Payer: Medicare (Managed Care) | Admitting: Physical Medicine and Rehabilitation

## 2019-06-16 ENCOUNTER — Ambulatory Visit
Admission: RE | Admit: 2019-06-16 | Discharge: 2019-06-16 | Disposition: A | Discharge status: Home / Self Care | Payer: Medicare (Managed Care) | Source: Ambulatory Visit | Attending: Physical Medicine and Rehabilitation | Admitting: Physical Medicine and Rehabilitation

## 2019-06-16 ENCOUNTER — Other Ambulatory Visit: Payer: Self-pay | Admitting: Physical Medicine and Rehabilitation

## 2019-06-16 ENCOUNTER — Encounter: Payer: Self-pay | Admitting: Physical Medicine and Rehabilitation

## 2019-06-16 VITALS — BP 143/63 | HR 80 | Temp 99.0°F | Resp 16 | Ht 64.0 in | Wt 141.0 lb

## 2019-06-16 DIAGNOSIS — M5416 Radiculopathy, lumbar region: Secondary | ICD-10-CM

## 2019-06-16 DIAGNOSIS — M48062 Spinal stenosis, lumbar region with neurogenic claudication: Secondary | ICD-10-CM

## 2019-06-16 DIAGNOSIS — M545 Low back pain, unspecified: Secondary | ICD-10-CM

## 2019-06-16 MED ORDER — LIDOCAINE HCL 1 % IJ SOLN *I*
1.5000 mL | Freq: Once | INTRAMUSCULAR | Status: AC | PRN
Start: 2019-06-16 — End: 2019-06-16
  Administered 2019-06-16: 1.5 mL via SUBCUTANEOUS

## 2019-06-16 MED ORDER — DEXAMETHASONE SOD PHOSPHATE PF 10 MG/ML IJ SOLN *I*
12.0000 mg | Freq: Once | INTRAMUSCULAR | Status: AC | PRN
Start: 2019-06-16 — End: 2019-06-16
  Administered 2019-06-16: 12 mg

## 2019-06-16 MED ORDER — LIDOCAINE HCL 1% IJ SOLN (PF) (AMB) *I*
2.0000 mL | Freq: Once | INTRAMUSCULAR | Status: AC | PRN
Start: 2019-06-16 — End: 2019-06-16
  Administered 2019-06-16: 2 mL

## 2019-06-16 MED ORDER — IOHEXOL 240 MG/ML (OMNIPAQUE) IV SOLN *I*
1.0000 mL | Freq: Once | INTRAMUSCULAR | Status: AC | PRN
Start: 2019-06-16 — End: 2019-06-16
  Administered 2019-06-16: 1 mL

## 2019-06-16 NOTE — Patient Instructions (Signed)
Post Spinal Injection / Procedure Instructions    Instructions:  Notify the office of the doctor who performed your procedure if you experience any of the following:   Fever of 100 degrees Fahrenheit or 38 degrees Celsius or greater.   Excessive swelling or redness at the injection site.   Excessive pain or numbness that lasts longer than 72 hours after your injections.   Severe headache that goes away when you lie flat and comes back when you sit upright    Activity:   Rest after your injection for 24 hours, then return to your usual activities.  This includes any restrictions your physician may have prescribed.   Do not drive the day of your injection.    Care of the injection site:   You may ice the injection site for local discomfort.  Cover ice with a cloth and apply light pressure.  Never place ice directly on the skin.   Apply ice for 15 minutes on then off for 1 hour.  Repeat as needed.   Take band aids off the following day.    For diabetic patients:   Check your blood sugar level every 8 hours for the next 72 hours.  If it is greater than 250, call your primary care physician.    Medications:   You may continue taking your pre-injection medications as previously prescribed.   Remember, the steroid you had injected today usually takes 1 to 3 days to take effect, however it can take up to 7 days.    Contacts/Questions:  In case of an urgent issue or questions:   Dr. Serita Grit secretary can be reached at [phone]   Dr. Vista Lawman secretary can be reached at [phone]   Dr. Kandis Nab secretary can be reached at [phone]   Dr. Glenford Bayley secretary can be reached at [phone]   Dr. Eli Hose secretary can be reached at [phone]   If it is after 4:30 PM, a weekend or a holiday, call the answering service at [phone] and ask for the physician on call.    Follow-up Office Visit:   Please call Julieta Gutting, PA in 2-3 weeks.

## 2019-06-16 NOTE — Procedures (Signed)
Date/Time: 06/16/2019  8:20 AM EDT  Epidural Steroid Injection(s): Transforaminal - lumbar or sacral 1st level - CPT 64483  Laterality: Bilateral  Contrast - Left: 1 mL iohexol 240 MG/ML  Contrast - Right: 1 mL iohexol 240 MG/ML  Anesthetic medication - Left:  1.5 mL lidocaine hcl 1 %; 2 mL Lidocaine HCl (PF) 1 %  Anesthetic medications - Right: 1.5 mL lidocaine hcl 1 %; 2 mL Lidocaine HCl (PF) 1 %  Steroid medication - Left: 12 mg dexamethasone PF 10 MG/ML  Steroid medication - Right: 12 mg dexamethasone PF 10 MG/ML    Attending Physician:  Carolynn Sayers. Karle Starch, M.D.    Indication: Lumbar Radiculopathy M54.16, Spinal Stenosis - Lumbar M48.062    Procedure:    Bilateral S1 Transforaminal Epidural Steroid Injection CPT Code:  Q6064569    After a thorough discussion of the risks, benefits, potential complications, including but not limited to infection and bleeding, and alternatives to injection therapy the patient gave informed written consent to proceed.  The patient was placed prone on the fluoroscopic table and then using fluoroscopic guidance the left followed by the right S1 pedicles were identified.  These areas were then prepped with Clorhexidine X 3 which was allowed to dry. Then using 3 cc of 1% lidocaine the skin and subcutaneous tissue was anesthetized.  Then using separate 22 gauge spinal needles the transforaminal spaces were entered without difficulty just inferior to the pedicles, paying particular attention to staying in the superior lateral margin of the foramen.  1 cc of Omnipaque 240 was slowly infused showing good superior spread within the transforaminal space and along the exiting nerve roots.  Then 12 mg of Dexamethasone Sodium Phosphate and 2 cc of 1% lidocaine were slowly infused into each side. The patient tolerated the procedure well and no complications were noted.  The patient was observed in the recovery room without problems was released home in the care of their driver.  I will see  the patient back in 2-4 weeks to review her response to the steroid component.  The patient understands that this injection is a part of their comprehensive rehabilitation program and they need to continue their functional rehabilitation.

## 2019-06-16 NOTE — Progress Notes (Signed)
Has driver. Denies antibiotics. Takes daily aspirin.

## 2019-06-29 ENCOUNTER — Telehealth: Payer: Self-pay | Admitting: Physical Medicine and Rehabilitation

## 2019-06-29 NOTE — Telephone Encounter (Signed)
Imara was seen on 6.15.21    For a:    Procedure:       Bilateral S1 Transforaminal Epidural Steroid Injection CPT Code:  42683-41    Kamil states she was somewhat pain free for about 2 days she felt 60% better - pain has slowly started to return  - if she thinks she can get some relief this time around she would like a repeat, she wants to know  would you suggest her next step be - please advise    Thank you

## 2019-06-30 ENCOUNTER — Encounter: Payer: Self-pay | Admitting: Physical Medicine and Rehabilitation

## 2019-06-30 ENCOUNTER — Other Ambulatory Visit: Payer: Self-pay | Admitting: Physical Medicine and Rehabilitation

## 2019-06-30 DIAGNOSIS — M4807 Spinal stenosis, lumbosacral region: Secondary | ICD-10-CM

## 2019-07-20 ENCOUNTER — Other Ambulatory Visit: Payer: Self-pay | Admitting: Physical Medicine and Rehabilitation

## 2019-07-20 DIAGNOSIS — M545 Low back pain, unspecified: Secondary | ICD-10-CM

## 2019-07-21 ENCOUNTER — Ambulatory Visit: Payer: Medicare (Managed Care) | Admitting: Physical Medicine and Rehabilitation

## 2019-07-21 ENCOUNTER — Ambulatory Visit
Admission: RE | Admit: 2019-07-21 | Discharge: 2019-07-21 | Disposition: A | Discharge status: Home / Self Care | Payer: Medicare (Managed Care) | Source: Ambulatory Visit | Attending: Physical Medicine and Rehabilitation | Admitting: Physical Medicine and Rehabilitation

## 2019-07-21 ENCOUNTER — Encounter: Payer: Self-pay | Admitting: Physical Medicine and Rehabilitation

## 2019-07-21 VITALS — BP 144/94 | HR 53 | Temp 97.8°F | Resp 16

## 2019-07-21 DIAGNOSIS — M545 Low back pain, unspecified: Secondary | ICD-10-CM

## 2019-07-21 DIAGNOSIS — M5416 Radiculopathy, lumbar region: Secondary | ICD-10-CM | POA: Insufficient documentation

## 2019-07-21 DIAGNOSIS — M48062 Spinal stenosis, lumbar region with neurogenic claudication: Secondary | ICD-10-CM

## 2019-07-21 MED ORDER — DEXAMETHASONE SOD PHOSPHATE PF 10 MG/ML IJ SOLN *I*
12.0000 mg | Freq: Once | INTRAMUSCULAR | Status: AC | PRN
Start: 2019-07-21 — End: 2019-07-21
  Administered 2019-07-21: 12 mg

## 2019-07-21 MED ORDER — LIDOCAINE HCL 1 % IJ SOLN *I*
1.5000 mL | Freq: Once | INTRAMUSCULAR | Status: AC | PRN
Start: 2019-07-21 — End: 2019-07-21
  Administered 2019-07-21: 1.5 mL via SUBCUTANEOUS

## 2019-07-21 MED ORDER — LIDOCAINE HCL 1% IJ SOLN (PF) (AMB) *I*
1.0000 mL | Freq: Once | INTRAMUSCULAR | Status: AC | PRN
Start: 2019-07-21 — End: 2019-07-21
  Administered 2019-07-21: 1 mL

## 2019-07-21 MED ORDER — IOHEXOL 240 MG/ML (OMNIPAQUE) IV SOLN *I*
1.0000 mL | Freq: Once | INTRAMUSCULAR | Status: AC | PRN
Start: 2019-07-21 — End: 2019-07-21
  Administered 2019-07-21: 1 mL

## 2019-07-21 NOTE — Patient Instructions (Signed)
Post Spinal Injection / Procedure Instructions    Instructions:  Notify the office of the doctor who performed your procedure if you experience any of the following:   Fever of 100 degrees Fahrenheit or 38 degrees Celsius or greater.   Excessive swelling or redness at the injection site.   Excessive pain or numbness that lasts longer than 72 hours after your injections.   Severe headache that goes away when you lie flat and comes back when you sit upright    Activity:   Rest after your injection for 24 hours, then return to your usual activities.  This includes any restrictions your physician may have prescribed.   Do not drive the day of your injection.    Care of the injection site:   You may ice the injection site for local discomfort.  Cover ice with a cloth and apply light pressure.  Never place ice directly on the skin.   Apply ice for 15 minutes on then off for 1 hour.  Repeat as needed.   Take band aids off the following day.    For diabetic patients:   Check your blood sugar level every 8 hours for the next 72 hours.  If it is greater than 250, call your primary care physician.    Medications:   You may continue taking your pre-injection medications as previously prescribed.   Remember, the steroid you had injected today usually takes 1 to 3 days to take effect, however it can take up to 7 days.    Contacts/Questions:  In case of an urgent issue or questions:   Dr. Serita Grit secretary can be reached at [phone]   Dr. Vista Lawman secretary can be reached at [phone]   Dr. Kandis Nab secretary can be reached at [phone]   Dr. Glenford Bayley secretary can be reached at [phone]   Dr. Eli Hose secretary can be reached at [phone]   If it is after 4:30 PM, a weekend or a holiday, call the answering service at [phone] and ask for the physician on call.    Follow-up Office Visit:   You need to call in 2 weeks with how you are feeling.

## 2019-07-21 NOTE — Procedures (Signed)
Date/Time: 07/21/2019  9:40 AM EDT  Epidural Steroid Injection(s): Transforaminal - lumbar or sacral 1st level - CPT 64483  Laterality: Bilateral  Contrast - Left: 1 mL iohexol 240 MG/ML  Contrast - Right: 1 mL iohexol 240 MG/ML  Anesthetic medication - Left:  1.5 mL lidocaine hcl 1 %; 1 mL Lidocaine HCl (PF) 1 %  Anesthetic medications - Right: 1.5 mL lidocaine hcl 1 %; 1 mL Lidocaine HCl (PF) 1 %  Steroid medication - Left: 12 mg dexamethasone PF 10 MG/ML  Steroid medication - Right: 12 mg dexamethasone PF 10 MG/ML    Attending Physician:  Carolynn Sayers. Karle Starch, M.D.    Indication: Lumbar Radiculopathy M54.16, Spinal Stenosis - Lumbar M48.062    Procedure:    Bilateral L5 Transforaminal Epidural Steroid Injection CPT Code:  Q6064569    After a thorough discussion of the risks, benefits, potential complications, including but not limited to infection and bleeding, and alternatives to injection therapy the patient gave informed written consent to proceed.  The patient was placed prone on the fluoroscopic table and then using fluoroscopic guidance the left followed by the right L5 pedicles were identified.  These areas were then prepped with Clorhexidine X 3 which was allowed to dry. Then using 3 cc of 1% lidocaine the skin and subcutaneous tissue was anesthetized.  Then using separate 22 gauge spinal needles the transforaminal spaces were entered without difficulty just inferior to the pedicles, paying particular attention to staying in the superior lateral margin of the foramen.  1 cc of Omnipaque 240 was slowly infused showing good superior spread within the transforaminal space and along the exiting nerve roots.  Then 12 mg of Dexamethasone Sodium Phosphate and 1 cc of 1% lidocaine were slowly infused into each side. The patient tolerated the procedure well and no complications were noted.  The patient was observed in the recovery room without problems was released home in the care of their driver.  I will see  the patient back in 2-4 weeks to review her response to the steroid component.  The patient understands that this injection is a part of their comprehensive rehabilitation program and they need to continue their functional rehabilitation.

## 2019-07-21 NOTE — Progress Notes (Signed)
Has driver, arrived ambulatory. Denies abx and takes daily 81mg asa.

## 2019-08-04 ENCOUNTER — Telehealth: Payer: Self-pay | Admitting: Physical Medicine and Rehabilitation

## 2019-08-04 ENCOUNTER — Encounter: Payer: Self-pay | Admitting: Physical Medicine and Rehabilitation

## 2019-08-04 NOTE — Telephone Encounter (Signed)
2 Week injection update    7.20.21    Procedure:       Bilateral L5 Transforaminal Epidural Steroid Injection CPT Code:  21828-83    [name] states she felt great for the 1st 3 days, about 2 days ago her pain has returned.      Pt wants to know if the steroid makes her gain weight - what are her options if she does not want another procedure.    Please advise.

## 2019-08-06 ENCOUNTER — Ambulatory Visit: Payer: Medicare (Managed Care) | Admitting: Family Medicine

## 2019-08-06 ENCOUNTER — Telehealth: Payer: Self-pay

## 2019-08-06 ENCOUNTER — Encounter: Payer: Self-pay | Admitting: Family Medicine

## 2019-08-06 VITALS — BP 142/84 | HR 82 | Temp 96.2°F | Ht 64.02 in | Wt 143.2 lb

## 2019-08-06 DIAGNOSIS — K219 Gastro-esophageal reflux disease without esophagitis: Secondary | ICD-10-CM

## 2019-08-06 DIAGNOSIS — I10 Essential (primary) hypertension: Secondary | ICD-10-CM

## 2019-08-06 DIAGNOSIS — R030 Elevated blood-pressure reading, without diagnosis of hypertension: Secondary | ICD-10-CM

## 2019-08-06 DIAGNOSIS — M545 Low back pain, unspecified: Secondary | ICD-10-CM

## 2019-08-06 MED ORDER — CYCLOBENZAPRINE HCL 5 MG PO TABS *I*
ORAL_TABLET | ORAL | 1 refills | Status: DC
Start: 2019-08-06 — End: 2019-08-06

## 2019-08-06 MED ORDER — CYCLOBENZAPRINE HCL 5 MG PO TABS *I*
ORAL_TABLET | ORAL | 1 refills | Status: DC
Start: 2019-08-06 — End: 2019-08-17

## 2019-08-06 MED ORDER — LOSARTAN POTASSIUM 50 MG PO TABS *I*
25.0000 mg | ORAL_TABLET | Freq: Every day | ORAL | 1 refills | Status: DC
Start: 2019-08-06 — End: 2019-09-01

## 2019-08-06 MED ORDER — OMEPRAZOLE 20 MG PO CPDR *I*
DELAYED_RELEASE_CAPSULE | ORAL | 5 refills | Status: DC
Start: 2019-08-06 — End: 2019-11-30

## 2019-08-06 MED ORDER — OMEPRAZOLE 20 MG PO CPDR *I*
DELAYED_RELEASE_CAPSULE | ORAL | 5 refills | Status: DC
Start: 2019-08-06 — End: 2019-08-06

## 2019-08-06 NOTE — Telephone Encounter (Signed)
Prior Authorization  Pt: [name]  DOB: 09-15-1940  RX: Cyclobenzaprine HCL 5MG  Tablets  DX: Low Back Pain  Key# [id number]  Thank you

## 2019-08-07 NOTE — Progress Notes (Signed)
Patient ID:  [name]  [birth year]    Subjective:    CC:  Back Pain    HPI    Unfortunately patient is having continued problems with what she describes as primarily mid back pain.  This is just within a week of LS spine injections for chronic lower back pain.  She is not expressing any radicular symptoms into her lower extremities.    Patient is also concerned he got ongoing flux symptoms.  Her diet is excellent as far as food choices that would contribute to heartburn.        [name] has a current medication list which includes the following prescription(s): acetaminophen, losartan, simvastatin, hydrochlorothiazide, amlodipine, calcium carbonate-vitamin d, aspirin, cyclobenzaprine, and omeprazole.  [name] is allergic to oxycodone, environmental allergies, and lipitor [atorvastatin].      Medications and allergies were reviewed and reconciled with the patient in eRecord today; and No changes were made.      Review of Systems   Musculoskeletal: Positive for back pain.   Neurological: Negative for tingling, sensory change and focal weakness.         Objective:    Vitals:    08/06/19 0936   BP: 142/84   Pulse: 82   Temp: 35.7 C (96.2 F)   SpO2: 98%   Weight: 65 kg (143 lb 3.2 oz)   Height: 1.626 m (5' 4.02")     Physical Exam  Eyes:      Pupils: Pupils are equal, round, and reactive to light.   Cardiovascular:      Rate and Rhythm: Normal rate and regular rhythm.   Pulmonary:      Effort: Pulmonary effort is normal.   Musculoskeletal:         General: Normal range of motion.      Cervical back: Normal range of motion.      Comments: Patient is extremely tight in bilateral paraspinous muscles especially in the lower mid back where she is symptomatic   Skin:     General: Skin is warm and dry.   Neurological:      Mental Status: She is alert and oriented to person, place, and time.   Psychiatric:         Mood and Affect: Mood and affect normal.         Judgment: Judgment normal.         No results found for this or  any previous visit (from the past 24 hour(s)).    Assessment/Plan:  [name] was seen today for back pain.    Diagnoses and all orders for this visit:    Low back pain  -     Discontinue: cyclobenzaprine (FLEXERIL) 5 MG tablet; One or two tabs q 8 hrs for pain  -     cyclobenzaprine (FLEXERIL) 5 MG tablet; One or two tabs Q8 hrs PRN Back pain    Gastroesophageal reflux disease, unspecified whether esophagitis present  -     Discontinue: omeprazole (PRILOSEC) 20 MG capsule; One PO every other day for GERD  -     omeprazole (PRILOSEC) 20 MG capsule; One PO every other day for GERD    Hypertension, unspecified type    Elevated blood pressure reading without diagnosis of hypertension  -     losartan (COZAAR) 50 MG tablet; Take 0.5 tablets (25 mg total) by mouth daily        No follow-ups on file.    Romualdo Bolk FNP-C

## 2019-08-16 ENCOUNTER — Encounter: Payer: Self-pay | Admitting: Family Medicine

## 2019-08-19 ENCOUNTER — Encounter: Payer: Self-pay | Admitting: Family Medicine

## 2019-08-21 ENCOUNTER — Telehealth: Payer: Self-pay | Admitting: Family Medicine

## 2019-08-21 NOTE — Telephone Encounter (Signed)
Patient went to the lab this morning expecting to have labs drawn, per discussion at her last office visit 08/06/19, and there are no labs in the system. Is patient due for labs? She saw Doug on 08/06/19 and had been corresponding with him via [facility] since.     Please review and advise

## 2019-08-25 NOTE — Telephone Encounter (Signed)
Please express my apologies for no lab orders and ask pt. To return to lab to have blood drawn then also please schedule visit to recheck back and review blood test results.    Norina Buzzard. [facility] FNP-C  [facility]  32ffice # [phone]

## 2019-08-25 NOTE — Addendum Note (Signed)
Addended by: Romualdo Bolk on: 08/25/2019 02:36 PM     Modules accepted: Orders

## 2019-08-25 NOTE — Telephone Encounter (Signed)
Called pt and advised lab work has been ordered. Pt advised she will get that done and then call back to schedule appt with Dr. Clayborne Dana.

## 2019-09-01 ENCOUNTER — Other Ambulatory Visit: Payer: Self-pay | Admitting: Primary Care

## 2019-09-01 DIAGNOSIS — I10 Essential (primary) hypertension: Secondary | ICD-10-CM

## 2019-09-01 DIAGNOSIS — R030 Elevated blood-pressure reading, without diagnosis of hypertension: Secondary | ICD-10-CM

## 2019-09-11 ENCOUNTER — Other Ambulatory Visit: Payer: Self-pay | Admitting: Primary Care

## 2019-09-11 DIAGNOSIS — I10 Essential (primary) hypertension: Secondary | ICD-10-CM

## 2019-09-11 NOTE — Telephone Encounter (Signed)
LOV 08/06/19. No upcoming appointment

## 2019-09-21 ENCOUNTER — Other Ambulatory Visit
Admission: RE | Admit: 2019-09-21 | Discharge: 2019-09-21 | Disposition: A | Discharge status: Home / Self Care | Payer: Medicare (Managed Care) | Source: Ambulatory Visit | Attending: Family Medicine | Admitting: Family Medicine

## 2019-09-21 DIAGNOSIS — I10 Essential (primary) hypertension: Secondary | ICD-10-CM | POA: Insufficient documentation

## 2019-09-21 LAB — VITAMIN B12: Vitamin B12: 427 pg/mL (ref 232–1245)

## 2019-09-21 LAB — COMPREHENSIVE METABOLIC PANEL
ALT: 18 U/L (ref 0–35)
AST: 31 U/L (ref 0–35)
Albumin: 4.6 g/dL (ref 3.5–5.2)
Alk Phos: 73 U/L (ref 35–105)
Anion Gap: 12 (ref 7–16)
Bilirubin,Total: 0.4 mg/dL (ref 0.0–1.2)
CO2: 26 mmol/L (ref 20–28)
Calcium: 10.2 mg/dL (ref 8.6–10.2)
Chloride: 102 mmol/L (ref 96–108)
Creatinine: 1.1 mg/dL — ABNORMAL HIGH (ref 0.51–0.95)
GFR,Black: 55 * — AB
GFR,Caucasian: 48 * — AB
Glucose: 95 mg/dL (ref 60–99)
Lab: 16 mg/dL (ref 6–20)
Potassium: 4.3 mmol/L (ref 3.3–5.1)
Sodium: 140 mmol/L (ref 133–145)
Total Protein: 7.1 g/dL (ref 6.3–7.7)

## 2019-09-21 LAB — CBC AND DIFFERENTIAL
Baso # K/uL: 0.1 10*3/uL (ref 0.0–0.1)
Basophil %: 2.2 %
Eos # K/uL: 0.2 10*3/uL (ref 0.0–0.4)
Eosinophil %: 4.9 %
Hematocrit: 44 % (ref 34–45)
Hemoglobin: 14.2 g/dL (ref 11.2–15.7)
IMM Granulocytes #: 0 10*3/uL (ref 0.0–0.0)
IMM Granulocytes: 0.2 %
Lymph # K/uL: 1.4 10*3/uL (ref 1.2–3.7)
Lymphocyte %: 30.4 %
MCH: 30 pg (ref 26–32)
MCHC: 32 g/dL (ref 32–36)
MCV: 93 fL (ref 79–95)
Mono # K/uL: 0.4 10*3/uL (ref 0.2–0.9)
Monocyte %: 8.7 %
Neut # K/uL: 2.4 10*3/uL (ref 1.6–6.1)
Nucl RBC # K/uL: 0 10*3/uL (ref 0.0–0.0)
Nucl RBC %: 0 /100 WBC (ref 0.0–0.2)
Platelets: 303 10*3/uL (ref 160–370)
RBC: 4.7 MIL/uL (ref 3.9–5.2)
RDW: 13.2 % (ref 11.7–14.4)
Seg Neut %: 53.6 %
WBC: 4.5 10*3/uL (ref 4.0–10.0)

## 2019-09-21 LAB — LIPID PANEL
Chol/HDL Ratio: 2.9
Cholesterol: 208 mg/dL — AB
HDL: 72 mg/dL — ABNORMAL HIGH (ref 40–60)
LDL Calculated: 119 mg/dL
Non HDL Cholesterol: 136 mg/dL
Triglycerides: 83 mg/dL

## 2019-09-21 LAB — VITAMIN D: 25-OH Vit Total: 56 ng/mL (ref 30–60)

## 2019-09-21 LAB — SEDIMENTATION RATE, AUTOMATED: Sedimentation Rate: 9 mm/hr (ref 0–30)

## 2019-09-21 LAB — T4: T4: 6.8 ug/dL (ref 4.6–12.0)

## 2019-09-21 LAB — HEMOGLOBIN A1C: Hemoglobin A1C: 5.7 % — ABNORMAL HIGH

## 2019-09-21 LAB — IRON: Iron: 113 ug/dL (ref 34–165)

## 2019-09-21 LAB — TSH: TSH: 2.3 u[IU]/mL (ref 0.27–4.20)

## 2019-09-29 ENCOUNTER — Ambulatory Visit: Payer: Medicare (Managed Care) | Admitting: Family Medicine

## 2019-09-29 ENCOUNTER — Encounter: Payer: Self-pay | Admitting: Family Medicine

## 2019-09-29 VITALS — BP 136/88 | HR 96 | Ht 64.02 in | Wt 142.7 lb

## 2019-09-29 DIAGNOSIS — M545 Low back pain, unspecified: Secondary | ICD-10-CM

## 2019-09-29 MED ORDER — MISC. DEVICES MISC
0 refills | Status: DC
Start: 2019-09-29 — End: 2020-02-17

## 2019-09-29 NOTE — Progress Notes (Signed)
Patient ID:  [name]  25-Feb-1940    Subjective:    CC:  Follow-up (back pain)      Pt states the pain is worse since the shots with Dr. Brantley Fling.   Pt state she is not able to sleep due to the pain. She state she has not been able to move from the chair at home. She is not able to walk around without assistance. She is requesting a walker for home as she lives independently.  She states her heating pad     She is not taking any pain medications at this time because pain medications makes her dizzy and constipation.     Of note, she finds herself forgetful regarding medications at night.       [name]  has a past medical history of AAA (abdominal aortic aneurysm), Elevated LFTs, Essential hypertension (06/11/2016), GERD (gastroesophageal reflux disease), High blood pressure, HLD (hyperlipidemia), colonic polyps, Lumbar disc herniation (2006), Osteoporosis, Spondylosis, and Urinary retention.  Her family history includes Breast cancer (age of onset: 35) in her sister; Cancer in her brother, mother, and sister; Dementia (age of onset: 85) in her mother; Diabetes in her brother; Heart Disease (age of onset: 36) in her father; Heart attack in her father; High cholesterol in her brother, father, mother, and sister; Hip fracture in her sister; Parkinsonism in her father; Stroke in her mother.      Medications and allergies were reviewed and reconciled with the patient in eRecord today; and Yes changes were made.      Review of Systems   Constitutional: Negative for chills and fever.   HENT: Negative for sinus pain and sore throat.    Eyes: Negative.    Respiratory: Negative for cough.    Cardiovascular: Negative for chest pain.   Gastrointestinal: Negative.    Genitourinary: Negative.    Musculoskeletal: Positive for back pain and joint pain. Negative for falls.   Skin: Negative for rash.   Neurological: Positive for dizziness.   Endo/Heme/Allergies: Negative.    Psychiatric/Behavioral: The patient has insomnia.           Objective:    Vitals:    09/29/19 0941   BP: 136/88   Pulse: 96   Weight: 64.7 kg (142 lb 11.2 oz)   Height: 1.626 m (5' 4.02")     Physical Exam  Constitutional:       Appearance: Normal appearance.   HENT:      Head: Normocephalic.      Nose: Nose normal.   Eyes:      Pupils: Pupils are equal, round, and reactive to light.   Cardiovascular:      Rate and Rhythm: Normal rate and regular rhythm.      Heart sounds: Normal heart sounds.   Pulmonary:      Effort: Pulmonary effort is normal.   Abdominal:      General: Abdomen is flat.   Musculoskeletal:      Cervical back: Normal range of motion.      Thoracic back: Tenderness present. Decreased range of motion.      Lumbar back: Tenderness and bony tenderness present. Decreased range of motion.   Skin:     General: Skin is warm and dry.      Capillary Refill: Capillary refill takes less than 2 seconds.   Neurological:      General: No focal deficit present.      Mental Status: She is alert.   Psychiatric:  Mood and Affect: Mood normal.         No results found for this or any previous visit (from the past 24 hour(s)).    Assessment/Plan:  [name] was seen today for follow-up.    Diagnoses and all orders for this visit:    Low back pain  -     Misc. Devices MISC; Walker  -     AMB REFERRAL TO PHYSICAL / OCCUPATIONAL THERAPY      No follow-ups on file.    Physical therapy  [facility]   Encouraged to continue physical activity.  May use melatonin for sleep disturbances.      Romualdo Bolk FNP-C

## 2019-09-29 NOTE — Patient Instructions (Signed)
[facility] [address] and [address]

## 2019-10-01 ENCOUNTER — Ambulatory Visit
Payer: Medicare (Managed Care) | Attending: Family Medicine | Admitting: Rehabilitative and Restorative Service Providers"

## 2019-10-01 DIAGNOSIS — G8929 Other chronic pain: Secondary | ICD-10-CM

## 2019-10-01 DIAGNOSIS — M5442 Lumbago with sciatica, left side: Secondary | ICD-10-CM | POA: Insufficient documentation

## 2019-10-01 DIAGNOSIS — M5441 Lumbago with sciatica, right side: Secondary | ICD-10-CM | POA: Insufficient documentation

## 2019-10-01 NOTE — Progress Notes (Signed)
Sent via: eRecord EMR    Physician attestation for [facility] Plan of Care: Physician/NP/PA:    Windle Guard, NP       Per signature, I have reviewed and agree with the documented plan of care.    ___________________________________________________________    Please sign this Medicare plan of care for outpatient therapy treatment as required by Medicare. If you have any questions, please contact us at [phone]. We appreciate your prompt attention to this request.    Sincerely,   [facility] EVALUATION      10/01/2019  Diagnosis:   1. Chronic bilateral low back pain with bilateral sciatica       Onset date:  Chronic (12+wks)   She has had injections in her back, no change in her symptoms.       SUBJECTIVE    [name] is a 79 y.o. female who is present today for back care.  Mechanism of injury/history of symptoms: Repetitive strain    [name] of Symptoms  Relevant symptoms: Aching, Numbness   Symptom frequency: Constant  Symptom intensity (0 - 10 scale): Now 6 Best 5 Worst 8  Symptom location: diffuse lumbar region; travels into bilateral lateral hips  Symptoms worsen with: walking, doing housework  Symptoms improve with: Rest   Prior history of low back pain.  - YES  Assistive Device: none   Pain at night.    Previous Testing and/or Treatment  Diagnostic tests: Per report, reviewed   Previous or Concurrent Treatment: physical therapy  History spine of surgery:no      Medical Screen  Within the last 3 months, patient reports: no constitutional symptoms, no falls / trauma  History of Cancer: No  History of Smoking: Yes - quit 6 years ago    Occupation and Activities  Work status: Retired  Designer, jewellery title/type of work: NA  Stresses/physical demands of job: NA   Stresses/physical demands of home: [place], Housekeeping and Gardening/Yard Work   Physical activity level: Activity seldom  Sport/Activities: Yoga      Patients  goals for therapy: Perform all self care ADLs (bathing, hygiene, dressing, eating) independently, Increase ROM / flexibility, Increase strength    FUNCTION    Functional Status of Walking, Standing or Sitting  Patient is able to walk for < 108min  before symptoms are produced/ increased.        OBJECTIVE    Observation  Patient was able to ambulate into the department with a antalgic gait pattern.      Posture  Cervcal lordosis: decreased  Thoracic kyphosis:increased  Lumbar lordosis: decreased    Lateral Deviation: None    Palpation   Paraspinal spasm  Bilateral and very TTP over lumbar paraspinals; unable to tolerate lumbar springing    Segmental Mobility Assessment  Hypomobile    LUMBAR AROM  Flexion: minimal limitation  Extension:  Moderate limitation, discomfort  Left Sidebend: minimal limitation  Right Sidebend: minimal limitation  Left Rotation: moderate limitation  Right Rotation: minimal limitation      Neuromuscular Assessment  Sensation:  LE  Intact to gross screen  Myotomes:  LE Weak      Special Tests  Lumbar:  Slump, Right LE  negative, Left LE   negative  Straight Leg Raise,  Right LE  negative, Left LE   negative  Neurodynamic: Crossed Straight Leg Raise,  Right LE  negative, Left LE  negative  Sacroilliac Joint:  NA  Hip: NA        Flexibility:   Hamstrings: Moderate tightness  Quads: Minor tightness    Neuromuscular Control  Isometric: Ability to brace abdomen in sitting/supine -  Abdominal Activation: yes  Drawing in abdomen:  yes  Isometric: Ability to contract gluteal muscles in siiting/supine -  Glute activation:  yes  Hamstring contraction:  yes    Directional Preference Testing  Sitting Correction: symptoms reduced:  yes  Standing Correction: symptoms reduced:  no  Directional Preference: Flexion bias              ASSESSMENT  Findings consistent with 79 y.o. female with low back pain with ROM limitations, strength limitations, functional limitations.  Significant presentation and prognostic  factors include moderate symptom rating, low disability rating, and stable clinical status.   Based on clinical evaluation and assessment, the primary rehabilitation approach will include symptom modulation, movement control, functional optimization.    Personal factors affecting treatment/recovery:   Advanced age  Comorbidities affecting treatment/recovery:   none noted  Clinical presentation:   stable  Patient complexity:     low level as indicated by above stability of condition, personal factors, environmental factors and comorbidities in addition to patient symptom presentation and impairments found on physical exam.    Prognosis:  Fair    Contraindications/Precautions/Limitation:  Per diagnosis    Short Term Goals:3week(s) Decrease c/o max pain to < 4/10, Increase strength of core 25% and Minimal assistance with HEP/ education concepts   Long Term Goals:46month(s) Walk community distances without increased symptoms, Stand as long as needed when completing ADL's without increased symptoms, Patient demonstrates improved functional core strength, Patient will return to full prior level of function  , Independent with symptom management    TREATMENT PLAN  Patient/family involved in developing goals and treatment plan: Yes    Recommended Treatment Freq 1 times per week(s) for 2 month(s)    Treatment plan inclusive of:  Exercise:AROM, Stretching, Strengthening, Progressive Resistive, Aerobic exercise  Manual Techniques:N/A   Modalities:  Moist heat, Ther Exercise per flowsheet  Functional: Proprioception/Dynamic stability, Functional rehab      Thank you for the referral of this patient to [facility].    Ammie Warrick, PT    SKTC 3x 30"   DKTC 3x 30"   Active HS stretch 20x B       PPT  20x   Curl ups 15x   Bridges  20x   Clams  20x B                                            Treatment Start Time 130pm   Treatment End Time 215pm   Total Minutes of treatment 31min       Total  Non-Treatment time (rest)    Total Service Based min of treatment 4min   Total Time-Based min of treatment 33min           Service-Based Procedures/ Modalities    Evaluation 52min   Re-evaluation    Traction, mechanical    Electric stimulation (unattended)    Vasopneumatic device    Whirlpool    Total Service Based Treatment 68min       Time-Based Procedures / Modalities    Therapeutic ex 32min   Manual Therapy    Therapeutic Activities  Neuromuscular Re-ed    Physical Performance Test    Gait training, including stairs        Ultrasound    Electric stimulation (manual)    Iontophoresis    Contrast baths    Total Time-Based Treatment 55min          POC DATE: 10/01/19

## 2019-10-08 ENCOUNTER — Ambulatory Visit
Payer: Medicare (Managed Care) | Attending: Family Medicine | Admitting: Rehabilitative and Restorative Service Providers"

## 2019-10-08 DIAGNOSIS — M5442 Lumbago with sciatica, left side: Secondary | ICD-10-CM | POA: Insufficient documentation

## 2019-10-08 DIAGNOSIS — M5441 Lumbago with sciatica, right side: Secondary | ICD-10-CM | POA: Insufficient documentation

## 2019-10-08 DIAGNOSIS — G8929 Other chronic pain: Secondary | ICD-10-CM | POA: Insufficient documentation

## 2019-10-08 NOTE — Progress Notes (Signed)
[facility] Orthopedic Sports/Spine Rehabilitation  PT Treatment Note    Todays Date: 10/08/2019    Name: [name]  DOB: 1940-04-13  Referring Physician: Windle Guard, NP  Diagnosis:   1. Chronic bilateral low back pain with bilateral sciatica         Visit #: 2    Subjective:  Pain Assessment: 5  Pt reports no significant changes in pain or function since previous treatment session. She notes she is having dizziness after performing her HEP on the floor. No changes in her back symptoms this week.        Objective:  Strength - Therapeutic Exercises per flowsheet  Function: - Unchanged  Education:  Updated HEP, Verbal cues for ther ex, Joint ed concepts, Spine ed concepts    Objective        Treatment:  Ther Exercise per flowsheet    Assessment:   Reviewed her HEP and answered all questions to the patients' satisfaction. Discussed trying her HEP in her bed to avoid the dizziness when getting off the floor.        Plan of Care:  Continue per Plan of care -  As written; Patient would benefit from skilled rehabilitation services to address the above impairments to restore functional capacity.    Thank you for referring this patient to [place] [facility]    Quintara Bost Jake Michaelis, PT      Bike 25min  SKTC 3x 30"   DKTC 3x 30"   Active HS stretch 20x B       Gluteal iso 20x   PPT  20x   Curl ups 15x   Bridges  20x   Clams  20x B       Seated marches 30x   Seated trunk flexion 5x 10"                                Treatment Start Time 1030am   Treatment End Time 1100am   Total Minutes of treatment 34min       Total Non-Treatment time (rest)    Total Service Based min of treatment    Total Time-Based min of treatment 16min           Service-Based Procedures/ Modalities    Evaluation    Re-evaluation    Traction, mechanical    Electric stimulation (unattended)    Vasopneumatic device    Whirlpool    Total Service Based Treatment        Time-Based Procedures /  Modalities    Therapeutic ex 55min   Manual Therapy    Therapeutic Activities    Neuromuscular Re-ed    Physical Performance Test    Gait training, including stairs        Ultrasound    Electric stimulation (manual)    Iontophoresis    Contrast baths    Total Time-Based Treatment 28min          POC DATE: 10/01/19

## 2019-10-15 ENCOUNTER — Ambulatory Visit: Payer: Medicare (Managed Care) | Admitting: Rehabilitative and Restorative Service Providers"

## 2019-10-15 DIAGNOSIS — M5441 Lumbago with sciatica, right side: Secondary | ICD-10-CM

## 2019-10-15 DIAGNOSIS — M5442 Lumbago with sciatica, left side: Secondary | ICD-10-CM

## 2019-10-15 NOTE — Progress Notes (Signed)
[facility] Orthopedic Sports/Spine Rehabilitation  PT Treatment Note    Todays Date: 10/15/2019    Name: [name]  DOB: May 12, 1940  Referring Physician: Windle Guard, NP  Diagnosis:   1. Chronic bilateral low back pain with bilateral sciatica         Visit #: 3    Subjective:  Pain Assessment: 5  Pt reports no significant changes in pain or function since previous treatment session. She continues to have significant pain in her back and hips. This limits her ability to walk for any duration of time.        Objective:  Strength - Therapeutic Exercises per flowsheet  Function: - Unchanged  Education:  Updated HEP, Verbal cues for ther ex, Joint ed concepts, Spine ed concepts    Objective        Treatment:  Ther Exercise per flowsheet    Assessment:   Patient was able to tolerate exercises with good results. Slight relief noted with flexion based core strengthening although short term.        Plan of Care:  Continue per Plan of care -  As written; Patient would benefit from skilled rehabilitation services to address the above impairments to restore functional capacity.    Thank you for referring this patient to [place] [facility]    Ruperto Kiernan Jake Michaelis, PT      Bike 20min  SKTC 3x 30"   DKTC 3x 30"   Active HS stretch 20x B   LTR 10x       Gluteal iso 20x   PPT  20x   Curl ups 15x   Bridges  20x   Clams  20x B       Seated marches 30x   Seated trunk flexion 5x 10"       Standing gluteal iso 10x   Mini squats 15x                    Treatment Start Time 900am   Treatment End Time 930am   Total Minutes of treatment 65min       Total Non-Treatment time (rest)    Total Service Based min of treatment    Total Time-Based min of treatment 69min           Service-Based Procedures/ Modalities    Evaluation    Re-evaluation    Traction, mechanical    Electric stimulation (unattended)    Vasopneumatic device    Whirlpool    Total Service Based Treatment         Time-Based Procedures / Modalities    Therapeutic ex 59min   Manual Therapy    Therapeutic Activities    Neuromuscular Re-ed    Physical Performance Test    Gait training, including stairs        Ultrasound    Electric stimulation (manual)    Iontophoresis    Contrast baths    Total Time-Based Treatment 35min          POC DATE: 10/01/19

## 2019-10-22 ENCOUNTER — Ambulatory Visit: Payer: Medicare (Managed Care) | Admitting: Rehabilitative and Restorative Service Providers"

## 2019-10-22 DIAGNOSIS — G8929 Other chronic pain: Secondary | ICD-10-CM

## 2019-10-22 DIAGNOSIS — M5442 Lumbago with sciatica, left side: Secondary | ICD-10-CM

## 2019-10-22 DIAGNOSIS — M5441 Lumbago with sciatica, right side: Secondary | ICD-10-CM

## 2019-10-22 NOTE — Progress Notes (Signed)
[facility] Orthopedic Sports/Spine Rehabilitation  PT Treatment Note    Todays Date: 10/22/2019    Name: [name]  DOB: 09/06/1940  Referring Physician: Windle Guard, NP  Diagnosis:   1. Chronic bilateral low back pain with bilateral sciatica         Visit #: 4    Subjective:  Pain Assessment: 5  Pt reports no significant changes in pain or function since previous treatment session. She continues to have discomfort in across the low back. Worse with standing and housework. Relief comes with sitting and her stretches.        Objective:  Strength - Therapeutic Exercises per flowsheet  Function: - Unchanged  Education:  Updated HEP, Verbal cues for ther ex, Joint ed concepts, Spine ed concepts    Objective        Treatment:  Ther Exercise per flowsheet    Assessment:   Patient is able to tolerate exercises with good results but this is short lived. Reviewed importance of performing exercises throughout the day to help manage her symptoms.        Plan of Care:  Continue per Plan of care -  As written; Patient would benefit from skilled rehabilitation services to address the above impairments to restore functional capacity.    Thank you for referring this patient to [place] [facility]    Breyona Swander Jake Michaelis, PT      Bike 56min  SKTC 3x 30"   DKTC 3x 30"   Active HS stretch 20x B   LTR 10x       Gluteal iso 20x   PPT  20x   Curl ups 15x   Bridges  20x   Clams  20x B       Seated marches 30x   Seated trunk flexion 5x 10"       Standing gluteal iso 10x   Mini squats 15x                    Treatment Start Time 900am   Treatment End Time 930am   Total Minutes of treatment 56min       Total Non-Treatment time (rest)    Total Service Based min of treatment    Total Time-Based min of treatment 69min           Service-Based Procedures/ Modalities    Evaluation    Re-evaluation    Traction, mechanical    Electric stimulation (unattended)    Vasopneumatic device     Whirlpool    Total Service Based Treatment        Time-Based Procedures / Modalities    Therapeutic ex 60min   Manual Therapy    Therapeutic Activities    Neuromuscular Re-ed    Physical Performance Test    Gait training, including stairs        Ultrasound    Electric stimulation (manual)    Iontophoresis    Contrast baths    Total Time-Based Treatment 79min          POC DATE: 10/01/19

## 2019-10-29 ENCOUNTER — Ambulatory Visit: Payer: Medicare (Managed Care) | Admitting: Rehabilitative and Restorative Service Providers"

## 2019-10-29 DIAGNOSIS — M5442 Lumbago with sciatica, left side: Secondary | ICD-10-CM

## 2019-10-29 NOTE — Progress Notes (Signed)
[facility] Orthopedic Sports/Spine Rehabilitation  PT Treatment Note    Todays Date: 10/29/2019    Name: [name]  DOB: Aug 29, 1940  Referring Physician: Windle Guard, NP  Diagnosis:   1. Chronic bilateral low back pain with bilateral sciatica         Visit #: 4    Subjective:  Pain Assessment: 5  Pt reports no significant changes in pain or function since previous treatment session. She continues to have moderate low back pain after performing any standing or walking activity. She only gets short term relief from her stretching.        Objective:  Strength - Therapeutic Exercises per flowsheet  Function: - Unchanged  Education:  Updated HEP, Verbal cues for ther ex, Joint ed concepts, Spine ed concepts    Objective        Treatment:  Ther Exercise per flowsheet    Assessment:   Patient is confident with her HEP and understands the importance of performing this on a consistent basis. She is will follow up as needed after her visit with her MD.       Plan of Care:  Continue per Plan of care -  As written; Patient would benefit from skilled rehabilitation services to address the above impairments to restore functional capacity.    Thank you for referring this patient to [place] [facility]    Panfilo Ketchum Jake Michaelis, PT      Bike 38min  SKTC 3x 30"   DKTC 3x 30"   Active HS stretch 20x B   LTR 10x       Gluteal iso 20x   PPT  20x   Curl ups 15x   Bridges  20x   Clams  20x B       Seated marches 30x   Seated trunk flexion 5x 10"       Standing gluteal iso 10x   Mini squats 15x                    Treatment Start Time 930am   Treatment End Time 1000am   Total Minutes of treatment 32min       Total Non-Treatment time (rest)    Total Service Based min of treatment    Total Time-Based min of treatment 40min           Service-Based Procedures/ Modalities    Evaluation    Re-evaluation    Traction, mechanical    Electric stimulation (unattended)    Vasopneumatic  device    Whirlpool    Total Service Based Treatment        Time-Based Procedures / Modalities    Therapeutic ex 22min   Manual Therapy    Therapeutic Activities    Neuromuscular Re-ed    Physical Performance Test    Gait training, including stairs        Ultrasound    Electric stimulation (manual)    Iontophoresis    Contrast baths    Total Time-Based Treatment 1min          POC DATE: 10/01/19

## 2019-11-06 ENCOUNTER — Ambulatory Visit: Payer: Medicare (Managed Care) | Admitting: Family Medicine

## 2019-11-06 ENCOUNTER — Encounter: Payer: Self-pay | Admitting: Family Medicine

## 2019-11-06 ENCOUNTER — Other Ambulatory Visit: Payer: Self-pay | Admitting: Family Medicine

## 2019-11-06 VITALS — BP 118/66 | HR 64 | Ht 64.02 in | Wt 143.5 lb

## 2019-11-06 DIAGNOSIS — I10 Essential (primary) hypertension: Secondary | ICD-10-CM

## 2019-11-06 DIAGNOSIS — M4807 Spinal stenosis, lumbosacral region: Secondary | ICD-10-CM

## 2019-11-06 DIAGNOSIS — Z1231 Encounter for screening mammogram for malignant neoplasm of breast: Secondary | ICD-10-CM

## 2019-11-06 DIAGNOSIS — K219 Gastro-esophageal reflux disease without esophagitis: Secondary | ICD-10-CM

## 2019-11-06 DIAGNOSIS — Z0000 Encounter for general adult medical examination without abnormal findings: Secondary | ICD-10-CM

## 2019-11-06 MED ORDER — LOSARTAN POTASSIUM 50 MG PO TABS *I*
25.0000 mg | ORAL_TABLET | Freq: Every day | ORAL | 1 refills | Status: DC
Start: 2019-11-06 — End: 2019-11-06

## 2019-11-06 NOTE — Progress Notes (Signed)
Visit performed as:             Office Visit, met with patient in person    Today we reviewed and updated [name] smoking status, activities of daily living, depression screen, fall risk, medications and allergies.   I have counseled the patient in the above areas.     Subjective:     Chief Complaint: [name] is a 79 y.o. female here for a/an Subsequent Annual Medicare Visit    In general, [name] rates their overall health as:  poor     HTN - Taking medications (amlodipine, HCTZ, losartan) as prescribed. BP 118/66. Does get dizzy spells at times. Checks BP at home.      CAD, HLD - Simvastatin 20 mg daily.     GERD - Symptoms controlled with omeprazole, started in August. Takes every other day. However has noticed increased belching, bloating. Had abdominal US done in April which showed no acute findings.     Spinal stenosis, low back pain - Symptoms seem to be worsening. PT has not helped. Had injections with Dr. Karle Starch which didn't seem to help either.  Gave relief for a day or so then wore off. Has not seen neurosurgery in the past.   Has been more sedentary since pandemic began. Has gradually be worsening since.  Finds it hard to cook, clean, shower, do activities due to pain.   Pain goes from lower back into legs.   Activity level: Minimal now due to severity of symptoms.   Can't take oxycodone - had nausea and vomiting.   Was on tramadol previously - years ago. Had GI side effects - stomach cramps.       Patient Care Team:  Tillie Fantasia, MD as PCP - General (Family Medicine)  Roselie Awkward, MD as Consulting Provider (Cardiology)  Briant Sites, MD as Consulting Provider (Physical Medicine and Rehabilitation)  Judithann Graves, Joie Bimler, MD as Consulting Provider (Vascular Surgery)     Current Outpatient Medications on File Prior to Visit   Medication Sig Dispense Refill    Misc. Devices MISC Walker 1 each 0    hydroCHLOROthiazide (HYDRODIURIL) 25 MG tablet TAKE 1 TABLET BY MOUTH  EVERY MORNING 90 tablet 0    amLODIPine (NORVASC) 5 MG tablet TAKE 1 TABLET BY MOUTH EVERY DAY 90 tablet 1    acetaminophen (TYLENOL) 325 MG tablet Take by mouth every 6 hours as needed for Pain      omeprazole (PRILOSEC) 20 MG capsule One PO every other day for GERD 30 capsule 5    simvastatin (ZOCOR) 20 MG tablet TAKE 1 TABLET BY MOUTH EVERY DAY WITH DINNER 90 tablet 1    Calcium Carbonate-Vitamin D (CALCIUM 500 + D PO) Take 1 tablet by mouth 2 times daily      aspirin 81 MG tablet Take 1 tablet (81 mg total) by mouth daily 90 tablet 1     No current facility-administered medications on file prior to visit.     Allergies   Allergen Reactions    Oxycodone Nausea And Vomiting     When in hospital for AAA repair    Environmental Allergies Other (See Comments)     Nasal congestion    Lipitor [Atorvastatin] Other (See Comments)     Elevated LFTs     Patient Active Problem List    Diagnosis Date Noted    Essential hypertension 06/11/2016     Priority: High    Osteoporosis 10/15/2013  Priority: High     Fosamax started 2015?      Low back pain 10/15/2013     Priority: High       Saw Dr Karle Starch.         Benign neoplasm of colon 11/03/2013     Priority: Medium     Removed via colonscopy 2012; told to repeat 2017       AAA (abdominal aortic aneurysm) 10/15/2013     Priority: Medium     Due for repeat U/S 09/2016 ; Noted 06/02/13: 3.8X3.5 cm partially thrombosed; 03/2016 stable at 4.77 cm- continued surveillance with vascular surgery                Hyperlipidemia 10/15/2013     Priority: Medium    Wears dentures 06/11/2016     Priority: Low    Patient has healthcare proxy 06/11/2016     Priority: Low     [name] (son), [name] (sister)       Gallstones 10/23/2013     Priority: Low     Noted U/S: 06/02/13: There is gallbladder sludge/crushed gallstones. No gallbladder wall thickening, no sonographic Murphy's sign. Common bile duct 30m.       GERD (gastroesophageal reflux disease) 10/15/2013      Priority: Low    Allergic rhinitis 11/21/2017    Positive colorectal cancer screening using Cologuard test 09/25/2017    Claustrophobia 09/25/2017    AAA (abdominal aortic aneurysm) without rupture - s/p EVAR & Right CFA endarterectomy 11/14/16 10/26/2016     Past Medical History:   Diagnosis Date    AAA (abdominal aortic aneurysm)     Elevated LFTs     Unclear if this was related to statin, gallstones, alcohol consumption     Essential hypertension 06/11/2016    GERD (gastroesophageal reflux disease)     High blood pressure     HLD (hyperlipidemia)     Hx of colonic polyps     Colonscopy 08/11/10; repeat 2017 per GI     Lumbar disc herniation 2006    L5-S1    Osteoporosis     Spondylosis     Urinary retention      Past Surgical History:   Procedure Laterality Date    cataract repair Bilateral     COLONOSCOPY  2011    HYSTERECTOMY      left ovaries in place     TUBAL LIGATION       Family History   Problem Relation Age of Onset    Dementia Mother 730   Cancer Mother         lung    High cholesterol Mother     Stroke Mother     Parkinsonism Father     Heart Disease Father 856       passed    High cholesterol Father     Heart attack Father     Cancer Sister         Breast; in recovery    High cholesterol Sister     Hip fracture Sister     Breast cancer Sister 639   Cancer Brother         prostate    High cholesterol Brother     Diabetes Brother     Aneurysm Neg Hx     Anesthesia problems Neg Hx     Colon cancer Neg Hx     Colon polyps Neg Hx     Ovarian cancer  Neg Hx      Social History     Socioeconomic History    Marital status: Divorced     Spouse name: Not on file    Number of children: Not on file    Years of education: Not on file    Highest education level: Not on file   Occupational History    Not on file   Tobacco Use    Smoking status: Former Smoker     Packs/day: 1.00     Years: 30.00     Pack years: 30.00     Types: Cigarettes     Quit date: 06/15/2013     Years  since quitting: 6.3    Smokeless tobacco: Former Systems developer   Substance and Sexual Activity    Alcohol use: Yes     Alcohol/week: 9.0 standard drinks     Types: 9 Glasses of wine per week    Drug use: No    Sexual activity: Not Currently   Social History Narrative    Lives alone currently in townhome     2 brothers and 2 sisters that live here    2 boys ([place], [place])    Grandchildren        Objective:     Vital Signs: BP 118/66    Pulse 64    Ht 1.626 m (5' 4.02")    Wt 65.1 kg (143 lb 8 oz)    LMP  (LMP Unknown)    BMI 24.62 kg/m    BMI: Body mass index is 24.62 kg/m.    Vision Screening Results (Welcome visit only):  No exam data present    Depression Screening Results:  Recent Review Flowsheet Data     PHQ Scores 08/04/2018 06/17/2018 06/21/2017 02/15/2017 01/19/2016 12/22/2015    PSQ2 Q1 - Interest/Pleasure Y  Y Y  N N N    PSQ2 Q2 - Down, Depressed, Hopeless Y  Y Y N N N    PHQ Q9 - Better Off Dead - 0 - - - -    PHQ Calculated Score - 17 - - - -    PHQ Manual Score - 17 - - - -        Opioid Use/DAST- 10 Screening Results:   No data recorded  Activities of Daily Living/Functional Screening Results:  Is the person deaf or does he/she have serious difficulty hearing?: N  Is this person blind or does he/she have serious difficulty seeing even when wearing glasses?: N  *Vision Status: Visual aid   Does this person have serious difficulty walking or climbing stairs?: N  *Shopping: Independent  *House Keeping: Independent  *Managing Own Medications: Independent  *Handling Finances: Independent  Difficulty doing errands due to a physicial, mental or emotional condition: No  Difficulty remembering or making decisions due to a physicial, mental or emotional condition: No      Fall Risk Screening Results:  Have you fallen in the last year?: No  Do you feel you are at risk for falling?: No  Do you feel your risk of falling is: : Low  Would you like to learn more about how to reduce your risk of falling?:  No      Assessment and Plan:     Cognitive Function:  Recall of recent and remote events appears:  Normal      Advanced Care Planning:  was discussed and the paperwork can be found in the scanned media section . Previously done.  The following health maintenance plan was reviewed with the patient:    Health Maintenance Topics with due status: Overdue       Topic Date Due    HIV Screening USPSTF/[place] Never done    IMM-ZOSTER Never done    Lung Cancer Screening USPSTF Never done    DEPRESSION SCREEN MONTHLY 09/04/2018     Health Maintenance Topics with due status: Not Due       Topic Last Completion Date    Fall Risk Screening 11/06/2019     Health Maintenance Topics with due status: Completed       Topic Last Completion Date    IMM-PREVNAR VACCINE 65 + YRS 10/15/2013    IMM-PNEUMOVAX VACCINE 65 + YRS 12/10/2014    IMM-INFLUENZA 09/08/2019    COVID-19 Vaccine 11/03/2019     This health maintenance schedule, identified risks, a list of orders placed today and patient goals have been provided to [name] in the after visit summary.     Plan for any concerns identified during screening or risk assessments:  N/a    1. Preventative health care  Per above.   Declined lung cancer screening today.     2. Hypertension, unspecified type  BP well controlled, below goal. Will discontinue losartan as it is bothersome to cut in half and see how BPs are. Advised to check daily, input into MyChart flowsheet weekly. Call with concerns.   - MyChart Blood Pressure Flowsheet    3. Gastroesophageal reflux disease, unspecified whether esophagitis present  Stop omeprazole.  Can use pepcid prn for reflux symptoms, counseled she may have some rebound reflux.  Will see how belching symptoms are after she is off omeprazole, I am suspicious for some delayed gastric emptying contributing given that symptoms started after restarting PPI.     4. Spinal stenosis of lumbosacral region  Chronic, not at goal. No benefit from injections. Cannot  tolerate oxycodone or tramadol due to GI side effects. Will refer to pain medicine for evaluation, input on additional treatment options. Patient not interested in surgical interventions.   - AMB REFERRAL TO PAIN MEDICINE      Tillie Fantasia, MD   Family Medicine

## 2019-11-06 NOTE — Patient Instructions (Addendum)
You were referred to pain medicine today for discussion of treatment options for your back.     Continue taking your statin and baby aspirin.   Ask your vascular surgeon about the baby aspirin.    Stop taking losartan.   Continue hydrochlorothiazide and amlodipine.  Check your blood pressures at home, call our office if consistently > 140/90.   Can put readings into MyChart once a week.     Try stopping omeprazole.   Can use pepcid (available over the counter at the pharmacy) for break through symptoms.   Try gas x for belching, but this may get better once off omeprazole.       Thank you for completing your Subsequent Annual Medicare Visit   with [place] today.     The purpose of this visits was to:     Screen for disease   Assess risk of future medical problems   Help develop a healthy lifestyle   Update vaccines   Get to know your doctor in case of an illness    Patient Care Team:  Tillie Fantasia, MD as PCP - General (Family Medicine)  Roselie Awkward, MD as Consulting Provider (Cardiology)  Briant Sites, MD as Consulting Provider (Physical Medicine and Rehabilitation)     Medicare 5 Year Plan    The following items were identified as areas of concern during your screening today:  High Blood Pressure (Hypertension) - This is a risk factor for Heart Attack, Stroke, Kidney Problems and Eye Problems.       The Health Maintenance table below identifies screening tests and immunizations recommended by your health care team:  Health Maintenance: These screening recommendations are based on USPSTF, [employer], and [place] state guidelines   Topic Date Due    HIV Screening  Never done    Shingles Vaccine (1 of 2) Never done    Lung Cancer Screening  Never done    DEPRESSION SCREEN MONTHLY  09/04/2018    Fall Risk Screening  11/05/2020    Flu Shot  Completed    Adult Prevnar Vaccination  Completed    Adult Pneumovax Vaccine  Completed    COVID-19 Vaccine  Completed     In addition, goals and orders  placed to address these recommendations are listed in the "Today's Visit" section.    We wish you the best of health and look forward to seeing you again next year for your Annual Medicare Wellness Visit.     If you have any health care concerns before then, please do not hesitate to contact us.

## 2019-11-09 ENCOUNTER — Encounter: Payer: Medicare (Managed Care) | Admitting: Pain Medicine

## 2019-11-10 ENCOUNTER — Ambulatory Visit: Payer: Medicare (Managed Care)

## 2019-11-20 ENCOUNTER — Ambulatory Visit
Admission: RE | Admit: 2019-11-20 | Discharge: 2019-11-20 | Disposition: A | Discharge status: Home / Self Care | Payer: Medicare (Managed Care) | Source: Ambulatory Visit | Attending: Family Medicine | Admitting: Family Medicine

## 2019-11-20 DIAGNOSIS — Z1231 Encounter for screening mammogram for malignant neoplasm of breast: Secondary | ICD-10-CM | POA: Insufficient documentation

## 2019-11-20 DIAGNOSIS — Z803 Family history of malignant neoplasm of breast: Secondary | ICD-10-CM

## 2019-11-30 ENCOUNTER — Other Ambulatory Visit: Payer: Self-pay | Admitting: Family Medicine

## 2019-11-30 ENCOUNTER — Ambulatory Visit: Payer: Medicare (Managed Care) | Admitting: Pain Medicine

## 2019-11-30 ENCOUNTER — Other Ambulatory Visit: Payer: Self-pay | Admitting: Primary Care

## 2019-11-30 ENCOUNTER — Encounter: Payer: Self-pay | Admitting: Pain Medicine

## 2019-11-30 VITALS — BP 151/73 | HR 71 | Temp 97.2°F | Resp 15 | Ht 64.02 in | Wt 144.0 lb

## 2019-11-30 DIAGNOSIS — I10 Essential (primary) hypertension: Secondary | ICD-10-CM

## 2019-11-30 DIAGNOSIS — E7800 Pure hypercholesterolemia, unspecified: Secondary | ICD-10-CM

## 2019-11-30 DIAGNOSIS — Z006 Encounter for examination for normal comparison and control in clinical research program: Secondary | ICD-10-CM

## 2019-11-30 DIAGNOSIS — M48062 Spinal stenosis, lumbar region with neurogenic claudication: Secondary | ICD-10-CM

## 2019-11-30 NOTE — Progress Notes (Signed)
[facility] - Initial Consultation Note    This is a confidential report written only for the purpose of professional communication. Clients who wish to receive these findings are requested to contact the Pain Treatment Center at [phone].     [name] is a 79 y.o. year old female.  DOB: 06-18-1940  Primary Care Physician: Tillie Fantasia, MD  Consult requested by: Tillie Fantasia, MD    Primary pain complaint: Low back and bilateral leg pain    Interval history:  [name] is a pleasant 79 year old woman who presents today with low back and bilateral leg symptoms.  Her pain begins in her low back radiates into her bilateral legs.  The pain begins after prolonged standing or with ambulation.  The pain is described as aching burning pain.  The pain is impacting her quality life and ability to participate activities of daily living such as cooking cleaning and household chores.  The pain begins in the low back radiates into the bilateral thighs.  There is associated numbness and tingling.    She does have a lumbar MRI.  It does indicate central canal stenosis at L3 and L4 secondary to ligamentum flavum hypertrophy.      Treatment History:  Medications tried: Acetaminophen, ibuprofen, naproxen, gabapentin, tramadol, oxycodone.   Physical therapy: Yes (she has had three different sessions. Most recently from 10/01/19 to 10/29/19).   Aquatic therapy: No  Massage therapy: No  TENS: Yes  Acupuncture: No  Chiropractic: No  Interventional procedures: Yes. Bilateral L5 TFESI on 07/21/19 and bilateral S1 TFESI on 06/16/19 (both with Dr. Karle Starch).       Past Medical History:   Diagnosis Date    AAA (abdominal aortic aneurysm)     Chronic pain     Elevated LFTs     Unclear if this was related to statin, gallstones, alcohol consumption     Essential hypertension 06/11/2016    GERD (gastroesophageal reflux disease)     High blood pressure     HLD (hyperlipidemia)     Hx of colonic polyps      Colonscopy 08/11/10; repeat 2017 per GI     Lumbar disc herniation 2006    L5-S1    Osteoporosis     Spondylosis     Urinary retention        Past Surgical History:   Procedure Laterality Date    cataract repair Bilateral     COLONOSCOPY  2011    HYSTERECTOMY      left ovaries in place     TUBAL LIGATION         Family History   Problem Relation Age of Onset    Dementia Mother 42    Cancer Mother         lung    High cholesterol Mother     Stroke Mother     Parkinsonism Father     Heart Disease Father 46        passed    High cholesterol Father     Heart attack Father     Cancer Sister         Breast; in recovery    High cholesterol Sister     Hip fracture Sister     Breast cancer Sister 87    Cancer Brother         prostate    High cholesterol Brother     Diabetes Brother     Aneurysm Neg Hx  Anesthesia problems Neg Hx     Colon cancer Neg Hx     Colon polyps Neg Hx     Ovarian cancer Neg Hx        Social History     Socioeconomic History    Marital status: Divorced     Spouse name: Not on file    Number of children: Not on file    Years of education: Not on file    Highest education level: Not on file   Occupational History    Not on file   Tobacco Use    Smoking status: Former Smoker     Packs/day: 1.00     Years: 30.00     Pack years: 30.00     Types: Cigarettes     Quit date: 06/15/2013     Years since quitting: 6.4    Smokeless tobacco: Former Systems developer   Substance and Sexual Activity    Alcohol use: Yes     Alcohol/week: 14.0 standard drinks     Types: 14 Glasses of wine per week     Comment: 2/day    Drug use: No    Sexual activity: Not Currently   Social History Narrative    Lives alone currently in townhome     2 brothers and 2 sisters that live here    2 boys ([place], [place])    Grandchildren        Allergies:   Allergies   Allergen Reactions    Oxycodone Nausea And Vomiting     When in hospital for AAA repair    Environmental Allergies Other (See Comments)      Nasal congestion    Lipitor [Atorvastatin] Other (See Comments)     Elevated LFTs       Current Medications:    Current Outpatient Medications   Medication    Misc. Devices MISC    hydroCHLOROthiazide (HYDRODIURIL) 25 MG tablet    amLODIPine (NORVASC) 5 MG tablet    simvastatin (ZOCOR) 20 MG tablet    Calcium Carbonate-Vitamin D (CALCIUM 500 + D PO)    aspirin 81 MG tablet    acetaminophen (TYLENOL) 325 MG tablet     No current facility-administered medications for this visit.       Review of Systems: A comprehensive review of systems was performed. Please refer to the scanned intake form for details.      Diagnostics / Imaging:  05/06/19: Lumbar spine CT w/o contrast     For purposes of numeration, 5 lumbar vertebral segments are designated. Mild levocurvature of the thoracolumbar spine is observed, with leftward apex at L1-L2. Straightening of the lumbar lordosis is present. The heights of the vertebral bodies are preserved. No acute lumbosacral fracture is identified. 2 mm retrolisthesis of L1 on L2 is noted. Severe intervertebral disc space narrowing is observed at L3-L4 through L5-S1.     Review of the individual levels reveals the following:     T12-L1: No disc herniation, spinal stenosis or foraminal narrowing.     L1-L2: A disc bulge and vertebral osteophytes deform the ventral thecal sac. Bilateral facet hypertrophy and ligamentum flava thickening. No spinal stenosis. Mild right foraminal narrowing.     L2-L3: A disc bulge deforms the ventral thecal sac. Bilateral facet hypertrophy and ligamentum flava thickening. Mild spinal stenosis. No foraminal narrowing.     L3-L4: A disc bulge and vertebral osteophytes efface the ventral thecal sac. Bilateral facet hypertrophy and ligamentum flava thickening. Moderate spinal stenosis.  Mild left foraminal narrowing.     L4-L5: A disc bulge and vertebral osteophytes efface the ventral thecal sac. Severe spinal stenosis and bilateral lateral recess stenoses.  Mild-moderate bilateral foraminal narrowing.     L5-S1: A disc bulge and vertebral osteophytes efface the ventral epidural fat. Bilateral facet hypertrophy and ligamenta flava thickening. Mild spinal stenosis. Severe right and moderate left foraminal narrowing.     An aortoiliac endograft stent is in place for the aortic aneurysm. 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       Physical Exam:  BP 151/73 (BP Location: Left arm, Patient Position: Sitting, Cuff Size: adult)    Pulse 71    Temp 36.2 C (97.2 F) (Temporal)    Resp 15    Ht 1.626 m (5' 4.02")    Wt 65.3 kg (144 lb)    LMP  (LMP Unknown)    SpO2 99%    BMI 24.70 kg/m   General: Pleasant and cooperative with an appropriate affect. No acute distress. Well nourished and well developed.   HEENT: Normocephalic without signs of trauma or gross abnormalities.   Heart / Lungs: Normal chest circumference with full respiratory excursions. No oral cyanosis or pursed lip breathing.   Musculoskeletal: Gait is non antalgic without the use of ambulatory aids. No scars, spasm, or deformity of the lumbar spine, pelvis, or lower extremities. No scoliosis or kyphosis. AROM of the lumbar spine is full with pain reproduction in extension. Muscle strength testing of all the major muscle groups of the lower extremities is 5+/5+ equal and intact bilaterally.   Neurologic: Cranial nerves II-XII are grossly intact. Alert and oriented X 3. Patellar and achilles DTRs are 2+/4+ equal and intact bilaterally. Sensation to light touch in all the major dermatomes of the lower extremities is equal and intact bilaterally.       Assessment:  [name] was seen in conjunction with Dr. Signe Colt. She is a patient with both low back and leg pain from lumbar spinal stenosis with neurogenic claudication.  She has already failed conservative care and epidural steroid injections.  I have personally reviewed the results of her lumbar MRI.  There is  evidence of central canal stenosis at L3 and L4 secondary to ligamentum flavum hypertrophy greater than 2.5 mm.  Given her failure conservative measures I think she is an ideal candidate for minimally invasive lumbar decompression.  Currently the pain is impacting her quality life and ability to participate in activities of daily living such as cooking cleaning and household chores.    We discussed a MILD procedure at L3/4 and L4/5 and she wished to proceed.     The mild procedure is a percutaneous, minimally invasive alternative to open or endoscopic lumbar decompression surgery to treat lumbar spinal stenosis (LSS) with neurogenic claudication. It is a clinically proven, safe, and effective procedure to decompress the spinal canal by removing small portions of lamina and hypertrophic ligamentum flavum, leaving the structural stability of the spine intact. Removing these elements restores space in the spinal canal and decreases the compression of the nerves, which reduces pain and restores mobility.     Medical Necessity:  The pain began on 2018.  The pain is currently rated 8/10 at worst.  The pain has been refractory to conservative measures including:  - More than three months of conservative treatment with rest.  - More than three months of reduced activity.   - Trial of physical  therapy/chiropractic care, Dates: 5/21 - 11/21 with [facility].  - Home exercise program utilizing exercises learned in prior physical therapy sessions is currently ongoing on a daily basis.  - Trial of medications including: ibuprofen, naproxen, tylenol, flexeril.  - Patient has failed to respond to conservative therapy including all of the following >6 weeks chiropractic, physical therapy and prescribed home exercise program  and NSAID > 3 weeks  and > 6 weeks activity modification.  -The procedure will be completed with the aid of fluoroscopic guidance.  -Impact on activities of daily living: The pain is significantly impacting quality life  and ability to participate in activities of daily living including; cooking, cleaning, household chores, pursuit of hobbies and gainful employment.          Plan:  1) L3/4 and L4/5 MILD.       Authored by: Vivien Rossetti, PA-C        I have reviewed the resident / fellow / PA / NP note above and was present for key components of the visit. I have edited the note as appropriate.     Audie Clear. Makyna Niehoff, M.D.  Chief, Division of Pain Medicine  Director, [place] [facility]    Associate Professor  [place] [facility]  [address] [place], [place] [address]  Office: [phone]    Direct: [phone]   Fax: [phone]

## 2019-11-30 NOTE — Telephone Encounter (Signed)
LOV 11/06/19. NOV 12/04/19

## 2019-12-01 MED ORDER — SIMVASTATIN 20 MG PO TABS *I*
20.0000 mg | ORAL_TABLET | Freq: Every day | ORAL | 1 refills | Status: DC
Start: 2019-12-01 — End: 2020-06-02

## 2019-12-04 ENCOUNTER — Ambulatory Visit: Payer: Medicare (Managed Care) | Admitting: Family Medicine

## 2019-12-04 ENCOUNTER — Encounter: Payer: Self-pay | Admitting: Family Medicine

## 2019-12-04 VITALS — BP 130/84 | HR 76 | Ht 64.17 in | Wt 144.0 lb

## 2019-12-04 DIAGNOSIS — M48062 Spinal stenosis, lumbar region with neurogenic claudication: Secondary | ICD-10-CM

## 2019-12-04 DIAGNOSIS — I10 Essential (primary) hypertension: Secondary | ICD-10-CM

## 2019-12-04 DIAGNOSIS — I951 Orthostatic hypotension: Secondary | ICD-10-CM

## 2019-12-04 MED ORDER — KETOROLAC TROMETHAMINE 30 MG/ML IJ SOLN *I*
30.0000 mg | Freq: Once | INTRAMUSCULAR | Status: AC
Start: 2019-12-04 — End: 2019-12-04
  Administered 2019-12-04: 30 mg via INTRAMUSCULAR

## 2019-12-04 NOTE — Progress Notes (Signed)
[facility]  Outpatient Progress Note     Date:  12/06/2019  Name:  [name]   MRN:  [id number]  DOB:  Dec 04, 1940   Assessment and Plan     [name] is a 79 y.o. female presenting for follow up.     1. Primary hypertension  2. Orthostatic hypotension  Continue chlorthalidone and amlodipine. Continue off losartan.   Blood pressure improved to goal range.   Monitor symptoms of orthostatic hypotension.    3. Spinal stenosis of lumbar region with neurogenic claudication  Appreciate care and recommendations from Dr. Signe Colt. Patient will proceed with lumbar decompression when able. Unfortunately she has experienced reactions to many medications commonly used for pain management. Continue prn tylenol. Trial toradol injection today.  - ketorolac (TORADOL) 30 mg/mL injection 30 mg        F/U: Return in about 3 months (around 03/03/2020) for Follow-up HTN, back pain.     SUBJECTIVE     Chief Complaint   Patient presents with    Medication Monitoring       Patient states her back pain has been persistent, mildly worsened. Has difficulty with being on feet for prolonged periods of time with standing or walking. Stairs are also difficult. Saw Dr. Signe Colt who recommended minimally invasive lumbar decompression. She plans to proceed with procedure once coverage is confirmed by insurance. She is taking tylenol up to TID. Unsure dose - two tablets of either 325 mg or 500 mg. Requests a toradol injection today. Had previously received a toradol injection a couple of years ago for sinus pain and she found good relief of symptoms. She cannot recall if it also helped her back pain at that time.     HTN - Blood pressures have been good. She brings log with her today, they are usually 607-371G systolic recently (earlier readings below were just after discontinuing losartan). Rare (x2) around 150. Her lightheadedness has substantially improved off of losartan. Continues to have some orthostatic symptoms if she  stands too quickly but she is aware of this and knows how manage.             ROS   Per above.     I have reviewed the patient's past medical, surgical, family and medication histories and made appropriate corrections and updates in their respective parts of this chart.     Objective     Vitals:    12/04/19 1132   BP: 130/84   Pulse: 76   Weight: 65.3 kg (144 lb)   Height: 1.63 m (5' 4.17")    Wt Readings from Last 3 Encounters:   12/04/19 65.3 kg (144 lb)   11/30/19 65.3 kg (144 lb)   11/06/19 65.1 kg (143 lb 8 oz)    BP Readings from Last 3 Encounters:   12/04/19 130/84   11/30/19 151/73   11/06/19 118/66        Physical Exam  General appearance: Well appearing, in NAD.  Cardiovascular:  Warm and well perfused.  Pulmonary:  Non-labored breathing.  Skin: Warm and dry.  Neuro:  Alert and oriented. CN II-XII grossly intact.    Labs:  Lab Results   Component Value Date    NA 140 09/21/2019    K 4.3 09/21/2019    CL 102 09/21/2019    CO2 26 09/21/2019    UN 16 09/21/2019    CREAT 1.10 (H) 09/21/2019    VID25 56 09/21/2019    VB12  427 09/21/2019    WBC 4.5 09/21/2019    HGB 14.2 09/21/2019    HCT 44 09/21/2019    PLT 303 09/21/2019    TSH 2.30 09/21/2019    HA1C 5.7 (H) 09/21/2019    CHOL 208 (!) 09/21/2019    TRIG 83 09/21/2019    HDL 72 (H) 09/21/2019    LDLC 119 09/21/2019    [place] 2.9 09/21/2019       Tillie Fantasia, MD  Family Medicine  12/06/19   12:06 PM

## 2019-12-08 ENCOUNTER — Telehealth: Payer: Self-pay

## 2019-12-08 NOTE — Telephone Encounter (Signed)
Called the patient to schedule the MILD L3/4, L4/5 - no auth needed with Dr. Signe Colt.     PLEASE TRANSFER TO [name], OR [name]     Left a message

## 2019-12-16 ENCOUNTER — Telehealth: Payer: Self-pay

## 2019-12-16 NOTE — Telephone Encounter (Signed)
Called pt and pre procedure instructions left.  Pt advised of appointment on December 20 at 37 Pt will need someone to drive them and accompany them home.  Pt driver will need to wait outside. Pt will not need to be NPO for 6 hours prior to procedure.  Medications may be taken with a sip of water. Infection prevention screening done by phone. Reminded pt to call with any symptoms, exposure, or testing Covid 19 related or infection requiring antibiotics. Pt reminded they must wear a mask to enter the building. Pt instructed to hold aspirin x 6 days. Packet sent today. Pt verbalizes understanding.

## 2019-12-21 ENCOUNTER — Other Ambulatory Visit: Payer: Self-pay | Admitting: Pain Medicine

## 2019-12-21 ENCOUNTER — Encounter: Payer: Self-pay | Admitting: Pain Medicine

## 2019-12-21 ENCOUNTER — Ambulatory Visit: Payer: Medicare (Managed Care) | Attending: Pain Medicine | Admitting: Pain Medicine

## 2019-12-21 ENCOUNTER — Ambulatory Visit
Admission: RE | Admit: 2019-12-21 | Discharge: 2019-12-21 | Disposition: A | Discharge status: Home / Self Care | Payer: Medicare (Managed Care) | Source: Ambulatory Visit | Attending: Pain Medicine | Admitting: Pain Medicine

## 2019-12-21 VITALS — BP 148/78 | HR 65 | Temp 96.8°F | Resp 17 | Wt 144.0 lb

## 2019-12-21 DIAGNOSIS — M5451 Vertebrogenic low back pain: Secondary | ICD-10-CM

## 2019-12-21 DIAGNOSIS — R52 Pain, unspecified: Secondary | ICD-10-CM

## 2019-12-21 DIAGNOSIS — M48062 Spinal stenosis, lumbar region with neurogenic claudication: Secondary | ICD-10-CM | POA: Insufficient documentation

## 2019-12-21 DIAGNOSIS — Z006 Encounter for examination for normal comparison and control in clinical research program: Secondary | ICD-10-CM

## 2019-12-21 MED ORDER — CEFAZOLIN SODIUM 1000 MG IJ SOLR *I*
INTRAMUSCULAR | Status: AC
Start: 2019-12-21 — End: 2019-12-21
  Filled 2019-12-21: qty 20

## 2019-12-21 MED ORDER — FENTANYL CITRATE 50 MCG/ML IJ SOLN *WRAPPED*
INTRAMUSCULAR | Status: AC
Start: 2019-12-21 — End: 2019-12-21
  Filled 2019-12-21: qty 2

## 2019-12-21 MED ORDER — ONDANSETRON 4 MG PO TBDP *I*
4.0000 mg | ORAL_TABLET | Freq: Once | ORAL | Status: AC
Start: 2019-12-21 — End: 2019-12-21
  Administered 2019-12-21: 4 mg via ORAL

## 2019-12-21 MED ORDER — CEFAZOLIN SODIUM 1000 MG IJ SOLR *I*
2000.0000 mg | Freq: Once | INTRAMUSCULAR | Status: AC
Start: 2019-12-21 — End: 2019-12-21
  Administered 2019-12-21: 2000 mg via INTRAVENOUS

## 2019-12-21 MED ORDER — FENTANYL CITRATE 50 MCG/ML IJ SOLN *WRAPPED*
100.0000 ug | Freq: Once | INTRAMUSCULAR | Status: AC
Start: 2019-12-21 — End: 2019-12-21
  Administered 2019-12-21: 100 ug via INTRAVENOUS

## 2019-12-21 MED ORDER — SODIUM CHLORIDE 0.9 % IV SOLN WRAPPED *I*
30.0000 mL/h | Status: AC
Start: 2019-12-21 — End: 2019-12-21
  Administered 2019-12-21: 30 mL/h via INTRAVENOUS

## 2019-12-21 MED ORDER — ONDANSETRON 4 MG PO TBDP *I*
ORAL_TABLET | ORAL | Status: AC
Start: 2019-12-21 — End: 2019-12-21
  Filled 2019-12-21: qty 1

## 2019-12-21 NOTE — Patient Instructions (Signed)
of [facility]  Discharge Instructions    MILD (Minimally Invasive Lumbar Decompression:    MILD is a procedure used to help decrease the pressure in the spine area and help people stand and walk better and have less pain.   After this procedure you may notice the following:    1. Ability to walk the day of the procedure  2. Discomfort at the site. These effects are temporary and will diminish over time.    Incision Care:  Remove outer dressing 4 days after procedure. Adhesive strip will cover the incision and should fall off within 7-10 days. You may remove the strip if by day 14 it has not fallen off.  May shower 4 days after procedure. May tub bathe 7 days after procedure.    Activity:   Avoid heavy lifting for 2 days after the procedure.   Walking is strongly recommended and can start as soon as right after the procedure.   Limit car rides to under 1 hour for a few weeks.     Medications:   Resume currently prescribed medications.    Please notify the Pain Center if you have:  1. Redness, swelling, or drainage from the procedure site(s), or a fever over 101 degrees.  2. Excessive bleeding from the procedure site  3. Changes in mentation, unusual behavior, confusion, or difficulty standing/walking.        Please contact a nurse at the Pain Treatment Center at [phone] should you have any questions or concerns regarding your treatment.

## 2019-12-21 NOTE — Progress Notes (Signed)
Pt received discharge instructions from nurse status post procedure and received paperwork. Pt verbalizes understanding. Patient's son Eddie Dibbles driving patient home.     After removing IV patient complained of worsening nausea. Given Oral Zofran per order

## 2019-12-21 NOTE — Progress Notes (Signed)
Patient was examined prior to this procedure.      Change in health status: No      Interval change in structure, distribution quality of pain: No      Interval Change:   Medication Allergies:  Yes   Contrast Dye Allergies: No   Latex Allergies:  No   Current Antibiotics:  No   Current Blood Thinners: No   Recent Fevers/Chills:  No   History of Diabetes:  No   Current Steroids:  No   NPO:    Yes      Plan: proceed with MILD L3/4, L4/5

## 2019-12-27 NOTE — Procedures (Signed)
Date/Time: 12/21/2019 10:20 AM EST  Epidural Steroid Injection(s): Epidurogram - CPT 72275  Procedure: Minimally Invasive Lumbar Decompression - MILD - CPT  0275T and MILD Surgical supply - CPT [phone]  Laterality: Midline    OPERATIVE NOTES -- mild PROCEDURE        12/27/2019  [name], [name]  @    PROCEDURE DIAGNOSIS:  Lumbar Spinal Stenosis with Neurogenic Claudication; W41.324    PROCEDURALIST:  Virl Diamond, MD      PREOPERATIVE DIAGNOSIS:  M01.027 --Spinal stenosis, lumbar region with neurogenic claudication    PROCEDURE PERFORMED:  Unilateral L3/4--percutaneous lumbar decompression  Fluoroscopic guidance  Epidurogram at the target area that is being treated and debulked  IV moderate sedation if needed      ANTIBIOTICS:    Abx intravenously 30 minutes prior to the procedure as a prophylaxis.    SEDATION:  Moderate Sedation:  Due to the Patient's pre-existing painful condition and the requirement for safety, comfort and optimal procedural conditions, moderate sedation was medically necessary. An IV was placed by the nursing staff. Pulse oximeter, automated blood pressure and continuous EKG was applied for cardiopulmonary monitoring. Oxygen was applied using nasal canula. Monitoring performed meets ASA standards. Patient had been NPO for greater than 6 hours and no contraindications were noted by history.      ---        OPERATIVE PROCEDURE AND FINDINGS  INDICATIONS FOR PROCEDURE:  The patient's ID was confirmed, and a time-out was performed. Time, site, location, and procedure agreed upon and consented for were reviewed. The patient was radiologically confirmed to be suffering from clinically symptomatic lumbar spinal stenosis and confirmed neurogenic claudication. The patient's condition has progressed such that activities of daily living are limited due to pain intensity and neurogenic claudication symptomology. Standing time and walking distance have declined steadily. Patient's clinical notes  for office visits show tried and failed conservative treatments, such as physical therapy, pharmaceutical management, or other treatments for chronic low back pain and neurogenic claudication. According to the treatment algorithm for lumbar spinal stenosis, the patient is a candidate for a lumbar decompression procedure.    The patient recalls our previous office discussion regarding the indications, risks, and potential benefits of the mild procedure-- a percutaneous lumbar decompression. Informed consent was obtained, and the patient indicated understanding and agreed with the treatment proposed.    PROCEDURE:  The patient was placed in the prone position in the fluoroscopy suite with a pillow (bolster) under the hips to assist with reversing lordosis of the spine. Following IV sedation and antibiotic administration, the patient was prepped and draped in my usual standard fashion.    Ample amounts of local anesthetic were used to anesthetize the skin and deep facial planes after topographical landmarks were identified. Then, an epidurogram was performed under fluoroscopic guidance at the level of concern, by the insertion of a Tuohy needle and use of minimal amounts of myelographically compatible contrast, which showed decreased contrast flow past the levels of concern. An oblique contralateral view of the epidurogram was undertaken, which indicated lumbar spinal stenosis as shown by the decreased contrast flow. A small incision was made with a sharp scalpel blade, and a trocar with portal was inserted percutaneously under fluoroscopic guidance to contact the lamina indicated. A bone-sculpting rongeur was inserted to remove adequate amounts of lamina (laminotomy) from the superior surface of the inferior operative lamina site and from the inferior aspect of the lamina above it. The laminotomy created a passage for  the tissue sculpting instrument used in debulking the ligamentum flavum. The ligamentum flavum was  debulked in such a way that the epidural contrast, upon repeat of the epidurogram, showed improved flow. There were no complications or difficulties noted with these procedures.    The epidurogram was refreshed repeatedly throughout the procedure and demonstrated improvement in the original narrowed canal. Upon completion of the procedure, instruments were removed, hemostasis was achieved by direct pressure, and closure of the wounds were performed. Appropriate dressings were used. The patient tolerated the procedure well. Upon transferring the patient to the recovery room, the patient's vital signs remained stable and without any neurologic deficits. A brief neurological exam was performed and showed adequate strength and no loss of sensation in the lower extremities. The patient was discharged with instructions to rest, continue with ice, and to demonstrate progressive mobility. The patient was given a follow-up exam time and date along with instructions and contact information for any questions or concerns.  ---          The patient is to be followed up in the office for further evaluation and treatment as deemed appropriate and necessary and for any concerns regarding the procedure.    CONCLUSION:    A successful fluoroscopically guided percutaneous unilateral L3/4 lumbar decompression was undertaken at the indicated levels above with IV sedation and adequate local anesthesia. The patient was neurologically intact without complications, freely able to move and respond to commands with IV sedation, and able to move lower extremities. Movement of toes, feet, knees, and hips were reviewed prior to being discharged from the procedure room and the recovery room, as indicated. There was no new loss of sensation over the lower extremities.

## 2020-01-11 ENCOUNTER — Ambulatory Visit: Payer: Medicare (Managed Care) | Admitting: Pain Medicine

## 2020-01-11 ENCOUNTER — Encounter: Payer: Self-pay | Admitting: Pain Medicine

## 2020-01-11 VITALS — BP 140/80 | HR 52 | Temp 97.0°F | Ht 64.0 in

## 2020-01-11 DIAGNOSIS — M48062 Spinal stenosis, lumbar region with neurogenic claudication: Secondary | ICD-10-CM

## 2020-01-11 DIAGNOSIS — Z006 Encounter for examination for normal comparison and control in clinical research program: Secondary | ICD-10-CM

## 2020-01-11 DIAGNOSIS — M7918 Myalgia, other site: Secondary | ICD-10-CM

## 2020-01-11 MED ORDER — TIZANIDINE HCL 2 MG PO TABS *I*
2.0000 mg | ORAL_TABLET | Freq: Two times a day (BID) | ORAL | 1 refills | Status: DC | PRN
Start: 2020-01-11 — End: 2020-02-17

## 2020-01-11 NOTE — Progress Notes (Signed)
[place]    This is a confidential report written only for the purpose of professional communication.  Clients who wish to receive these findings are requested to contact the [facility] [place].     Subjective:    HPI:   [name] is a 80 y.o. female with PMH of HTN, AAA s/p EVAR and right CFA endarterectomy 11/14/16, and lumbar spinal stenosis with neurogenic claudication who presents for follow up of low back pain.   [name] was last seen at the [facility] on 12/21/19, at which time she had a MILD procedure. She reports improvement in her ability to walk farther distances. She is able to walk 1500-1800 steps a day with essentially no pain. She has improvement in her ability to perform household activities. She does report some upper lumbar pain that is different than her pain previously. She reports doing a lot of bending during christmas time. She denies any post-procedural complications.     The pain is located in the upper lumbar region. The pain does not radiate. The pain is present sometimes. The patient describes the pain as pressure. Aggravating factors include bending, getting up from the chair. Alleviating factors include rest.    Patient denies weakness or numbness/tingling. Patient denies recent bowel or bladder dysfunction, saddle anesthesia, or loss of balance/falls.      Pain Range in Past Week: 2/10 to 10/10.  Pain Rating Today: 4/10.      Current Pain Medications and Effectiveness:  None    iSTOP: [id number]         Adverse Medication Effects:   Constipation: no   Sedation: no   Nausea/Vomiting: no   Itching: no      Physical Therapy/TENS: dates reportedly: 10/01/19 to 10/29/19  Sleep: affected  Activities: affected  Mood/Behavioral Medicine: stable  Appetite: stable  Employment: Retired          Injections and Effectiveness:  - 12/21/19- MILD  Previously had bilateral L5 TFESI and bilateral S1 TFESI with Dr. Luanna Salk which  provided 5 days of relief.         Social Hx:  Social History     Tobacco Use    Smoking status: Former Smoker     Packs/day: 1.00     Years: 30.00     Pack years: 30.00     Types: Cigarettes     Quit date: 06/15/2013     Years since quitting: 6.5    Smokeless tobacco: Former Systems developer   Substance Use Topics    Alcohol use: Yes     Alcohol/week: 14.0 standard drinks     Types: 14 Glasses of wine per week     Comment: 2/day         Patient's allergies, surgical, social, and family histories were reviewed and updated as appropriate without changes.    Review of Systems      Constitutional - Denies fever, chills, weight loss  Skin - Denies Rash    HENT - Denies cough  Eyes - Denies vision changes  Cardiovascular - Denies chest pain  Respiratory - Denies shortness of breath  Gastrointestinal - Denies nausea or vomiting  Genitourinary - Denies incontinence   Musculoskeletal - Positive for lumbar back pain  Endo/Heme/Aller - Denies bleeding  Neurological - Denies headache  Psychological - Denies depression    Objective:  Vital signs:  Vitals:    01/11/20 1038   BP: 140/80  Pulse: 52   Temp: 36.1 C (97 F)   Height: 1.626 m ('5\' 4"' )           General:  Awake, Alert, in No apparent distress sitting in examination room    Musculoskeletal Examination:   - Inspection: No gross deformity at the lumbar spine. MILD incisions well healed  - ROM: AROM in lumbar spine is limited secondary to pain.  - PROM in lower extremities is full  - Palpation: Tender over the lumbar paraspinals  - Normal muscle tone, no spasticity.    Gait:  - normal    Special Tests:  - Negative seated straight leg raise bilaterally.    - Negative Kemp's facet loading test in lumbar spine bilaterally    Neurological Examination:  Mental status: alert, awake, and oriented x3.  Sensory:  - Light touch sensation of lower limbs is intact bilaterally    DTR's:  - R/L patella 2+/2+, Achilles 2+/2+.  - No clonus    Motor strength:   Motor strength  Right  Left    L2   Hip flexors  5 5   L3  Knee extensors  5 5   L4  Ankle dorsiflexors  5 5   L5  Extensor hallucis longus  5 5   S1  Ankle plantar flexors  5 5         Imaging:   05/06/19: Lumbar spine CT w/o contrast     For purposes of numeration, 5 lumbar vertebral segments are designated. Mild levocurvature of the thoracolumbar spine is observed, with leftward apex at L1-L2. Straightening of the lumbar lordosis is present. The heights of the vertebral bodies are preserved. No acute lumbosacral fracture is identified. 2 mm retrolisthesis of L1 on L2 is noted. Severe intervertebral disc space narrowing is observed at L3-L4 through L5-S1.     Review of the individual levels reveals the following:     T12-L1: No disc herniation, spinal stenosis or foraminal narrowing.     L1-L2: A disc bulge and vertebral osteophytes deform the ventral thecal sac. Bilateral facet hypertrophy and ligamentum flava thickening. No spinal stenosis. Mild right foraminal narrowing.     L2-L3: A disc bulge deforms the ventral thecal sac. Bilateral facet hypertrophy and ligamentum flava thickening. Mild spinal stenosis. No foraminal narrowing.     L3-L4: A disc bulge and vertebral osteophytes efface the ventral thecal sac. Bilateral facet hypertrophy and ligamentum flava thickening. Moderate spinal stenosis. Mild left foraminal narrowing.     L4-L5: A disc bulge and vertebral osteophytes efface the ventral thecal sac. Severe spinal stenosis and bilateral lateral recess stenoses. Mild-moderate bilateral foraminal narrowing.     L5-S1: A disc bulge and vertebral osteophytes efface the ventral epidural fat. Bilateral facet hypertrophy and ligamenta flava thickening. Mild spinal stenosis. Severe right and moderate left foraminal narrowing.     An aortoiliac endograft stent is in place for the aortic aneurysm. 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     Assessment/Plan: [name] is a 80 y.o. female with PMH of  HTN, AAA s/p EVAR and right CFA endarterectomy 11/14/16, and lumbar spinal stenosis with neurogenic claudication who presents for follow up of low back pain.   [name] was last seen at the [facility] on 12/21/19, at which time she had a MILD procedure. She reports improvement in her ability to walk farther distances. She is able to walk 1500-1800 steps a day with essentially no  pain. She has improvement in her ability to perform household activities. She does report some upper lumbar pain that is different than her pain previously. The symptoms and physical examination are consistent with axial back spasms that appears to be myofascial in nature. She continues to obtain relief of her previous symptoms of leg pain and we anticipate that she will continue to improve over the next several weeks. Recommend trial of tizanidine 2 mg qhs for spasms.     Our Recommendations are as Follows:    Medications:  - Recommend trial of tizanidine 2 mg BID PRN: patient advised to initiate nightly dose first and increase to BID if tolerated. Prescription sent today via mail order.     Activity:  - We encourage a multimodal pain treatment program including physical exercise, healthy diet, good sleep hygiene, and social support.      Follow up:  1 month    Thank you for allowing [place] the opportunity to care for this patient, it has been our pleasure to do so. The patient was offered opportunities to ask questions and engage in discussion regarding the treatment and plan of care.    Patient was discussed, examined, and treatment plan agreed upon with the attending, Dr. Signe Colt.     Signed: Veatrice Bourbon, DO, Pain Medicine Fellow 01/11/2020   [facility]  Department of Anesthesiology

## 2020-01-15 ENCOUNTER — Ambulatory Visit: Payer: Medicare (Managed Care) | Admitting: Surgery

## 2020-01-15 ENCOUNTER — Other Ambulatory Visit: Payer: Medicare (Managed Care)

## 2020-01-19 ENCOUNTER — Encounter: Payer: Self-pay | Admitting: Family Medicine

## 2020-01-20 ENCOUNTER — Other Ambulatory Visit: Payer: Self-pay | Admitting: Vascular Surgery

## 2020-01-20 DIAGNOSIS — Z8679 Personal history of other diseases of the circulatory system: Secondary | ICD-10-CM

## 2020-01-20 DIAGNOSIS — Z9889 Other specified postprocedural states: Secondary | ICD-10-CM

## 2020-01-25 ENCOUNTER — Encounter: Payer: Self-pay | Admitting: Surgery

## 2020-01-25 ENCOUNTER — Ambulatory Visit: Payer: Medicare (Managed Care) | Attending: Vascular Surgery | Admitting: Vascular Surgery

## 2020-01-25 ENCOUNTER — Ambulatory Visit
Admission: RE | Admit: 2020-01-25 | Discharge: 2020-01-25 | Disposition: A | Discharge status: Home / Self Care | Payer: Medicare (Managed Care) | Source: Ambulatory Visit

## 2020-01-25 VITALS — BP 136/76 | Ht 64.0 in | Wt 144.0 lb

## 2020-01-25 DIAGNOSIS — I714 Abdominal aortic aneurysm, without rupture, unspecified: Secondary | ICD-10-CM

## 2020-01-25 DIAGNOSIS — Z8679 Personal history of other diseases of the circulatory system: Secondary | ICD-10-CM | POA: Insufficient documentation

## 2020-01-25 DIAGNOSIS — Z9889 Other specified postprocedural states: Secondary | ICD-10-CM

## 2020-01-25 DIAGNOSIS — Z95828 Presence of other vascular implants and grafts: Secondary | ICD-10-CM | POA: Insufficient documentation

## 2020-01-25 LAB — CV DOPPLER AORTA COMPLETE
Aorta Diameter A-P Distal: 3.12 cm
Aorta Diameter A-P Mid: 2.75 cm
Aorta Diameter A-P Prox: 2.96 cm
Aorta Diameter Trans Distal: 3.1 cm
Aorta Diameter Trans Mid: 2.31 cm
Aorta Diameter Trans Prox: 2.9 cm
Aorta EDV Distal: 0 cm/s
Aorta EDV Mid: 0 cm/s
Aorta EDV Prox: 0 cm/s
Aorta PSV Distal: 27.37 cm/s
Aorta PSV Mid: 26.27 cm/s
Aorta PSV Prox: 39.46 cm/s
Left Common Iliac Artery Prox AP Diameter: 1.42 cm
Left Common Iliac Artery Prox EDV: 0 cm/s
Left Common Iliac Artery Prox PSV: 138.09 cm/s
Left Common Iliac Artery Prox Trans Diameter: 1.23 cm
Right Common Iliac Artery Prox AP Diameter: 1.31 cm
Right Common Iliac Artery Prox EDV: 2.01 cm/s
Right Common Iliac Artery Prox PSV: 69.23 cm/s
Right Common Iliac Artery Prox Trans Diameter: 1.18 cm

## 2020-01-25 NOTE — Progress Notes (Signed)
Vascular Surgery Outpatient Clinic Follow-Up Visit    HPI:     [name] is a 80 y.o. female with PMH significant for HLD, chronic back pain, Sciatica, herniated discs, GERD and smoking history: quit in 2015, presents to the Vascular Surgery outpatient clinic for routine follow up of past AAA repair.  [name] is s/p EVAR with R femoral endarterectomy and bovine pericardial patch angioplasty 11/14/16  With Dr. Melony Overly.     [name] notes that she has been doing well from a vascular perspective since she was last seen.  She denies any episodes of severe abdominal pain of unclear etiology.  She does have noted backpain at baseline and continues to work with other providers to help alleviate this discomfort.  She denies claudication, rest pain, tissue loss.  On ASA/statin.    Vascular risk factors:   Age: 80 y.o.  Female gender: no  Diabetes mellitus: no  Obesity: no, Body mass index is 24.72 kg/m.  Hypertension: yes  Hyperlipidemia: yes  Tobacco abuse: no, quit  Family history: no  VQI Info:  Occupation: Retired  Ambulatory status: Ambulates  Current living status: Home Comorbidities:  Coronary artery disease: no  Renal disease: no  Lung disease: no,   Arrhythmia: no  Prior CVA: no  CHF: no  Prior PCI: no  Prior open heart surgery: no  Liver disease: no  Coagulopathy: no     REVIEW OF SYSTEMS  ROS - pertinent items listed in HPI    MEDICAL HISTORY  Past Medical History:   Diagnosis Date    AAA (abdominal aortic aneurysm)     Chronic pain     Elevated LFTs     Unclear if this was related to statin, gallstones, alcohol consumption     Essential hypertension 06/11/2016    GERD (gastroesophageal reflux disease)     High blood pressure     HLD (hyperlipidemia)     Hx of colonic polyps     Colonscopy 08/11/10; repeat 2017 per GI     Lumbar disc herniation 2006    L5-S1    Osteoporosis     Spondylosis     Urinary retention        SURGICAL HISTORY  Past Surgical History:   Procedure Laterality Date    cataract repair  Bilateral     COLONOSCOPY  2011    HYSTERECTOMY      left ovaries in place     TUBAL LIGATION         FAMILY HISTORY  Family History   Problem Relation Age of Onset    Dementia Mother 80    Cancer Mother         lung    High cholesterol Mother     Stroke Mother     Parkinsonism Father     Heart Disease Father 7        passed    High cholesterol Father     Heart attack Father     Cancer Sister         Breast; in recovery    High cholesterol Sister     Hip fracture Sister     Breast cancer Sister 69    Cancer Brother         prostate    High cholesterol Brother     Diabetes Brother     Aneurysm Neg Hx     Anesthesia problems Neg Hx     Colon cancer Neg Hx  Colon polyps Neg Hx     Ovarian cancer Neg Hx         SOCIAL HISTORY   reports that she quit smoking about 6 years ago. Her smoking use included cigarettes. She has a 30.00 pack-year smoking history. She has quit using smokeless tobacco. She reports current alcohol use of about 14.0 standard drinks of alcohol per week. She reports that she does not use drugs.     ALLERGIES  Oxycodone, Environmental allergies, and Lipitor [atorvastatin]     MEDICATIONS  Current Outpatient Medications   Medication Sig    tiZANidine (ZANAFLEX) 2 mg tablet Take 1 tablet (2 mg total) by mouth 2 times daily as needed for Muscle spasms  Start with night time dose    fluticasone (VERAMYST) 27.5 MCG/SPRAY nasal spray Spray 2 sprays into each nostril daily    hydroCHLOROthiazide (HYDRODIURIL) 25 mg tablet TAKE 1 TABLET BY MOUTH EVERY MORNING    simvastatin (ZOCOR) 20 mg tablet Take 1 tablet (20 mg total) by mouth daily (with dinner)    amLODIPine (NORVASC) 5 MG tablet TAKE 1 TABLET BY MOUTH EVERY DAY    acetaminophen (TYLENOL) 325 MG tablet Take by mouth every 6 hours as needed for Pain    Calcium Carbonate-Vitamin D (CALCIUM 500 + D PO) Take 1 tablet by mouth 2 times daily    aspirin 81 MG tablet Take 1 tablet (81 mg total) by mouth daily    Misc. Devices  [place] (Patient not taking: Reported on 01/25/2020)     No current facility-administered medications for this visit.        Objective:      Vitals:    01/25/20 0915   BP: 136/76   Weight: 65.3 kg (144 lb)   Height: 1.626 m (5\' 4" )     Imaging:  Interpretation Summary  History: EVAR  Prior: 12/20    Aortic endovascular graft appears patent without evidence of an endoleak and a maximum residual sac of 3.1 cm, previously 4.5 cm.    Physical Exam:      General appearance: healthy, alert, active, no distress and cooperative  HEENT: PERRLA  CV: regular rate and rhythm, S1, S2 normal, no murmur, click, rub or gallop  Lungs:clear  Abd: soft, non-tender. Bowel sounds normal. No masses,  no organomegaly   Extremities: palpable femoral, intact DP/PT signals  Neuro: normal without focal findings, mental status, speech normal, alert and oriented x3, PERLA and reflexes normal and symmetric    Assessment:   [name] is a 80 y.o. female s/p EVAR, doing well.  No concerns on imaging.    Plan:     AAA s/p repair   I personally reviewed images of the aorta complete which showed patent endograft without evidence of leak.  Decrease in residual sac, measuring 3.1 cm on today's exam   Results discussed with patient - as above    No indication for surgical intervention at this time    Recommend ongoing surveillance and medical management, good BP control    Seek immediate medical attention for any severe episodes of abdominal or back pain (that differs from baseline)   RTC in 1 year for repeat aortic duplex and office visit. Will also obtain CTA abdomen/pelvis prior to appointment for routine 5 year follow up    Contact clinic in the interim with any questions or concerns.    Carver Fila, NP  01/25/2020 at 12:57 PM

## 2020-01-28 ENCOUNTER — Encounter: Payer: Self-pay | Admitting: Pain Medicine

## 2020-02-08 ENCOUNTER — Encounter: Payer: Self-pay | Admitting: Pain Medicine

## 2020-02-08 ENCOUNTER — Ambulatory Visit: Payer: Medicare (Managed Care) | Admitting: Pain Medicine

## 2020-02-08 VITALS — BP 161/76 | HR 70 | Temp 98.4°F

## 2020-02-08 DIAGNOSIS — M47816 Spondylosis without myelopathy or radiculopathy, lumbar region: Secondary | ICD-10-CM

## 2020-02-08 NOTE — Progress Notes (Signed)
[place]    This is a confidential report written only for the purpose of professional communication.  Clients who wish to receive these findings are requested to contact the [facility].     Subjective:      HPI:   KENNEDI LIZARDO is a 80 y.o. female with PMH of HTN, AAA s/p EVAR and right CFA endarterectomy 11/14/16, and lumbar spinal stenosis with neurogenic claudication who presents for follow up of low back pain.   On 12/21/19 she had a MILD procedure. Patient reports that she had only short term improvement.     The pain is located in the upper lumbar region. The pain does not radiate. The pain is present sometimes. The patient describes the pain as pressure. Aggravating factors include bending, getting up from the chair. Alleviating factors include rest.    Patient denies weakness or numbness/tingling. Patient denies recent bowel or bladder dysfunction, saddle anesthesia, or loss of balance/falls.    Pain Rating Today: 6/10      Current Pain Medications and Effectiveness:  None    iSTOP: [id number]    Physical Therapy/TENS: dates reportedly: 10/01/19 to 10/29/19  Sleep: affected  Activities: affected  Mood/Behavioral Medicine: stable  Appetite: stable  Employment: Retired      Injections and Effectiveness:  - 12/21/19- MILD  Previously had bilateral L5 TFESI and bilateral S1 TFESI with Dr. Luanna Salk which provided 5 days of relief.       Social Hx:  Social History     Tobacco Use    Smoking status: Former Smoker     Packs/day: 1.00     Years: 30.00     Pack years: 30.00     Types: Cigarettes     Quit date: 06/15/2013     Years since quitting: 6.6    Smokeless tobacco: Former Systems developer   Substance Use Topics    Alcohol use: Yes     Alcohol/week: 14.0 standard drinks     Types: 14 Glasses of wine per week     Comment: 2/day         Patient's allergies, surgical, social, and family histories were reviewed and updated as appropriate without  changes.    Review of Systems      Constitutional - Denies fever, chills, weight loss  Skin - Denies Rash    HENT - Denies cough  Eyes - Denies vision changes  Cardiovascular - Denies chest pain  Respiratory - Denies shortness of breath  Gastrointestinal - Denies nausea or vomiting  Genitourinary - Denies incontinence   Musculoskeletal - Positive for lumbar back pain  Endo/Heme/Aller - Denies bleeding  Neurological - Denies headache  Psychological - Denies depression    Objective:  Vital signs:  Vitals:    02/08/20 1026   BP: 161/76   Pulse: 70   Temp: 36.9 C (98.4 F)           General:  Awake, Alert, in No apparent distress sitting in examination room    Musculoskeletal Examination:   - Inspection: No gross deformity at the lumbar spine. MILD incisions well healed  - ROM: AROM in lumbar spine is limited secondary to pain to 50% of normal.  - PROM in lower extremities is full  - Palpation: Tender over the lumbar paraspinals  - Normal muscle tone, no spasticity.    Gait:  - normal    Special Tests:  - Negative seated straight leg raise  bilaterally.  - Positive facet loading test bilaterally    Neurological Examination:  Mental status: alert, awake, and oriented x3.  Sensory:  - Light touch sensation of lower limbs is intact bilaterally    DTR's:  - R/L patella 2+/2+, Achilles 2+/2+.  - No clonus    Motor strength:   Motor strength  Right  Left    L2  Hip flexors  5 5   L3  Knee extensors  5 5   L4  Ankle dorsiflexors  5 5   L5  Extensor hallucis longus  5 5   S1  Ankle plantar flexors  5 5         Imaging:   05/06/19: Lumbar spine CT w/o contrast     For purposes of numeration, 5 lumbar vertebral segments are designated. Mild levocurvature of the thoracolumbar spine is observed, with leftward apex at L1-L2. Straightening of the lumbar lordosis is present. The heights of the vertebral bodies are preserved. No acute lumbosacral fracture is identified. 2 mm retrolisthesis of L1 on L2 is noted. Severe intervertebral  disc space narrowing is observed at L3-L4 through L5-S1.     Review of the individual levels reveals the following:     T12-L1: No disc herniation, spinal stenosis or foraminal narrowing.     L1-L2: A disc bulge and vertebral osteophytes deform the ventral thecal sac. Bilateral facet hypertrophy and ligamentum flava thickening. No spinal stenosis. Mild right foraminal narrowing.     L2-L3: A disc bulge deforms the ventral thecal sac. Bilateral facet hypertrophy and ligamentum flava thickening. Mild spinal stenosis. No foraminal narrowing.     L3-L4: A disc bulge and vertebral osteophytes efface the ventral thecal sac. Bilateral facet hypertrophy and ligamentum flava thickening. Moderate spinal stenosis. Mild left foraminal narrowing.     L4-L5: A disc bulge and vertebral osteophytes efface the ventral thecal sac. Severe spinal stenosis and bilateral lateral recess stenoses. Mild-moderate bilateral foraminal narrowing.     L5-S1: A disc bulge and vertebral osteophytes efface the ventral epidural fat. Bilateral facet hypertrophy and ligamenta flava thickening. Mild spinal stenosis. Severe right and moderate left foraminal narrowing.     An aortoiliac endograft stent is in place for the aortic aneurysm. 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       Assessment/Plan:   BILL MCVEY is a 80 y.o. female with PMH of HTN, AAA s/p EVAR and right CFA endarterectomy 11/14/16, and lumbar spinal stenosis with neurogenic claudication who presents for follow up of low back pain.   On 12/21/19 she had a MILD procedure. Patient reports that she had only short term improvement.  Her symptoms and physical examination are consistent with axial lumbar pain in the setting of multilevel facet joint arthropathy. We have discussed our recommendations with the patient. We recommend:      Interventions:  We discussed with the patient a trial of diagnostic medial branch blocks. She elected to  defer the procedure.     Follow up:  Patient will contact us if she decided to go through with diagnostic blocks.  Risks and benefits were explained. Patient has verbalized understanding and agreed to proceed with a proposed treatment plan.    Patient was encouraged to contact us for any worsening of symptoms, persisting symptoms, or any other concerns.  Patient was provided the opportunity to ask questions  Thank you for allowing [place] the opportunity to care for this patient.  Patient was discussed, examined, and treatment plan developed with the attending, Dr. Virl Diamond, MD.    Dictated by: Kerman Passey, PA  02/08/2020

## 2020-02-17 ENCOUNTER — Ambulatory Visit: Payer: Medicare (Managed Care) | Admitting: Primary Care

## 2020-02-17 ENCOUNTER — Encounter: Payer: Self-pay | Admitting: Primary Care

## 2020-02-17 VITALS — BP 132/74 | HR 60 | Temp 97.9°F | Ht 64.17 in | Wt 143.0 lb

## 2020-02-17 DIAGNOSIS — L739 Follicular disorder, unspecified: Secondary | ICD-10-CM

## 2020-02-17 MED ORDER — MUPIROCIN 2 % EX OINT *I*
TOPICAL_OINTMENT | Freq: Three times a day (TID) | CUTANEOUS | 0 refills | Status: DC
Start: 2020-02-17 — End: 2020-09-08

## 2020-02-17 NOTE — Progress Notes (Signed)
[facility] - Office Visit Note     Chief Complaint   Patient presents with    Rash    Vaginal Pain       1) Perianal skin issue  - for several days patient has had a painful area in the gluteal crease   - it followed some loose stool  - she also notes that she bought incontinence pads that were too large and feels these may have rubbed in that area   - not sexually active, no new sexual partners   - has used topical antibiotic and has seemed to help some   - denies dysuria       BP 132/74    Pulse 60    Temp 36.6 C (97.9 F) (Temporal)    Ht 1.63 m (5' 4.17")    Wt 64.9 kg (143 lb)    LMP  (LMP Unknown)    SpO2 100%    BMI 24.41 kg/m    Wt Readings from Last 3 Encounters:   02/17/20 64.9 kg (143 lb)   01/25/20 65.3 kg (144 lb)   12/21/19 65.3 kg (144 lb)     GEN: well appearing mature adult female, NAD  G/U: the left gluteal crease near the anus on the inner buttock there is an approximately 1 cm x 1 cm erythematous lesion without surrounding erythremia or drainage, there is loose non vascular skin on the top consistent with a skin tear or shearing injury given the hx     Assessment/Plan:  1) Skin tear of the gluteal crease/peri-anal area  - given her age and the fragility of the tissue I think the skin tore from the adhesive of the oversized incontinence pad   - there does not appear to be evidence of secondary infection currently  - we discussed keeping the area clean (use wipes after bathroom), regular bathing, change pads regularly, and fine for using topical antibiotic cream  - if concern about worsening we should re-evaluate     Follow up already scheduled just over 2 weeks         Mickel Baas E. Edwin Cherian DO     I personally spent 20 minutes on this encounter on the calendar date of service, including pre and post visit work.

## 2020-02-22 ENCOUNTER — Ambulatory Visit: Payer: Medicare (Managed Care) | Admitting: Family Medicine

## 2020-02-23 ENCOUNTER — Other Ambulatory Visit: Payer: Self-pay | Admitting: Family Medicine

## 2020-02-23 DIAGNOSIS — I10 Essential (primary) hypertension: Secondary | ICD-10-CM

## 2020-03-07 ENCOUNTER — Ambulatory Visit: Payer: Medicare (Managed Care) | Admitting: Family Medicine

## 2020-03-07 ENCOUNTER — Encounter: Payer: Self-pay | Admitting: Family Medicine

## 2020-03-07 VITALS — BP 124/80 | HR 70 | Ht 64.17 in | Wt 142.0 lb

## 2020-03-07 DIAGNOSIS — R1013 Epigastric pain: Secondary | ICD-10-CM

## 2020-03-07 DIAGNOSIS — K219 Gastro-esophageal reflux disease without esophagitis: Secondary | ICD-10-CM

## 2020-03-07 DIAGNOSIS — R0981 Nasal congestion: Secondary | ICD-10-CM

## 2020-03-07 DIAGNOSIS — I10 Essential (primary) hypertension: Secondary | ICD-10-CM

## 2020-03-07 DIAGNOSIS — M48062 Spinal stenosis, lumbar region with neurogenic claudication: Secondary | ICD-10-CM

## 2020-03-07 MED ORDER — CELECOXIB 100 MG PO CAPS *I*
100.0000 mg | ORAL_CAPSULE | Freq: Every day | ORAL | 1 refills | Status: DC
Start: 2020-03-07 — End: 2020-04-18

## 2020-03-07 MED ORDER — FLUTICASONE FUROATE 27.5 MCG/SPRAY NA SUSP *A*
2.0000 | Freq: Every day | NASAL | 5 refills | Status: DC
Start: 2020-03-07 — End: 2021-05-03

## 2020-03-07 NOTE — Patient Instructions (Signed)
Get blood work done, this does not need to be done fasting     For your reflux, after you get the blood work done:  Try famotidine daily (also called pepcid)   A normal dose is 20-40 mg daily     Try celebrex 100 mg daily for your back pain   If your GI symptoms worsen then discontinue     Call vascular surgery to clarify with them if you need to stay on baby aspirin

## 2020-03-07 NOTE — Progress Notes (Signed)
[facility]  Outpatient Progress Note     Date:  03/07/2020  Name:  [name]   MRN:  [id number]  DOB:  07-26-1940   Assessment and Plan     [name] is a 80 y.o. female presenting for follow up.    1. Primary hypertension  BP well controlled, continue current management   - Basic metabolic panel; Future    2. Spinal stenosis of lumbar region with neurogenic claudication  Patient with multiple medication intolerances in the past  Pain still an issue, not at goal  She is open to trying celebrex again  Advised to take with food and not combine with other NSAIDS. Stop if worsening GI symptoms.   - celecoxib (CELEBREX) 100 mg capsule; Take 1 capsule (100 mg total) by mouth daily  Dispense: 30 capsule; Refill: 1    3. Nasal congestion  Refill  - fluticasone (VERAMYST) 27.5 MCG/SPRAY nasal spray; Spray 2 sprays into each nostril daily  Dispense: 9.1 mL; Refill: 5    4. Gastroesophageal reflux disease, unspecified whether esophagitis present  5. Dyspepsia  Discussed options for H pylori testing, patient requests blood test rather than breath test or stool test. Reviewed decreased sensitivity, specificity for this lab and she voices understanding.   Trial famotidine for symptoms.   Follow up 6 weeks.   - H. PYLORI ANTIBODY, IGG; Future        F/U: Follow up in about 6 weeks (around 04/18/2020) for Follow-up GERD, back pain.     SUBJECTIVE     Chief Complaint   Patient presents with    Hypertension    Back Pain       HTN -  BP well controlled  Home BPs have been usually 458K systolic   Denies concerns   Fewer lightheaded spells off losartan   Antihypertensive Medications             amLODIPine (NORVASC) 5 mg tablet (Taking) TAKE 1 TABLET BY MOUTH EVERY DAY    hydroCHLOROthiazide (HYDRODIURIL) 25 mg tablet (Taking) TAKE 1 TABLET BY MOUTH EVERY MORNING          Low back pain -   Symptoms have not been well controlled   Dr. Signe Colt - pain medicine    Had MILD procedure on 12/21/19   Short lived relief     She does not want to pursue surgery    Given muscle relaxant, didn't help at all  Medications: not doing anything beyond a heating pad. Tries to walk but limited due to pain. Previously used tylenol prn. Has had reactions to many medications in the past. Doesn't want to try celebrex because of GERD. Tried a long time ago and it flared GI symptoms.   Has done PT in the past. Does exercises.     GERD -   Issue for years   Seems to come and go   Hasn't had last 4 days  Usually takes tums and gas x before bed. Helps with intermittent bloating sx.   Denies nausea, vomiting, changes in bowel movements, blood in stool, black stools  Up to date on colonoscopy. Denies having EGD done in the past. Does not see GI regularly.     Nose pain -   Since thanksgiving   Was using bactroban ointment   [place] for moisturizing     Congestion - requesting refill for nasal steroid spray     ROS   Per above.  I have reviewed the patient's past medical, surgical, family and medication histories and made appropriate corrections and updates in their respective parts of this chart.     Objective     Vitals:    03/07/20 1033   BP: 124/80   Pulse: 70   Weight: 64.4 kg (142 lb)   Height: 1.63 m (5' 4.17")    Wt Readings from Last 3 Encounters:   03/07/20 64.4 kg (142 lb)   02/17/20 64.9 kg (143 lb)   01/25/20 65.3 kg (144 lb)    BP Readings from Last 3 Encounters:   03/07/20 124/80   02/17/20 132/74   02/08/20 161/76        Physical Exam  General appearance: Well appearing, in NAD.  Cardiovascular:  Warm and well perfused.  Pulmonary:  Non-labored breathing.  Skin: Warm and dry.  Neuro:  Alert and oriented. CN II-XII grossly intact.    Labs:  Lab Results   Component Value Date    NA 140 09/21/2019    K 4.3 09/21/2019    CL 102 09/21/2019    CO2 26 09/21/2019    UN 16 09/21/2019    CREAT 1.10 (H) 09/21/2019    VID25 56 09/21/2019    VB12 427 09/21/2019    WBC 4.5 09/21/2019    HGB 14.2 09/21/2019    HCT 44 09/21/2019    PLT 303 09/21/2019     TSH 2.30 09/21/2019    HA1C 5.7 (H) 09/21/2019    CHOL 208 (!) 09/21/2019    TRIG 83 09/21/2019    HDL 72 (H) 09/21/2019    LDLC 119 09/21/2019    [place] 2.9 09/21/2019       Diane Fantasia, MD  Family Medicine  03/07/20   1:06 PM

## 2020-03-10 ENCOUNTER — Telehealth: Payer: Self-pay

## 2020-03-10 NOTE — Telephone Encounter (Signed)
Sent patient mychart message

## 2020-03-10 NOTE — Telephone Encounter (Signed)
Pt says her PCP wants to know if pt needs to continue taking her aspirin 81 mg tablets.If she  Does not answer the phone please leave her a voicemail or send her a my chart message.

## 2020-03-14 ENCOUNTER — Other Ambulatory Visit
Admission: RE | Admit: 2020-03-14 | Discharge: 2020-03-14 | Disposition: A | Discharge status: Home / Self Care | Payer: Medicare (Managed Care) | Source: Ambulatory Visit | Attending: Family Medicine | Admitting: Family Medicine

## 2020-03-14 DIAGNOSIS — R1013 Epigastric pain: Secondary | ICD-10-CM

## 2020-03-14 DIAGNOSIS — I10 Essential (primary) hypertension: Secondary | ICD-10-CM | POA: Insufficient documentation

## 2020-03-14 DIAGNOSIS — K219 Gastro-esophageal reflux disease without esophagitis: Secondary | ICD-10-CM | POA: Insufficient documentation

## 2020-03-14 LAB — BASIC METABOLIC PANEL
Anion Gap: 15 (ref 7–16)
CO2: 27 mmol/L (ref 20–28)
Calcium: 10.3 mg/dL — ABNORMAL HIGH (ref 8.6–10.2)
Chloride: 102 mmol/L (ref 96–108)
Creatinine: 1.2 mg/dL — ABNORMAL HIGH (ref 0.51–0.95)
Glucose: 74 mg/dL (ref 60–99)
Lab: 17 mg/dL (ref 6–20)
Potassium: 4.6 mmol/L (ref 3.3–5.1)
Sodium: 144 mmol/L (ref 133–145)
eGFR BY CREAT: 46 * — AB

## 2020-03-15 LAB — H. PYLORI ANTIBODY, IGG

## 2020-03-17 ENCOUNTER — Encounter: Payer: Self-pay | Admitting: Family Medicine

## 2020-03-17 DIAGNOSIS — K219 Gastro-esophageal reflux disease without esophagitis: Secondary | ICD-10-CM

## 2020-03-17 DIAGNOSIS — R1013 Epigastric pain: Secondary | ICD-10-CM

## 2020-03-18 NOTE — Addendum Note (Signed)
Addended by: Gaston Islam on: 03/18/2020 09:02 AM     Modules accepted: Orders

## 2020-03-21 ENCOUNTER — Other Ambulatory Visit
Admission: RE | Admit: 2020-03-21 | Discharge: 2020-03-21 | Disposition: A | Discharge status: Home / Self Care | Payer: Medicare (Managed Care) | Source: Ambulatory Visit | Attending: Family Medicine | Admitting: Family Medicine

## 2020-03-21 DIAGNOSIS — K219 Gastro-esophageal reflux disease without esophagitis: Secondary | ICD-10-CM | POA: Insufficient documentation

## 2020-03-21 DIAGNOSIS — R1013 Epigastric pain: Secondary | ICD-10-CM | POA: Insufficient documentation

## 2020-03-21 LAB — H. PYLORI ANTIGEN, STOOL: H. pylori Stool Antigen EIA: 0

## 2020-04-18 ENCOUNTER — Encounter: Payer: Self-pay | Admitting: Family Medicine

## 2020-04-18 ENCOUNTER — Ambulatory Visit: Payer: Medicare (Managed Care) | Admitting: Family Medicine

## 2020-04-18 VITALS — BP 104/68 | HR 71 | Temp 97.4°F | Wt 144.0 lb

## 2020-04-18 DIAGNOSIS — I10 Essential (primary) hypertension: Secondary | ICD-10-CM

## 2020-04-18 DIAGNOSIS — K219 Gastro-esophageal reflux disease without esophagitis: Secondary | ICD-10-CM

## 2020-04-18 DIAGNOSIS — R4189 Other symptoms and signs involving cognitive functions and awareness: Secondary | ICD-10-CM

## 2020-04-18 MED ORDER — OMEPRAZOLE 20 MG PO CPDR *I*
20.0000 mg | DELAYED_RELEASE_CAPSULE | Freq: Every day | ORAL | 1 refills | Status: DC
Start: 2020-04-18 — End: 2020-10-17

## 2020-04-18 MED ORDER — HYDROCHLOROTHIAZIDE 25 MG PO TABS *I*
12.5000 mg | ORAL_TABLET | Freq: Every morning | ORAL | 1 refills | Status: DC
Start: 2020-04-18 — End: 2021-05-01

## 2020-04-18 MED ORDER — OMEPRAZOLE 20 MG PO CPDR *I*
20.0000 mg | DELAYED_RELEASE_CAPSULE | Freq: Every day | ORAL | 1 refills | Status: DC
Start: 2020-04-18 — End: 2020-04-18

## 2020-04-18 NOTE — Progress Notes (Signed)
Patient ID:  [name]  [birth year]    Subjective:    CC:  No chief complaint on file.    HPI    Facial tenderness R side,  Is responding to topical prescription.    Complaining of some difficulty with focus even after I visit few months ago.    Having trouble focusing hemorrhoids tested about 6 months ago and has recently developed difficulty with close vision.    Worsening reflux problems recently with significant flatulence and burping    [name] has a current medication list which includes the following prescription(s): hydrochlorothiazide, fluticasone, amlodipine, mupirocin, simvastatin, acetaminophen, calcium carb-cholecalciferol, aspirin, and omeprazole.  [name] is allergic to fentanyl, oxycodone, environmental allergies, lipitor [atorvastatin], and tramadol.      Medications and allergies were reviewed and reconciled with the patient in eRecord today; and No changes were made.      ROS  [place] HPI      Objective:    Vitals:    04/18/20 0957   BP: 104/68   BP Location: Left arm   Patient Position: Sitting   Pulse: 71   Temp: 36.3 C (97.4 F)   TempSrc: Temporal   SpO2: 99%   Weight: 65.3 kg (144 lb)     Physical Exam  Eyes:      Pupils: Pupils are equal, round, and reactive to light.   Cardiovascular:      Rate and Rhythm: Normal rate and regular rhythm.   Pulmonary:      Effort: Pulmonary effort is normal.   Musculoskeletal:         General: Normal range of motion.      Cervical back: Normal range of motion.   Skin:     General: Skin is warm and dry.   Neurological:      Mental Status: She is alert and oriented to person, place, and time.   Psychiatric:         Mood and Affect: Mood and affect normal.         Judgment: Judgment normal.         No results found for this or any previous visit (from the past 24 hour(s)).    The same is nice colors leaving her with respiratory expiratory sliver assessment/Plan:  Diagnoses and all orders for this visit:    Gastroesophageal reflux disease, unspecified whether  esophagitis present    -     omeprazole (PRILOSEC) 20 mg capsule; Take 1 capsule (20 mg total) by mouth daily (before breakfast)    Essential hypertension  -     hydroCHLOROthiazide (HYDRODIURIL) 25 mg tablet; Take 0.5 tablets (12.5 mg total) by mouth every morning      Follow up in about 4 weeks (around 05/16/2020), or if symptoms worsen or fail to improve.    Romualdo Bolk FNP-C

## 2020-04-25 ENCOUNTER — Other Ambulatory Visit
Admission: RE | Admit: 2020-04-25 | Discharge: 2020-04-25 | Disposition: A | Discharge status: Home / Self Care | Payer: Medicare (Managed Care) | Source: Ambulatory Visit | Attending: Family Medicine | Admitting: Family Medicine

## 2020-04-25 DIAGNOSIS — I10 Essential (primary) hypertension: Secondary | ICD-10-CM | POA: Insufficient documentation

## 2020-04-25 DIAGNOSIS — R4189 Other symptoms and signs involving cognitive functions and awareness: Secondary | ICD-10-CM | POA: Insufficient documentation

## 2020-04-25 LAB — CBC AND DIFFERENTIAL
Baso # K/uL: 0.1 10*3/uL (ref 0.0–0.1)
Basophil %: 1.5 %
Eos # K/uL: 0.2 10*3/uL (ref 0.0–0.4)
Eosinophil %: 2.5 %
Hematocrit: 44 % (ref 34–45)
Hemoglobin: 14.4 g/dL (ref 11.2–15.7)
IMM Granulocytes #: 0 10*3/uL (ref 0.0–0.0)
IMM Granulocytes: 0.3 %
Lymph # K/uL: 1.9 10*3/uL (ref 1.2–3.7)
Lymphocyte %: 31.4 %
MCH: 30 pg (ref 26–32)
MCHC: 32 g/dL (ref 32–36)
MCV: 94 fL (ref 79–95)
Mono # K/uL: 0.7 10*3/uL (ref 0.2–0.9)
Monocyte %: 10.8 %
Neut # K/uL: 3.3 10*3/uL (ref 1.6–6.1)
Nucl RBC # K/uL: 0 10*3/uL (ref 0.0–0.0)
Nucl RBC %: 0 /100 WBC (ref 0.0–0.2)
Platelets: 285 10*3/uL (ref 160–370)
RBC: 4.7 MIL/uL (ref 3.9–5.2)
RDW: 13 % (ref 11.7–14.4)
Seg Neut %: 53.5 %
WBC: 6.1 10*3/uL (ref 4.0–10.0)

## 2020-04-25 LAB — COMPREHENSIVE METABOLIC PANEL
ALT: 17 U/L (ref 0–35)
Albumin: 4.6 g/dL (ref 3.5–5.2)
Alk Phos: 79 U/L (ref 35–105)
Anion Gap: 17 — ABNORMAL HIGH (ref 7–16)
Bilirubin,Total: 0.4 mg/dL (ref 0.0–1.2)
CO2: 21 mmol/L (ref 20–28)
Calcium: 9.8 mg/dL (ref 8.6–10.2)
Chloride: 103 mmol/L (ref 96–108)
Creatinine: 1.06 mg/dL — ABNORMAL HIGH (ref 0.51–0.95)
Glucose: 95 mg/dL (ref 60–99)
Lab: 15 mg/dL (ref 6–20)
Potassium: 4.7 mmol/L (ref 3.3–5.1)
Sodium: 141 mmol/L (ref 133–145)
Total Protein: 7.1 g/dL (ref 6.3–7.7)
eGFR BY CREAT: 53 * — AB

## 2020-04-25 LAB — FOLATE: Folate: >14 ng/mL (ref >=4.6)

## 2020-04-25 LAB — SYPHILIS SCREEN
Syphilis Screen: NEGATIVE
Syphilis Status: NONREACTIVE

## 2020-04-25 LAB — HEMOGLOBIN A1C: Hemoglobin A1C: 5.7 % — ABNORMAL HIGH

## 2020-04-25 LAB — TSH: TSH: 2.34 u[IU]/mL (ref 0.27–4.20)

## 2020-04-25 LAB — RESOLUTION

## 2020-04-25 LAB — VITAMIN B12: Vitamin B12: 455 pg/mL (ref 232–1245)

## 2020-04-25 LAB — IRON: Iron: 111 ug/dL (ref 34–165)

## 2020-04-27 NOTE — Result Encounter Note (Signed)
Review results at next visit

## 2020-05-16 ENCOUNTER — Ambulatory Visit: Payer: Medicare (Managed Care) | Admitting: Family Medicine

## 2020-05-16 ENCOUNTER — Encounter: Payer: Self-pay | Admitting: Family Medicine

## 2020-05-16 VITALS — BP 130/72 | HR 71 | Wt 143.9 lb

## 2020-05-16 DIAGNOSIS — M545 Low back pain, unspecified: Secondary | ICD-10-CM

## 2020-05-16 DIAGNOSIS — R4189 Other symptoms and signs involving cognitive functions and awareness: Secondary | ICD-10-CM

## 2020-05-16 DIAGNOSIS — K219 Gastro-esophageal reflux disease without esophagitis: Secondary | ICD-10-CM

## 2020-05-16 DIAGNOSIS — R413 Other amnesia: Secondary | ICD-10-CM

## 2020-05-16 NOTE — Progress Notes (Addendum)
Patient ID:  [name]  13-Mar-1940    Subjective:    CC:  Back Pain (follow up/ [name] )    HPI    Continues to have increasing pain of lower.  Patient completed nerve surgeries and is offered steroid injections with Dr. Karle Starch has not pursued those.  She is wondering about seeing a chiropractor for her continued chronic and progressive low back pain.  It is now limiting her ability to ambulate any distance.  She is pursuing a walker for the house for her walking stability.    She complains again today of progressive short-term memory issues.    The patient reports improvement with belching and gastritis with use of omeprazole every other day.      [name] has a current medication list which includes the following prescription(s): hydrochlorothiazide, omeprazole, fluticasone, amlodipine, simvastatin, calcium carb-cholecalciferol, aspirin, mupirocin, and acetaminophen.  [name] is allergic to fentanyl, oxycodone, environmental allergies, lipitor [atorvastatin], and tramadol.      Medications and allergies were reviewed and reconciled with the patient in eRecord today; and No changes were made.      ROS as per HPI      Objective:    Vitals:    05/16/20 0953   BP: 130/72   BP Location: Left arm   Pulse: 71   SpO2: 97%   Weight: 65.3 kg (143 lb 14.4 oz)     Physical Exam  Eyes:      Pupils: Pupils are equal, round, and reactive to light.   Cardiovascular:      Rate and Rhythm: Normal rate and regular rhythm.   Pulmonary:      Effort: Pulmonary effort is normal.   Musculoskeletal:         General: Normal range of motion.      Cervical back: Normal range of motion.      Comments:  Patient transfers sitting to standing slowly.  As she first stands she appears slightly unstable but is able to do lunges.  She is able to bend forward towards the chair pick up her sweater.  She does ambulate on evenly as she has down the hall..   Skin:     General: Skin is warm and dry.   Neurological:      Mental Status: She is alert and  oriented to person, place, and time.   Psychiatric:         Mood and Affect: Mood and affect normal.         Judgment: Judgment normal.      Comments: Patient converses clearly.  She has clear memory of events and schedules for this week.  Primary issue is concern regarding word finding.  Concerns about entering her room and forgetting what for.          No results found for this or any previous visit (from the past 24 hour(s)).    Assessment/Plan:  [name] was seen today for back pain.    Diagnoses and all orders for this visit:    Memory deficit  -     AMB REFERRAL TO NEUROLOGY    Cognitive decline    Gastroesophageal reflux disease, unspecified whether esophagitis present    She will continue omeprazole every other day    Lumbago    She will contact her brothers chiropractor.  Or pursue through the insurance company to find out who they cover in the area.  She will also contact Dr. Vista Lawman office to see if he will  proceed with further injections..    Follow up in about 3 months (around 08/16/2020), or if symptoms worsen or fail to improve   BP check.    Romualdo Bolk FNP-C

## 2020-05-28 ENCOUNTER — Other Ambulatory Visit: Payer: Self-pay | Admitting: Family Medicine

## 2020-05-28 DIAGNOSIS — E7800 Pure hypercholesterolemia, unspecified: Secondary | ICD-10-CM

## 2020-06-01 NOTE — Telephone Encounter (Signed)
LOV 05/16/20. No upcoming appt

## 2020-06-13 ENCOUNTER — Encounter: Payer: Self-pay | Admitting: Family Medicine

## 2020-06-21 ENCOUNTER — Encounter: Payer: Self-pay | Admitting: Primary Care

## 2020-06-21 ENCOUNTER — Ambulatory Visit: Payer: Medicare (Managed Care) | Admitting: Primary Care

## 2020-06-21 VITALS — BP 130/78 | HR 41 | Ht 64.17 in | Wt 144.0 lb

## 2020-06-21 DIAGNOSIS — N644 Mastodynia: Secondary | ICD-10-CM

## 2020-06-21 NOTE — Progress Notes (Signed)
[facility] - Office Visit Note     Chief Complaint   Patient presents with    Breast Problem       1) Right breast pain  -Patient began to notice some mild discomfort in her right breast, more so laterally in November 2021  -She had her mammogram done that month, normal, BI-RADS 1, so she did not think too much of this discomfort  -In the past 6+ months she has noticed increase in size of her right breast versus the left, had to go up a cup size (she has not gained weight)  -The right breast continues to be sore, describes the soreness as distally near the nipple  -She denies nipple discharge  -Her younger sister (4 years younger) was treated for breast cancer 10 to 12 years ago, she does not believe her sister did genetic testing  -Lately she has been bothered by thoracic back pain  -She has not lost weight      BP 130/78    Pulse (!) 41    Ht 1.63 m (5' 4.17")    Wt 65.3 kg (144 lb)    LMP  (LMP Unknown)    SpO2 99%    BMI 24.58 kg/m    Wt Readings from Last 3 Encounters:   06/21/20 65.3 kg (144 lb)   05/16/20 65.3 kg (143 lb 14.4 oz)   04/18/20 65.3 kg (144 lb)     GEN: Well-appearing mature adult female, on and off the exam table unaided, pleasant, NAD  Breast: Breasts symmetric and smooth without masses.  Breasts are roughly equal in size to inspection.  Nipples without discharge. No axillary lymphadenopathy  Back: No pain with palpation of the spinous processes, discomfort with palpation of the paraspinal musculature of the thoracic spine    Assessment/Plan:  1) Right Breast Pain/increase in breast size  -Diagnostic mammogram and right breast ultrasound  -I will defer a thoracic spinal x-ray as her discomfort seems to be due to paraspinal muscular tenderness versus vertebral pain on my quick exam  -I did suggest she ask her sister about if she has had genetic testing and the outcome of it    Follow up Next regular appointment        Sherlon Handing. Olusegun Gerstenberger DO

## 2020-06-29 ENCOUNTER — Ambulatory Visit
Admission: RE | Admit: 2020-06-29 | Discharge: 2020-06-29 | Disposition: A | Discharge status: Home / Self Care | Payer: Medicare (Managed Care) | Source: Ambulatory Visit | Attending: Primary Care | Admitting: Primary Care

## 2020-06-29 ENCOUNTER — Ambulatory Visit
Admission: RE | Admit: 2020-06-29 | Discharge: 2020-06-29 | Disposition: A | Discharge status: Home / Self Care | Payer: Medicare (Managed Care) | Source: Ambulatory Visit

## 2020-06-29 ENCOUNTER — Other Ambulatory Visit: Payer: Self-pay | Admitting: Primary Care

## 2020-06-29 DIAGNOSIS — N6315 Unspecified lump in the right breast, overlapping quadrants: Secondary | ICD-10-CM

## 2020-06-29 DIAGNOSIS — N644 Mastodynia: Secondary | ICD-10-CM | POA: Insufficient documentation

## 2020-06-29 DIAGNOSIS — N6311 Unspecified lump in the right breast, upper outer quadrant: Secondary | ICD-10-CM

## 2020-06-29 DIAGNOSIS — N6313 Unspecified lump in the right breast, lower outer quadrant: Secondary | ICD-10-CM

## 2020-06-29 DIAGNOSIS — Z803 Family history of malignant neoplasm of breast: Secondary | ICD-10-CM

## 2020-07-19 ENCOUNTER — Encounter: Payer: Self-pay | Admitting: Family Medicine

## 2020-07-19 DIAGNOSIS — L719 Rosacea, unspecified: Secondary | ICD-10-CM

## 2020-07-22 ENCOUNTER — Telehealth: Payer: Self-pay

## 2020-07-22 NOTE — Telephone Encounter (Signed)
Copied from [place] [id number]. Topic: Appointments - Schedule Appointment  >> Jul 22, 2020  4:01 PM Corliss Parish wrote:  Ms. Bender is calling to schedule an appointment.    Appointment Type: NPV  Provider: Requesting female provider   What does patient need to be seen for?: referral says rosacea - pt stated she is only coming in for a skin check, patient has no hx of skin cancer    Preferred location?: Yes [facility]  Type of insurance: [employer]       Is there a referral on file?: yes  Were the demographics and insurance verified?: Yes  Was the patient connected to [facility] if applicable?: N/A  Was the appropriate expectation set with the patient for a time frame to receive a return call from the office? Yes    Patient can be reached at: [phone]          .

## 2020-07-27 NOTE — Telephone Encounter (Signed)
Patient declined [place] npv, too far for her, I told patient to give [place] a call in beginning of September to see if the new providers' schedules are posted by then, patient needs a [facility] appointment.

## 2020-08-16 ENCOUNTER — Other Ambulatory Visit: Payer: Self-pay | Admitting: Family Medicine

## 2020-08-16 DIAGNOSIS — I10 Essential (primary) hypertension: Secondary | ICD-10-CM

## 2020-09-07 NOTE — Progress Notes (Signed)
[facility]  Outpatient Progress Note     Date:  09/07/2020  Name:  [name]   MRN:  [id number]  DOB:  August 23, 1940     SUBJECTIVE     In brief [name] is a 80 y.o. year old female   has a past medical history of AAA (abdominal aortic aneurysm), Chronic pain, Elevated LFTs, Essential hypertension (06/11/2016), GERD (gastroesophageal reflux disease), High blood pressure, HLD (hyperlipidemia), colonic polyps, Lumbar disc herniation (2006), Osteoporosis, Spondylosis, and Urinary retention.  presenting for an evaluation of Follow-up (right foot pain)    [name] states she had a sharp pain in her left foot just after walking around the mall and [employer] two days ago. When she got home, she immediately took off her shoe and the pain worsened to the extent she was unable to bear weight on it. She wrapped it with compression and placed ice on it. She was able to bear weight on it after several minutes. It has been sore the past 2 days but able to ambulate on it.     Today: she has no pain.   She does endorse chronic back pain she is working with several specialists about and endorses concern of her weight. She feels the heaviest she has been in years despite the number of the scale only being ~5lbs up.     Review of Systems   All other systems reviewed and are negative.      I have reviewed the patient's past medical, surgical, family and medication histories and made appropriate corrections and updates in their respective parts of this chart.     Objective     Vitals:    09/08/20 0956   BP: 138/82   Pulse: 77   Temp: 36.9 C (98.5 F)   Weight: 65.9 kg (145 lb 4.8 oz)    Wt Readings from Last 1 Encounters:   06/21/20 65.3 kg (144 lb)    BP Readings from Last 3 Encounters:   06/21/20 130/78   05/16/20 130/72   04/18/20 104/68        Physical Exam    Physical Exam  Constitutional:       Appearance: Normal appearance. She is normal weight.   HENT:      Head: Normocephalic.   Musculoskeletal:          General: Normal range of motion.      Cervical back: Normal range of motion.      Left foot: Normal range of motion. No swelling, deformity, tenderness or bony tenderness.      Comments: Denies tenderness to palpation or passive ROM. No decreased ROM.   Wearing flat shoes.   No erythema present. No edema. No gait disturbance, able to weight bear at this time.    Skin:     General: Skin is warm and dry.   Neurological:      General: No focal deficit present.      Mental Status: She is alert and oriented to person, place, and time.   Psychiatric:         Mood and Affect: Mood normal.        Assessment and Plan     [name] is a 80 y.o. female with left foot pain.    1. Neuralgia of left foot  Encouraged ambulation with tight fitting compression shoe.   Encouraged use of gel shoe inserts to give support to the arch of the foot to minimize  back pain.   Compression, Ice, Elevation for further pain    2. Sedentary lifestyle  Discussed at length chair exercises she can perform while siting in the recliner that will not aggravate her chronic back pain.   Encourage overhead weighted exercises to increase aerobic activity.   Discussed at length diet as well, reviewed healthier choices.     3. Chronic back pain  Continue to work with specialist. Will see him next week- will inquire about possible ablation   Will follow up with our office regarding their recommendations.         F/U: Follow up if symptoms worsen or fail to improve.    Ala Bent NP  Family Medicine  09/07/20   3:25 PM

## 2020-09-08 ENCOUNTER — Ambulatory Visit: Payer: Medicare (Managed Care)

## 2020-09-08 VITALS — BP 138/82 | HR 77 | Temp 98.5°F | Wt 145.3 lb

## 2020-09-08 DIAGNOSIS — Z9189 Other specified personal risk factors, not elsewhere classified: Secondary | ICD-10-CM

## 2020-09-08 DIAGNOSIS — M792 Neuralgia and neuritis, unspecified: Secondary | ICD-10-CM

## 2020-09-08 DIAGNOSIS — G8929 Other chronic pain: Secondary | ICD-10-CM

## 2020-09-08 DIAGNOSIS — M549 Dorsalgia, unspecified: Secondary | ICD-10-CM

## 2020-09-09 ENCOUNTER — Ambulatory Visit: Payer: Medicare (Managed Care) | Admitting: Physical Medicine and Rehabilitation

## 2020-09-09 ENCOUNTER — Encounter: Payer: Self-pay | Admitting: Physical Medicine and Rehabilitation

## 2020-09-09 VITALS — BP 138/82 | HR 40

## 2020-09-09 DIAGNOSIS — M47816 Spondylosis without myelopathy or radiculopathy, lumbar region: Secondary | ICD-10-CM

## 2020-09-09 NOTE — Progress Notes (Signed)
[name]  November 19, 1940  [id number]    Chief complaint: Follow-up low back pain.    HPI: Patient is seen in follow consultation for chronic low back pain.  Patient denies radiation down the lower extremities.  She underwent therapeutic bilateral S1 (July 21, 2019) and L5 (June 15, 2019) transforaminal epidural steroid injections with Dr. Briant Sites with relief for less than a week.  Patient was referred to Dr. Virl Diamond  She underwent a unilateral L3/4 MILD procedure December 21, 2019 with very short-lived relief of her symptoms.    Patient is here to discuss other treatment options.  Symptoms are aggravated with walking, bending.    Symptoms are mitigated with sitting.    Pain score 7/10    Patient denies  Progressive lower extremity weakness, constitutional symptoms or bowel or bladder dysfunction.    Physical exam:    Vitals:    09/09/20 0953 09/09/20 1020   BP: (!) 184/78 138/82   Pulse: (!) 40      Gait without antalgia. Sitting straight leg raise is negative for causing  lower extremity leg pain. Strength is intact 5 out of 5 in lower extremities across all lumbar myotomes.    Impression: Chronic low back pain possibly facet joint mediated    Plan:    I reviewed with patient Dr. Roselind Rily office note during her follow-up February 2022.  He offered patient medial branch blocks which she declined.    I suggested that she contact Dr.Carinci's office to follow through with the medial branch blocks.    Please feel free to contact me with any questions or concerns that you may have.    Darrick Grinder, [place]

## 2020-10-15 ENCOUNTER — Other Ambulatory Visit: Payer: Self-pay | Admitting: Family Medicine

## 2020-10-15 DIAGNOSIS — K219 Gastro-esophageal reflux disease without esophagitis: Secondary | ICD-10-CM

## 2020-10-17 NOTE — Telephone Encounter (Signed)
Medication Refill Request    Omeprazole 20 mg capsule  Take one capsule by mouth daily    Last office visit: 09/08/2020

## 2020-10-21 ENCOUNTER — Encounter: Payer: Self-pay | Admitting: Family Medicine

## 2020-10-21 DIAGNOSIS — L719 Rosacea, unspecified: Secondary | ICD-10-CM

## 2020-10-24 ENCOUNTER — Telehealth: Payer: Self-pay

## 2020-10-24 NOTE — Telephone Encounter (Signed)
Writer reached out to patient to relay Dr.Schuppert's message in regards to derm referral being placed. Writer gave patient's number to derm to call for derm referral.     FYI

## 2020-10-24 NOTE — Telephone Encounter (Signed)
Mrs called to  Schedule appt.    adv no referral on file as of yet,    She stated referral just sent this morning.   adv we need ref to schedule appt.    asked if sshe could give [place] a few days to rec.   she will call back later this week.

## 2021-01-17 ENCOUNTER — Ambulatory Visit: Payer: Medicare (Managed Care) | Admitting: Family Medicine

## 2021-01-17 ENCOUNTER — Other Ambulatory Visit: Payer: Self-pay

## 2021-01-17 ENCOUNTER — Encounter: Payer: Self-pay | Admitting: Family Medicine

## 2021-01-17 VITALS — BP 136/84 | HR 77 | Ht 64.17 in | Wt 144.4 lb

## 2021-01-17 DIAGNOSIS — G4700 Insomnia, unspecified: Secondary | ICD-10-CM

## 2021-01-17 DIAGNOSIS — M792 Neuralgia and neuritis, unspecified: Secondary | ICD-10-CM

## 2021-01-17 DIAGNOSIS — G8929 Other chronic pain: Secondary | ICD-10-CM

## 2021-01-17 DIAGNOSIS — M549 Dorsalgia, unspecified: Secondary | ICD-10-CM

## 2021-01-17 DIAGNOSIS — N644 Mastodynia: Secondary | ICD-10-CM

## 2021-01-17 DIAGNOSIS — I10 Essential (primary) hypertension: Secondary | ICD-10-CM

## 2021-01-17 LAB — PCMH DEPRESSION ASSESSMENT

## 2021-01-17 LAB — PCMH FALL RISK PLAN

## 2021-01-17 LAB — PCMH FALL RISK ASSESSMENT

## 2021-01-17 NOTE — Patient Instructions (Addendum)
Try melatonin for your sleep   Try 3 mg dose, can increase to ~10 mg nightly if needed     Call Dr. Roselind Rily office to reschedule follow up    Call dermatology to schedule an appointment, let me know if you need a new referral     Let me know if you want a referral to PT for your breast pain  I think this could be related to some pectoralis (or pec muscle) weakness, tightness causing discomfort

## 2021-01-17 NOTE — Progress Notes (Signed)
[facility]  Outpatient Progress Note     Date:  01/17/2021  Name:  [name]   MRN:  [id number]  DOB:  May 01, 1940   Assessment and Plan     [name] is a 81 y.o. female presenting for follow up.    1. Chronic back pain  Encouraged to follow up with Dr. Signe Colt regarding other treatment options     2. Neuralgia of left foot  I suspect she is compensation for spinal issues with gait change causing pain in her left foot  Reviewed importance of supportive footwear, avoiding triggering activities such as walking barefoot   If symptoms persist, or worsen would consider referral to podiatry     3. Essential hypertension  BP is at goal of <140/90. Potassium was last checked on 04/25/2020 and was 4.60mmol/L, which is within acceptable limits. Creatinine was last checked on 04/25/2020 and was 1.06mg /dL, which is within acceptable limits.   Medication regimen as of TODAY is as follows:   Antihypertensive Medications             amLODIPine (NORVASC) 5 mg tablet (Taking) TAKE 1 TABLET BY MOUTH EVERY DAY    hydroCHLOROthiazide (HYDRODIURIL) 25 mg tablet (Taking) Take 0.5 tablets (12.5 mg total) by mouth every morning        4. Insomnia  Recommend trial of melatonin for sleep  Reviewed that insomnia is common with aging, pain (often waking to reposition), and urinary complaints like OAB    5. Breast pain, right  I suspect that her pain is muscular in nature.   Reviewed reassuring imaging. As symptoms have not changed since this was done, I did not recommend sooner mammogram.   She was wearing an elastic bra without support or structure. While wire bras can cause musculoskeletal discomfort, so can wearing bras that lack any structural support. I recommended that she look into having a bra fitting done and consider trying a new wireless bra. If her symptoms fail to improve, would recommend a trial of PT.         F/U: Follow up in about 3 months (around 04/17/2021) for [place].     SUBJECTIVE     Chief Complaint    Patient presents with    sore breast bone       1. Breast pain -   Issue present since November 2021  Mammogram was normal (BI-RADS 1)  Saw Dr. Campbell Stall for evaluation in June, patient felt her breast size had changed   Diagnostic mammogram and ultrasound were normal    She reports: symptoms have not changed   Anything that rubs is painful  Stopped wearing bras  Now wearing sports bra  No lump, rash, or obvious deformities     2. Back pain - Facet arthropathy, lumbar   Dr. Signe Colt (Pain Medicine)  Saw PM&R as well   Offered medial branch blocks which she declined previously     3. Foot pain -  Left foot   Has hardwood floors  Uses socks with pads on them or walks barefoot   Felt like she has a bone spur  Pads with shoes help     4. Dry skin -   Brown spots raised over abdomen would like checked today     5. Legs -  Reports they feel weak  2 floor house   When she gets into car she can get in ok with right leg but left leg is more challenging, pain  extends into lower back  Can't walk long distances because of back pain   No other exercise currently (in the past did some PT exercises daily but had pain with them)    5. Sleep concern -  Not sleeping well at night   Maybe 3-4 hours     6. Bladder concern -  Has to wear pads   Will have urge incontinence   Drinks a lot of water, coffee due to dry mouth       ROS   Per above    I have reviewed the patient's past medical, surgical, family and medication histories and made appropriate corrections and updates in their respective parts of this chart.     Objective     Vitals:    01/17/21 0936   BP: 136/84   Pulse: 77   Weight: 65.5 kg (144 lb 6.4 oz)   Height: 1.63 m (5' 4.17")    Wt Readings from Last 3 Encounters:   01/17/21 65.5 kg (144 lb 6.4 oz)   09/08/20 65.9 kg (145 lb 4.8 oz)   06/21/20 65.3 kg (144 lb)    BP Readings from Last 3 Encounters:   01/17/21 136/84   09/09/20 138/82   09/08/20 138/82        Physical Exam  General appearance: Well appearing, in NAD.  Chest:  No chest deformity, asymmetry. Normal contours. No nodules, masses, or axillary adenopathy. No nipple discharge. Right breast non TTP.   Cardiovascular:  Warm and well perfused.  Pulmonary:  Non-labored breathing.  Skin: Warm and dry. Several SKs on trunk, benign appearing.   Neuro:  Alert and oriented. Responding appropriately in conversation.     Labs:  Lab Results   Component Value Date    NA 141 04/25/2020    K 4.7 04/25/2020    CL 103 04/25/2020    CO2 21 04/25/2020    UN 15 04/25/2020    CREAT 1.06 (H) 04/25/2020    VID25 56 09/21/2019    VB12 455 04/25/2020    WBC 6.1 04/25/2020    HGB 14.4 04/25/2020    HCT 44 04/25/2020    PLT 285 04/25/2020    TSH 2.34 04/25/2020    HA1C 5.7 (H) 04/25/2020    CHOL 208 (!) 09/21/2019    TRIG 83 09/21/2019    HDL 72 (H) 09/21/2019    LDLC 119 09/21/2019    [place] 2.9 09/21/2019       Tillie Fantasia, MD  Family Medicine  01/17/21   10:06 AM

## 2021-01-23 ENCOUNTER — Other Ambulatory Visit: Payer: Self-pay

## 2021-01-23 ENCOUNTER — Ambulatory Visit
Admission: RE | Admit: 2021-01-23 | Discharge: 2021-01-23 | Disposition: A | Discharge status: Home / Self Care | Payer: Medicare (Managed Care) | Source: Ambulatory Visit | Attending: Vascular Surgery | Admitting: Vascular Surgery

## 2021-01-23 ENCOUNTER — Encounter: Payer: Self-pay | Admitting: Radiology

## 2021-01-23 DIAGNOSIS — I714 Abdominal aortic aneurysm, without rupture, unspecified: Secondary | ICD-10-CM | POA: Insufficient documentation

## 2021-01-23 DIAGNOSIS — I701 Atherosclerosis of renal artery: Secondary | ICD-10-CM | POA: Insufficient documentation

## 2021-01-23 DIAGNOSIS — Z8679 Personal history of other diseases of the circulatory system: Secondary | ICD-10-CM | POA: Insufficient documentation

## 2021-01-23 LAB — POCT CREATININE
Creatinine, POCT: 1 mg/dL — ABNORMAL HIGH (ref 0.51–0.95)
eGFR BY CREAT: 56 *

## 2021-01-23 MED ORDER — IOHEXOL 350 MG/ML (OMNIPAQUE) IV SOLN *I*
1.0000 mL | Freq: Once | INTRAVENOUS | Status: AC
Start: 2021-01-23 — End: 2021-01-23
  Administered 2021-01-23: 97 mL via INTRAVENOUS

## 2021-02-06 ENCOUNTER — Other Ambulatory Visit: Payer: Self-pay

## 2021-02-06 ENCOUNTER — Ambulatory Visit
Admission: RE | Admit: 2021-02-06 | Discharge: 2021-02-06 | Disposition: A | Discharge status: Home / Self Care | Payer: Medicare (Managed Care) | Source: Ambulatory Visit | Attending: Vascular Surgery | Admitting: Vascular Surgery

## 2021-02-06 ENCOUNTER — Ambulatory Visit: Payer: Medicare (Managed Care) | Admitting: Vascular Surgery

## 2021-02-06 DIAGNOSIS — Z8679 Personal history of other diseases of the circulatory system: Secondary | ICD-10-CM | POA: Insufficient documentation

## 2021-02-06 DIAGNOSIS — Z9889 Other specified postprocedural states: Secondary | ICD-10-CM | POA: Insufficient documentation

## 2021-02-06 DIAGNOSIS — Z95828 Presence of other vascular implants and grafts: Secondary | ICD-10-CM | POA: Insufficient documentation

## 2021-02-06 DIAGNOSIS — I714 Abdominal aortic aneurysm, without rupture, unspecified: Secondary | ICD-10-CM | POA: Insufficient documentation

## 2021-02-06 LAB — CV DOPPLER AORTA COMPLETE
Aorta Diameter A-P Distal: 2.89 cm
Aorta Diameter A-P Mid: 2.76 cm
Aorta Diameter A-P Prox: 2.53 cm
Aorta Diameter Trans Distal: 3.2 cm
Aorta Diameter Trans Mid: 2.65 cm
Aorta Diameter Trans Prox: 2.46 cm
Aorta EDV Distal: 0 cm/s
Aorta EDV Mid: 0 cm/s
Aorta EDV Prox: 0 cm/s
Aorta PSV Distal: 18.66 cm/s
Aorta PSV Mid: 24.36 cm/s
Aorta PSV Prox: 26.49 cm/s
Left Common Iliac Artery Prox AP Diameter: 1.36 cm
Left Common Iliac Artery Prox EDV: 0 cm/s
Left Common Iliac Artery Prox PSV: 109.42 cm/s
Left Common Iliac Artery Prox Trans Diameter: 1.17 cm
Right Common Iliac Artery Prox AP Diameter: 1.42 cm
Right Common Iliac Artery Prox EDV: 0 cm/s
Right Common Iliac Artery Prox PSV: 72.43 cm/s
Right Common Iliac Artery Prox Trans Diameter: 1.32 cm

## 2021-02-14 ENCOUNTER — Other Ambulatory Visit: Payer: Self-pay | Admitting: Family Medicine

## 2021-02-14 DIAGNOSIS — I10 Essential (primary) hypertension: Secondary | ICD-10-CM

## 2021-02-14 MED ORDER — AMLODIPINE BESYLATE 5 MG PO TABS *I*
5.0000 mg | ORAL_TABLET | Freq: Every day | ORAL | 1 refills | Status: DC
Start: 2021-02-14 — End: 2021-08-01

## 2021-02-14 NOTE — Telephone Encounter (Signed)
Last office Visit: 01/17/2021   Next office Visit: 04/20/2021   Recent PCP visit: Recent Visits  Date Type Provider Dept   01/17/21 Office Visit Schuppert, Bryson Ha, MD [facility]   Showing recent visits within past 185 days in an active department and meeting all other requirements  Future Appointments  No visits were found meeting these conditions.  Showing future appointments within next 0 days in an active department and meeting all other requirements       LABORATORY & DATA:  Renal Func  Lab Results   Component Value Date/Time    K 4.7 04/25/2020 08:44 AM    CREAT 1.06 (H) 04/25/2020 08:44 AM    [place] 53 (!) 04/25/2020 08:44 AM    CA 9.8 04/25/2020 08:44 AM    PO4 3.6 11/14/2016 09:44 PM    TFT  Lab Results   Component Value Date/Time    TSH 2.34 04/25/2020 08:44 AM      Lipids  Lab Results   Component Value Date/Time    CHOL 208 (!) 09/21/2019 09:29 AM    HDL 72 (H) 09/21/2019 09:29 AM    LDLC 119 09/21/2019 09:29 AM    TRIG 83 09/21/2019 09:29 AM    A1c  Lab Results   Component Value Date/Time    HA1C 5.7 (H) 04/25/2020 08:44 AM    HA1C 5.7 (H) 09/21/2019 09:29 AM    HA1C 5.7 11/05/2016 02:42 PM      LFTs  Lab Results   Component Value Date/Time    AST CANCELED 04/25/2020 08:44 AM    ALT 17 04/25/2020 08:44 AM    HTBIL 0.2 06/18/2013 12:00 AM    PT/INR  Lab Results   Component Value Date/Time    INR 1.0 11/05/2016 02:42 PM    PTI 11.7 11/05/2016 02:42 PM      WBC  Lab Results   Component Value Date/Time    WBC 6.1 04/25/2020 08:44 AM    HGB 14.4 04/25/2020 08:44 AM    HCT 44 04/25/2020 08:44 AM    PLT 285 04/25/2020 08:44 AM    Inflammatory Markers  Lab Results   Component Value Date/Time    ESR 9 09/21/2019 09:29 AM    CRP 4 10/09/2017 10:50 AM      UCDS: No results found for: [id number]

## 2021-04-20 ENCOUNTER — Ambulatory Visit: Payer: Medicare (Managed Care) | Admitting: Family Medicine

## 2021-04-30 ENCOUNTER — Other Ambulatory Visit: Payer: Self-pay | Admitting: Family Medicine

## 2021-04-30 DIAGNOSIS — I10 Essential (primary) hypertension: Secondary | ICD-10-CM

## 2021-05-01 NOTE — Telephone Encounter (Signed)
Last office Visit: 01/17/2021   Next office Visit: 05/03/2021   Recent PCP visit: Recent Visits  Date Type Provider Dept   01/17/21 Office Visit Schuppert, Jill Side, MD [facility]   Showing recent visits within past 185 days in an active department and meeting all other requirements  Future Appointments  No visits were found meeting these conditions.  Showing future appointments within next 0 days in an active department and meeting all other requirements       LABORATORY & DATA:  Renal Func  Lab Results   Component Value Date/Time    K 4.7 04/25/2020 08:44 AM    CREAT 1.06 (H) 04/25/2020 08:44 AM    RFGRC 53 (!) 04/25/2020 08:44 AM    CA 9.8 04/25/2020 08:44 AM    PO4 3.6 11/14/2016 09:44 PM    TFT  Lab Results   Component Value Date/Time    TSH 2.34 04/25/2020 08:44 AM      Lipids  Lab Results   Component Value Date/Time    CHOL 208 (!) 09/21/2019 09:29 AM    HDL 72 (H) 09/21/2019 09:29 AM    LDLC 119 09/21/2019 09:29 AM    TRIG 83 09/21/2019 09:29 AM    A1c  Lab Results   Component Value Date/Time    HA1C 5.7 (H) 04/25/2020 08:44 AM    HA1C 5.7 (H) 09/21/2019 09:29 AM    HA1C 5.7 11/05/2016 02:42 PM      LFTs  Lab Results   Component Value Date/Time    AST CANCELED 04/25/2020 08:44 AM    ALT 17 04/25/2020 08:44 AM    HTBIL 0.2 06/18/2013 12:00 AM    PT/INR  Lab Results   Component Value Date/Time    INR 1.0 11/05/2016 02:42 PM    PTI 11.7 11/05/2016 02:42 PM      WBC  Lab Results   Component Value Date/Time    WBC 6.1 04/25/2020 08:44 AM    HGB 14.4 04/25/2020 08:44 AM    HCT 44 04/25/2020 08:44 AM    PLT 285 04/25/2020 08:44 AM    Inflammatory Markers  Lab Results   Component Value Date/Time    ESR 9 09/21/2019 09:29 AM    CRP 4 10/09/2017 10:50 AM      UCDS: No results found for: [id number]

## 2021-05-03 ENCOUNTER — Ambulatory Visit: Payer: Medicare (Managed Care) | Admitting: Family Medicine

## 2021-05-03 ENCOUNTER — Other Ambulatory Visit: Payer: Self-pay

## 2021-05-03 ENCOUNTER — Encounter: Payer: Self-pay | Admitting: Family Medicine

## 2021-05-03 VITALS — BP 124/78 | HR 86 | Ht 63.0 in | Wt 145.0 lb

## 2021-05-03 DIAGNOSIS — E785 Hyperlipidemia, unspecified: Secondary | ICD-10-CM

## 2021-05-03 DIAGNOSIS — Z0000 Encounter for general adult medical examination without abnormal findings: Secondary | ICD-10-CM

## 2021-05-03 DIAGNOSIS — M549 Dorsalgia, unspecified: Secondary | ICD-10-CM

## 2021-05-03 DIAGNOSIS — K219 Gastro-esophageal reflux disease without esophagitis: Secondary | ICD-10-CM

## 2021-05-03 DIAGNOSIS — M810 Age-related osteoporosis without current pathological fracture: Secondary | ICD-10-CM

## 2021-05-03 DIAGNOSIS — G8929 Other chronic pain: Secondary | ICD-10-CM

## 2021-05-03 DIAGNOSIS — I10 Essential (primary) hypertension: Secondary | ICD-10-CM

## 2021-05-03 LAB — PCMH FALL RISK ASSESSMENT

## 2021-05-03 LAB — PCMH FALL RISK PLAN

## 2021-05-03 LAB — PCMH DEPRESSION ASSESSMENT

## 2021-05-03 NOTE — Progress Notes (Signed)
Visit performed as:             Office Visit, met with patient in person    Today we reviewed and updated [name]'s smoking status, activities of daily living, depression screen, fall risk, medications and allergies.   I have counseled the patient in the above areas.     Subjective:     Chief Complaint: [name] is a 81 y.o. female here for a/an Annual Exam and Subsequent Annual Medicare Visit (Patient reports middle to lower back pain)    In general, [name] rates their overall health as:  poor    Reports continued struggles with low back pain  She has not followed up with Dr. Barnet Glasgow yet  She previously decided against spinal injections as they were not very helpful for symptoms     HTN -  Has not checked BP out of office recently  Taking medication as prescribed     GERD -symptoms stable with daily omeprazole     Insomnia - tried CBD gummies and this has helped  However she finds she is sleepy during the day sometimes after using this  Wondering if CBD may be helpful for her chronic pain     Also having insurance issues, would like to speak with a CM       Patient Care Team:  [name], MD as PCP - General (Family Medicine)  Ermalinda Memos, MD as Consulting Provider (Cardiology)  Ernestine Mcmurray, MD as Consulting Provider (Physical Medicine and Rehabilitation)  Newhall, Gerhard Perches, MD as Consulting Provider (Vascular Surgery)     Current Outpatient Medications on File Prior to Visit   Medication Sig Dispense Refill   . hydroCHLOROthiazide (HYDRODIURIL) 25 mg tablet TAKE 1/2 TABLET BY MOUTH EVERY MORNING 90 tablet 1   . amLODIPine (NORVASC) 5 mg tablet Take 1 tablet (5 mg total) by mouth daily 90 tablet 1   . omeprazole (PRILOSEC) 20 mg capsule TAKE 1 CAPSULE BY MOUTH EVERY DAY BEFORE BREAKFAST 90 capsule 1   . simvastatin (ZOCOR) 20 mg tablet TAKE 1 TABLET BY MOUTH EVERY DAY WITH DINNER 90 tablet 3   . Calcium Carbonate-Vitamin D (CALCIUM 500 + D PO) Take 1 tablet by mouth 2 times  daily     . aspirin 81 MG tablet Take 1 tablet (81 mg total) by mouth daily 90 tablet 1   . melatonin 3 mg Take 1 tablet (3 mg total) by mouth nightly as needed for Sleep     . acetaminophen (TYLENOL) 325 MG tablet Take by mouth every 6 hours as needed for Pain       No current facility-administered medications on file prior to visit.     Allergies   Allergen Reactions   . Fentanyl Nausea And Vomiting   . Oxycodone Nausea And Vomiting     When in hospital for AAA repair   . Environmental Allergies Other (See Comments)     Nasal congestion   . Lipitor [Atorvastatin] Other (See Comments)     Elevated LFTs   . Tramadol Other (See Comments)     "felt like a zombie"     Patient Active Problem List    Diagnosis Date Noted   . Essential hypertension 06/11/2016     Priority: High   . Osteoporosis 10/15/2013     Priority: High     Fosamax started 2015?     Marland Kitchen Low back pain 10/15/2013     Priority:  High       Saw Dr Mitzie Na.        . Benign neoplasm of colon 11/03/2013     Priority: Medium     Removed via colonscopy 2012; told to repeat 2017      . AAA (abdominal aortic aneurysm) 10/15/2013     Priority: Medium     Due for repeat U/S 09/2016 ; Noted 06/02/13: 3.8X3.5 cm partially thrombosed; 03/2016 stable at 4.77 cm- continued surveillance with vascular surgery               . Hyperlipidemia 10/15/2013     Priority: Medium   . Wears dentures 06/11/2016     Priority: Low   . Patient has healthcare proxy 06/11/2016     Priority: Low     Drexel Iha (son), Tarri Fuller (sister)      . Gallstones 10/23/2013     Priority: Low     Noted U/S: 06/02/13: There is gallbladder sludge/crushed gallstones. No gallbladder wall thickening, no sonographic Murphy's sign. Common bile duct 4mm.      Marland Kitchen GERD (gastroesophageal reflux disease) 10/15/2013     Priority: Low   . Allergic rhinitis 11/21/2017   . Positive colorectal cancer screening using Cologuard test 09/25/2017   . Claustrophobia 09/25/2017   . AAA (abdominal aortic aneurysm) without  rupture - s/p EVAR & Right CFA endarterectomy 11/14/16 10/26/2016     Past Medical History:   Diagnosis Date   . AAA (abdominal aortic aneurysm)    . Chronic pain    . Elevated LFTs     Unclear if this was related to statin, gallstones, alcohol consumption    . Essential hypertension 06/11/2016   . GERD (gastroesophageal reflux disease)    . High blood pressure    . HLD (hyperlipidemia)    . Hx of colonic polyps     Colonscopy 08/11/10; repeat 2017 per GI    . 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 Osteoporosis    . Spondylosis    . Urinary retention      Past Surgical History:   Procedure Laterality Date   . cataract repair Bilateral    . COLONOSCOPY  2011   . HYSTERECTOMY      left ovaries in place    . TUBAL LIGATION       Family History   Problem Relation Age of Onset   . Dementia Mother 85   . Cancer Mother         lung   . High cholesterol Mother    . Stroke Mother    . Parkinsonism Father    . Heart Disease Father 82        passed   . High cholesterol Father    . Heart attack Father    . Cancer Sister         Breast; in recovery   . High cholesterol Sister    . Hip fracture Sister    . Breast cancer Sister 79   . Cancer Brother         prostate   . High cholesterol Brother    . Diabetes Brother    . Aneurysm Neg Hx    . Anesthesia problems Neg Hx    . Colon cancer Neg Hx    . Colon polyps Neg Hx    . Ovarian cancer Neg Hx      Social History     Socioeconomic History   .  Marital status: Divorced   Tobacco Use   . Smoking status: Former     Packs/day: 1.00     Years: 30.00     Pack years: 30.00     Types: Cigarettes     Quit date: 06/15/2013     Years since quitting: 7.8   . Smokeless tobacco: Former   Substance and Sexual Activity   . Alcohol use: Yes     Alcohol/week: 14.0 standard drinks of alcohol     Types: 14 Glasses of wine per week     Comment: 2/day   . Drug use: No   . Sexual activity: Not Currently   Social History Narrative    Lives alone currently in townhome     2 brothers and 2 sisters that live  here    2 boys ([place], [place])    Grandchildren        Objective:     Vital Signs: BP 145/74   Pulse 86   Ht 1.6 m (5\' 3" )   Wt 65.8 kg (145 lb)   LMP  (LMP Unknown)   SpO2 96%   BMI 25.69 kg/m    BMI: Body mass index is 25.69 kg/m.    Vision Screening Results (Welcome visit only):  No results found.    Depression Screening Results:  Recent Review Flowsheet Data     PHQ Scores 05/03/2021 01/17/2021 09/08/2020 02/17/2020 12/04/2019 08/04/2018 06/17/2018    PSQ2 Q1 - Interest/Pleasure - - - - - Y  Y    PSQ2 Q2 - Down, Depressed, Hopeless - - - - - Y  Y    PHQ Q9 - Better Off Dead - - - - - - 0    PHQ Calculated Score 0 1 0 4 2 - 17    PHQ Manual Score - - - - - - 17        Opioid Use/DAST- 10 Screening Results:   How many times in the past year have you used an illegal drug or used a prescription medication for nonmedical reasons?: 0 (05/03/2021  9:39 AM)    Activities of Daily Living/Functional Screening Results:  Is the person deaf or does he/she have serious difficulty hearing?: N  Is this person blind or does he/she have serious difficulty seeing even when wearing glasses?: N  *Vision Status: Visual aid   Does this person have serious difficulty walking or climbing stairs?: Y  *Walks in Home: Independent  *Climbing Stairs: Needs Assistance  Does this person have difficulty dressing or bathing?: N  *Shopping: Independent  *House Keeping: Independent  *Managing Own Medications: Independent  *Handling Finances: Independent  Difficulty doing errands due to a physicial, mental or emotional condition: Yes (lifting bags when shopping per patient)  Difficulty remembering or making decisions due to a physicial, mental or emotional condition: Yes (have some difficulty at times per patient)      Fall Risk Screening Results:  Have you fallen in the last year?: No  Do you feel you are at risk for falling?: No  Do you feel your risk of falling is: : Moderate  Would you like to learn more about how to reduce your risk of  falling?: No      Assessment and Plan:     Cognitive Function:  Recall of recent and remote events appears:  Normal      Advanced Care Planning:  HCP paperwork can be found in the scanned media section     The following health  maintenance plan was reviewed with the patient:    Health Maintenance Topics with due status: Overdue       Topic Date Due    IMM-ZOSTER Never done     Health Maintenance Topics with due status: Postponed       Topic Postponed Until    HIV Screening USPSTF/[place] 05/04/2022 (Originally 08/29/1953)     Health Maintenance Topics with due status: Not Due       Topic Last Completion Date    IMM DTaP/Tdap/Td 06/11/2016    DEPRESSION SCREEN MONTHLY 05/03/2021    Fall Risk Screening 05/03/2021     Health Maintenance Topics with due status: Completed       Topic Last Completion Date    IMM Pneumo: 65+ Years 12/10/2014    IMM-INFLUENZA 09/06/2020    COVID-19 Vaccine 10/12/2020     Health Maintenance Topics with due status: Discontinued       Topic Date Due    Breast Cancer Screening Other Discontinued    Osteoporosis Screening Other Discontinued     This health maintenance schedule, identified risks, a list of orders placed today and patient goals have been provided to Erin Fulling in the after visit summary.     Plan for any concerns identified during screening or risk assessments:  1. Preventative health care    2. Essential hypertension  BP is at goal of <140/90. No Potassium on file in the past 365 days. No Creatinine on file in the past 365 days. Therefore, patient was encouraged to get lab work done to monitor renal function.  Medication regimen as of TODAY is as follows:   Antihypertensive Medications             hydroCHLOROthiazide (HYDRODIURIL) 25 mg tablet (Taking) TAKE 1/2 TABLET BY MOUTH EVERY MORNING    amLODIPine (NORVASC) 5 mg tablet (Taking) Take 1 tablet (5 mg total) by mouth daily        - CBC; Future  - Comprehensive metabolic panel; Future  - Hemoglobin A1c; Future    3.  Hyperlipidemia  Continue simvastatin daily   - Lipid Panel (Reflex to Direct  LDL if Triglycerides more than 400); Future    4. GERD (gastroesophageal reflux disease)  Stable, well controlled  Continue omeprazole   - Vitamin B12; Future    5. Age-related osteoporosis without current pathological fracture  - Vitamin D; Future    6. Chronic back pain  Reviewed limited data on use of CBD for chronic pain, may be helpful. She will considering trying. I also encouraged her to follow up with Dr. Barnet Glasgow as there may be other alternative treatments than the injections she could consider.     Reviewed that patient qualifies for a 2nd bivalent COVID booster  I recommended the shingles vaccine, she declined today    Will route chart to our care manager, Edythe Lynn RN to see if she can assist with patient's insurance questions     [name], MD

## 2021-05-03 NOTE — Patient Instructions (Addendum)
Thank you for completing your Annual Exam and Subsequent Annual Medicare Visit (Patient reports middle to lower back pain)   with [place] today.     The purpose of this visits was to:    Screen for disease  Assess risk of future medical problems  Help develop a healthy lifestyle  Update vaccines  Get to know your doctor in case of an illness    Patient Care Team:  [name], MD as PCP - General (Family Medicine)  Ermalinda Memos, MD as Consulting Provider (Cardiology)  Ernestine Mcmurray, MD as Consulting Provider (Physical Medicine and Rehabilitation)  Newhall, Gerhard Perches, MD as Consulting Provider (Vascular Surgery)     To do:   - Get blood work done fasting at any [facility] lab   - I recommend the shingles vaccine (shingrix)   - You can schedule an appointment if you are interested for the bivalent COVID booster at your local pharmacy     Dermatologists on the [facility] side:  [facility] in the [place] area  [facility] around [place]     Send me a MyChart message with the name of the allergy medication you are taking       Medicare 5 Year Plan    The following items were identified as areas of concern during your screening today:  High Blood Pressure (Hypertension) - This is a risk factor for Heart Attack, Stroke, Kidney Problems and Eye Problems.   BMI greater than 25 - This is a risk for Heart Attack, Stroke, High Blood Pressure, Diabetes, High Cholesterol and other complications.       The Health Maintenance table below identifies screening tests and immunizations recommended by your health care team:  Health Maintenance: These screening recommendations are based on USPSTF, [employer], and [place] state guidelines   Topic Date Due    HIV Screening  Never done    Shingles Vaccine (1 of 2) Never done    DEPRESSION SCREEN MONTHLY  06/03/2021    Fall Risk Screening  05/04/2022    DTaP/Tdap/Td Vaccines (2 - Td or Tdap) 06/12/2026    Flu Shot  Completed    Pneumococcal  Vaccination  Completed    COVID-19 Vaccine  Completed    Breast Cancer Screening  Discontinued    Osteoporosis Screening  Discontinued     In addition, goals and orders placed to address these recommendations are listed in the "Today's Visit" section.    We wish you the best of health and look forward to seeing you again next year for your Annual Medicare Wellness Visit.     If you have any health care concerns before then, please do not hesitate to contact us.

## 2021-05-04 ENCOUNTER — Other Ambulatory Visit: Payer: Self-pay

## 2021-05-04 NOTE — Progress Notes (Signed)
CM performed outreach to patient per PCP request.   Patient states she received a letter in the mail "about a month ago" stating she may be eligible for Medicaid. She states she completed the form "just to see if I was eligible". Patient states she received a follow up letter with a "case number", she is unsure if she has Medicaid. She is concerned this may effect her [place] financial assistance. CM assured patient [place] financial assistance is a different program and not insurance related, patient was happy to hear this.   CM offered to outreach to UR certified application counselor to request they contact patient to address her Medicaid questions, patient agrees. CM provided patient with Financial counseling and outpatient registration contact Carolina Cellar contact information [phone] and advised CM would email her to request outreach. Patient thanked CM.   CM will remain available as needed.

## 2021-05-05 NOTE — Progress Notes (Signed)
UR financial services email:    Good morning.  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  I spoke with her yesterday afternoon and explained that this is a good benefit for her.

## 2021-05-08 ENCOUNTER — Other Ambulatory Visit: Payer: Self-pay | Admitting: Family Medicine

## 2021-05-08 DIAGNOSIS — E7800 Pure hypercholesterolemia, unspecified: Secondary | ICD-10-CM

## 2021-05-08 MED ORDER — SIMVASTATIN 20 MG PO TABS *I*
20.0000 mg | ORAL_TABLET | Freq: Every day | ORAL | 0 refills | Status: DC
Start: 2021-05-08 — End: 2021-08-01

## 2021-05-08 NOTE — Telephone Encounter (Signed)
Last office Visit: 05/03/2021   Next office Visit: Visit date not found   Recent PCP visit: Recent Visits  Date Type Provider Dept   05/03/21 Office Visit [name], MD [facility]   01/17/21 Office Visit Schuppert, Jill Side, MD [facility]   Showing recent visits within past 185 days in an active department and meeting all other requirements  Future Appointments  No visits were found meeting these conditions.  Showing future appointments within next 0 days in an active department and meeting all other requirements       LABORATORY & DATA:  Renal Func  Lab Results   Component Value Date/Time    K 4.7 04/25/2020 08:44 AM    CREAT 1.06 (H) 04/25/2020 08:44 AM    RFGRC 53 (!) 04/25/2020 08:44 AM    CA 9.8 04/25/2020 08:44 AM    PO4 3.6 11/14/2016 09:44 PM    TFT  Lab Results   Component Value Date/Time    TSH 2.34 04/25/2020 08:44 AM      Lipids  Lab Results   Component Value Date/Time    CHOL 208 (!) 09/21/2019 09:29 AM    HDL 72 (H) 09/21/2019 09:29 AM    LDLC 119 09/21/2019 09:29 AM    TRIG 83 09/21/2019 09:29 AM    A1c  Lab Results   Component Value Date/Time    HA1C 5.7 (H) 04/25/2020 08:44 AM    HA1C 5.7 (H) 09/21/2019 09:29 AM    HA1C 5.7 11/05/2016 02:42 PM      LFTs  Lab Results   Component Value Date/Time    AST CANCELED 04/25/2020 08:44 AM    ALT 17 04/25/2020 08:44 AM    HTBIL 0.2 06/18/2013 12:00 AM    PT/INR  Lab Results   Component Value Date/Time    INR 1.0 11/05/2016 02:42 PM    PTI 11.7 11/05/2016 02:42 PM      WBC  Lab Results   Component Value Date/Time    WBC 6.1 04/25/2020 08:44 AM    HGB 14.4 04/25/2020 08:44 AM    HCT 44 04/25/2020 08:44 AM    PLT 285 04/25/2020 08:44 AM    Inflammatory Markers  Lab Results   Component Value Date/Time    ESR 9 09/21/2019 09:29 AM    CRP 4 10/09/2017 10:50 AM      UCDS: No results found for: [id number]

## 2021-05-10 ENCOUNTER — Encounter: Payer: Self-pay | Admitting: Family Medicine

## 2021-05-12 NOTE — Telephone Encounter (Signed)
Writer spoke with patient who is aware of labs and will try to do them next week    FYI

## 2021-05-16 ENCOUNTER — Other Ambulatory Visit
Admission: RE | Admit: 2021-05-16 | Discharge: 2021-05-16 | Disposition: A | Discharge status: Home / Self Care | Payer: Medicare (Managed Care) | Source: Ambulatory Visit | Attending: Family Medicine | Admitting: Family Medicine

## 2021-05-16 DIAGNOSIS — E785 Hyperlipidemia, unspecified: Secondary | ICD-10-CM | POA: Insufficient documentation

## 2021-05-16 DIAGNOSIS — K219 Gastro-esophageal reflux disease without esophagitis: Secondary | ICD-10-CM | POA: Insufficient documentation

## 2021-05-16 DIAGNOSIS — M810 Age-related osteoporosis without current pathological fracture: Secondary | ICD-10-CM | POA: Insufficient documentation

## 2021-05-16 DIAGNOSIS — I10 Essential (primary) hypertension: Secondary | ICD-10-CM | POA: Insufficient documentation

## 2021-05-16 LAB — COMPREHENSIVE METABOLIC PANEL
ALT: 20 U/L (ref 0–35)
AST: 27 U/L (ref 0–35)
Albumin: 4.6 g/dL (ref 3.5–5.2)
Alk Phos: 83 U/L (ref 35–105)
Anion Gap: 13 (ref 7–16)
Bilirubin,Total: 0.3 mg/dL (ref 0.0–1.2)
CO2: 26 mmol/L (ref 20–28)
Calcium: 10 mg/dL (ref 8.6–10.2)
Chloride: 100 mmol/L (ref 96–108)
Creatinine: 1.05 mg/dL — ABNORMAL HIGH (ref 0.51–0.95)
Glucose: 101 mg/dL — ABNORMAL HIGH (ref 60–99)
Lab: 15 mg/dL (ref 6–20)
Potassium: 4.5 mmol/L (ref 3.3–5.1)
Sodium: 139 mmol/L (ref 133–145)
Total Protein: 6.8 g/dL (ref 6.3–7.7)
eGFR BY CREAT: 53 * — AB

## 2021-05-16 LAB — CBC
Hematocrit: 42 % (ref 34–45)
Hemoglobin: 13.5 g/dL (ref 11.2–15.7)
MCH: 30 pg (ref 26–32)
MCHC: 32 g/dL (ref 32–36)
MCV: 92 fL (ref 79–95)
Platelets: 328 10*3/uL (ref 160–370)
RBC: 4.6 MIL/uL (ref 3.9–5.2)
RDW: 13.2 % (ref 11.7–14.4)
WBC: 5.3 10*3/uL (ref 4.0–10.0)

## 2021-05-16 LAB — LIPID PANEL
Chol/HDL Ratio: 2.8
Cholesterol: 201 mg/dL — AB
HDL: 71 mg/dL — ABNORMAL HIGH (ref 40–60)
LDL Calculated: 112 mg/dL
Non HDL Cholesterol: 130 mg/dL
Triglycerides: 91 mg/dL

## 2021-05-16 LAB — HEMOGLOBIN A1C: Hemoglobin A1C: 5.6 %

## 2021-05-16 LAB — VITAMIN D: 25-OH Vit Total: 58 ng/mL (ref 30–60)

## 2021-05-16 LAB — VITAMIN B12: Vitamin B12: 406 pg/mL (ref 232–1245)

## 2021-06-12 ENCOUNTER — Telehealth: Payer: Self-pay | Admitting: Family Medicine

## 2021-06-12 DIAGNOSIS — Z1239 Encounter for other screening for malignant neoplasm of breast: Secondary | ICD-10-CM

## 2021-06-12 NOTE — Telephone Encounter (Signed)
Patient is looking for a referral for Bilateral breast U/S , diagnostic mammogram, she said she does that every year , she called the [facility] place and they needed a referral for that.Please advise

## 2021-06-14 ENCOUNTER — Telehealth: Payer: Self-pay | Admitting: Family Medicine

## 2021-06-14 DIAGNOSIS — Z1239 Encounter for other screening for malignant neoplasm of breast: Secondary | ICD-10-CM

## 2021-06-14 NOTE — Telephone Encounter (Signed)
For breast cancer screening, we do not order "diagnostic" mammograms. I placed an order today for screening mammogram and ultrasound. Diagnostic studies are meant for symptomatic concerns (ie lump, change in breast appearance, etc). These studies typically incur a more significant charge to the patient whereas screening tests are covered. Nurse - can you call patient to let her know about the order request? The imaging is still the same. Thanks!

## 2021-06-14 NOTE — Telephone Encounter (Signed)
Patient called to inform that [facility] called and asked her questions and they terminated  that  Dr. Margaretmary Lombard needs to send in a new referral for mammogram with a diagnostics due to her previous issues .

## 2021-06-14 NOTE — Telephone Encounter (Signed)
Called pt and relayed message. Pt v/u and appreciation, she will call to get this scheduled.

## 2021-06-15 ENCOUNTER — Other Ambulatory Visit: Payer: Self-pay | Admitting: Family Medicine

## 2021-06-15 DIAGNOSIS — N644 Mastodynia: Secondary | ICD-10-CM

## 2021-06-16 ENCOUNTER — Other Ambulatory Visit: Payer: Self-pay | Admitting: Family Medicine

## 2021-06-16 ENCOUNTER — Other Ambulatory Visit: Payer: Self-pay

## 2021-06-16 ENCOUNTER — Ambulatory Visit
Admission: RE | Admit: 2021-06-16 | Discharge: 2021-06-16 | Disposition: A | Discharge status: Home / Self Care | Payer: Medicare (Managed Care) | Source: Ambulatory Visit | Attending: Family Medicine | Admitting: Family Medicine

## 2021-06-16 ENCOUNTER — Ambulatory Visit
Admission: RE | Admit: 2021-06-16 | Discharge: 2021-06-16 | Disposition: A | Discharge status: Home / Self Care | Payer: Medicare (Managed Care) | Source: Ambulatory Visit

## 2021-06-16 DIAGNOSIS — Z803 Family history of malignant neoplasm of breast: Secondary | ICD-10-CM | POA: Insufficient documentation

## 2021-06-16 DIAGNOSIS — N644 Mastodynia: Secondary | ICD-10-CM | POA: Insufficient documentation

## 2021-06-16 DIAGNOSIS — Z1239 Encounter for other screening for malignant neoplasm of breast: Secondary | ICD-10-CM | POA: Insufficient documentation

## 2021-06-16 DIAGNOSIS — N6001 Solitary cyst of right breast: Secondary | ICD-10-CM | POA: Insufficient documentation

## 2021-07-05 ENCOUNTER — Other Ambulatory Visit: Payer: Medicare (Managed Care)

## 2021-08-01 ENCOUNTER — Other Ambulatory Visit: Payer: Self-pay | Admitting: Family Medicine

## 2021-08-01 DIAGNOSIS — I10 Essential (primary) hypertension: Secondary | ICD-10-CM

## 2021-08-01 DIAGNOSIS — E7800 Pure hypercholesterolemia, unspecified: Secondary | ICD-10-CM

## 2021-08-01 MED ORDER — AMLODIPINE BESYLATE 5 MG PO TABS *I*
5.0000 mg | ORAL_TABLET | Freq: Every day | ORAL | 1 refills | Status: DC
Start: 2021-08-01 — End: 2021-11-07

## 2021-08-01 MED ORDER — SIMVASTATIN 20 MG PO TABS *I*
20.0000 mg | ORAL_TABLET | Freq: Every day | ORAL | 3 refills | Status: DC
Start: 2021-08-01 — End: 2022-07-20

## 2021-08-01 NOTE — Telephone Encounter (Signed)
Recent Visits  Date Type Provider Dept   05/03/21 Office Visit Schuppert, Alison, MD [facility]   Showing recent visits within past 185 days in an active department and meeting all other requirements  Future Appointments  No visits were found meeting these conditions.  Showing future appointments within next 0 days in an active department and meeting all other requirements    Future Encounters      11/07/2021  9:30 AM Sch    FOLLOW UP VISIT    MAP    Schuppert, Alison, MD              LABORATORY & DATA:  Renal Func  Lab Results   Component Value Date/Time    K 4.5 05/16/2021 08:49 AM    CREAT 1.05 (H) 05/16/2021 08:49 AM    RFGRC 53 (!) 05/16/2021 08:49 AM    CA 10.0 05/16/2021 08:49 AM    PO4 3.6 11/14/2016 09:44 PM    TFT  Lab Results   Component Value Date/Time    TSH 2.34 04/25/2020 08:44 AM      Lipids  Lab Results   Component Value Date/Time    CHOL 201 (!) 05/16/2021 08:49 AM    HDL 71 (H) 05/16/2021 08:49 AM    LDLC 112 05/16/2021 08:49 AM    TRIG 91 05/16/2021 08:49 AM    A1c  Lab Results   Component Value Date/Time    HA1C 5.6 05/16/2021 08:49 AM    HA1C 5.7 (H) 04/25/2020 08:44 AM    HA1C 5.7 (H) 09/21/2019 09:29 AM      LFTs  Lab Results   Component Value Date/Time    AST 27 05/16/2021 08:49 AM    ALT 20 05/16/2021 08:49 AM    HTBIL 0.2 06/18/2013 12:00 AM    PT/INR  Lab Results   Component Value Date/Time    INR 1.0 11/05/2016 02:42 PM    PTI 11.7 11/05/2016 02:42 PM      WBC  Lab Results   Component Value Date/Time    WBC 5.3 05/16/2021 08:49 AM    HGB 13.5 05/16/2021 08:49 AM    HCT 42 05/16/2021 08:49 AM    PLT 328 05/16/2021 08:49 AM    Inflammatory Markers  Lab Results   Component Value Date/Time    ESR 9 09/21/2019 09:29 AM    CRP 4 10/09/2017 10:50 AM      UCDS: No results found for: [id number]

## 2021-08-01 NOTE — Telephone Encounter (Signed)
Recent Visits  Date Type Provider Dept   05/03/21 Office Visit Schuppert, Jill Side, MD [facility]   Showing recent visits within past 185 days in an active department and meeting all other requirements  Future Appointments  No visits were found meeting these conditions.  Showing future appointments within next 0 days in an active department and meeting all other requirements    Future Encounters      11/07/2021  9:30 AM Sch    FOLLOW UP VISIT    MAP    Diane Killings, MD              LABORATORY & DATA:  Renal Func  Lab Results   Component Value Date/Time    K 4.5 05/16/2021 08:49 AM    CREAT 1.05 (H) 05/16/2021 08:49 AM    RFGRC 53 (!) 05/16/2021 08:49 AM    CA 10.0 05/16/2021 08:49 AM    PO4 3.6 11/14/2016 09:44 PM    TFT  Lab Results   Component Value Date/Time    TSH 2.34 04/25/2020 08:44 AM      Lipids  Lab Results   Component Value Date/Time    CHOL 201 (!) 05/16/2021 08:49 AM    HDL 71 (H) 05/16/2021 08:49 AM    LDLC 112 05/16/2021 08:49 AM    TRIG 91 05/16/2021 08:49 AM    A1c  Lab Results   Component Value Date/Time    HA1C 5.6 05/16/2021 08:49 AM    HA1C 5.7 (H) 04/25/2020 08:44 AM    HA1C 5.7 (H) 09/21/2019 09:29 AM      LFTs  Lab Results   Component Value Date/Time    AST 27 05/16/2021 08:49 AM    ALT 20 05/16/2021 08:49 AM    HTBIL 0.2 06/18/2013 12:00 AM    PT/INR  Lab Results   Component Value Date/Time    INR 1.0 11/05/2016 02:42 PM    PTI 11.7 11/05/2016 02:42 PM      WBC  Lab Results   Component Value Date/Time    WBC 5.3 05/16/2021 08:49 AM    HGB 13.5 05/16/2021 08:49 AM    HCT 42 05/16/2021 08:49 AM    PLT 328 05/16/2021 08:49 AM    Inflammatory Markers  Lab Results   Component Value Date/Time    ESR 9 09/21/2019 09:29 AM    CRP 4 10/09/2017 10:50 AM      UCDS: No results found for: [id number]

## 2021-08-07 ENCOUNTER — Encounter: Payer: Self-pay | Admitting: Family Medicine

## 2021-08-15 ENCOUNTER — Ambulatory Visit: Payer: Medicare (Managed Care) | Admitting: Primary Care

## 2021-08-16 ENCOUNTER — Ambulatory Visit
Admission: RE | Admit: 2021-08-16 | Discharge: 2021-08-16 | Disposition: A | Discharge status: Home / Self Care | Payer: Medicare (Managed Care) | Source: Ambulatory Visit | Attending: Family Medicine | Admitting: Family Medicine

## 2021-08-16 ENCOUNTER — Ambulatory Visit: Payer: Medicare (Managed Care) | Admitting: Family Medicine

## 2021-08-16 ENCOUNTER — Other Ambulatory Visit: Payer: Self-pay

## 2021-08-16 ENCOUNTER — Encounter: Payer: Self-pay | Admitting: Family Medicine

## 2021-08-16 VITALS — BP 140/80 | HR 70 | Ht 63.0 in | Wt 143.6 lb

## 2021-08-16 DIAGNOSIS — K5900 Constipation, unspecified: Secondary | ICD-10-CM

## 2021-08-16 DIAGNOSIS — R140 Abdominal distension (gaseous): Secondary | ICD-10-CM | POA: Insufficient documentation

## 2021-08-16 DIAGNOSIS — K219 Gastro-esophageal reflux disease without esophagitis: Secondary | ICD-10-CM

## 2021-08-16 MED ORDER — FAMOTIDINE 20 MG PO TABS *I*
20.0000 mg | ORAL_TABLET | Freq: Two times a day (BID) | ORAL | 5 refills | Status: DC | PRN
Start: 2021-08-16 — End: 2021-11-17

## 2021-08-16 NOTE — Progress Notes (Signed)
[facility]  Outpatient Progress Note     Date:  08/16/2021  Name:  [name]   MRN:  [id number]  DOB:  [birth year]   Assessment and Plan     [name] is a 81 y.o. female presenting for follow up.    1. Constipation  2. Bloating  Suspect her bloating symptoms are related to incompletely treated constipation, stool burden.   Obtain KUB to assess. Treatment for constipation pending result.   Recommend tracking dietary fiber for 1 week. Aim for 25-30 g/day.   Recommend increasing fluid intake to 1.5-2 L daily.   Consider probiotic (align or culturelle) or increasing intake of natural probiotics (yogurt, other fermented foods)  Labs done in May showed normal CBC, CMP  If symptoms not improving despite treatment of constipation and good fiber, fluid intake recommend abdominal US as next step   Advised to call office with any concern for changing, worsening symptoms   - Abdomen AP and oblique; Future    3. GERD (gastroesophageal reflux disease)  Reflux well controlled off PPI therapy  I did not recommend restarting omeprazole today as I suspect her symptoms are currently unrelated   Can try pepcid prn for break through symptoms   - famotidine (PEPCID) 20 mg tablet; Take 1 tablet (20 mg total) by mouth 2 times daily as needed for Heartburn  Dispense: 60 tablet; Refill: 5        F/U: Follow up in about 4 weeks (around 09/13/2021) for Reassessment.     I personally spent 32 minutes on the calendar day of the encounter, including pre and post visit work.      SUBJECTIVE     Chief Complaint   Patient presents with   ? Other     Bloating and gas       Symptoms of bloating started in April-May this year  Initially mild, would notice more flatulence when out ie grocery shopping     Over time symptoms have worsened  Has not left house in 2 weeks due to this issue     +constipation - was taking miralax which helps produce bowel movement   Last BM today. BMs ranging from type 1 to type 6 on bristol stool  chart.   Bloating  Feeling stomach gurgle     Denies nausea, vomiting, worsened reflux, early satiety, unexpected weight loss.     Of note, she did stop omeprazole months ago (before symptoms began) and it did not seem to be a trigger for current symptoms.     Other therapies tried:   Probiotic with vinegar gummies (using x 2 mo) - notes mild improvement  Just picked up peppermint oil supplement she will try     Reports that GI issues have been life long  Constipation has been a problem on/off since childhood but usually able to resolve with standard measures     Diet -   Breakfast is typically protein bar + coffee  Lunch is salad or a half wrap with olives, cottage cheese  Dinner is salad if not for lunch or chicken with some type of vegetable side (green beans, roasted vegetables)  Sometimes pasta or mac salad   No significant snacking in evening     Fluid intake - reports 2-3 bottles/day (1-1.5 L)    ROS   Per above    I have reviewed the patient's past medical, surgical, family and medication histories and made appropriate corrections and updates in  their respective parts of this chart.     Objective     Vitals:    08/16/21 1104   BP: 140/80   Pulse: 70   Weight: 65.1 kg (143 lb 9.6 oz)   Height: 1.6 m (5\' 3" )    Wt Readings from Last 3 Encounters:   08/16/21 65.1 kg (143 lb 9.6 oz)   05/03/21 65.8 kg (145 lb)   01/17/21 65.5 kg (144 lb 6.4 oz)    BP Readings from Last 3 Encounters:   08/16/21 140/80   05/03/21 124/78   01/17/21 136/84        Physical Exam  General appearance: Well appearing, in NAD.  Cardiovascular:  Warm and well perfused.  Pulmonary:  Non-labored breathing.  Abd: NABS. Soft, non tender, mildly distended, no rebound or guarding.   Skin: Warm and dry.  Neuro:  Alert and oriented. CN II-XII grossly intact.    Labs:  Lab Results   Component Value Date    NA 139 05/16/2021    K 4.5 05/16/2021    CL 100 05/16/2021    CO2 26 05/16/2021    UN 15 05/16/2021    CREAT 1.05 (H) 05/16/2021    VID25 58  05/16/2021    VB12 406 05/16/2021    WBC 5.3 05/16/2021    HGB 13.5 05/16/2021    HCT 42 05/16/2021    PLT 328 05/16/2021    TSH 2.34 04/25/2020    HA1C 5.6 05/16/2021    CHOL 201 (!) 05/16/2021    TRIG 91 05/16/2021    HDL 71 (H) 05/16/2021    LDLC 112 05/16/2021    CHHDC 2.8 05/16/2021       [name], MD  Family Medicine  08/16/21  2:01 PM

## 2021-08-16 NOTE — Patient Instructions (Addendum)
For 1 week, track daily fiber intake   Goal is 25-30 g of fiber daily   If not getting in diet, ok to supplement with fiber one bars, fiber pills, metamucil, etc     Aim for 2 L of water daily   This would be 4 [employer] bottles of water (each is 500 mL)  Start with 3 and work your way up     Get x ray done of abdomen to assess stool burden     Probiotic options:   Align   Culturelle     Yogurt is a good source of healthy gut bacteria  Other fermented foods like saurkraut are good as well     Miralax is great for constipaiton     For break through reflux symptoms I recommend pepcid 20 mg

## 2021-08-17 ENCOUNTER — Encounter: Payer: Self-pay | Admitting: Family Medicine

## 2021-08-17 ENCOUNTER — Telehealth: Payer: Self-pay

## 2021-08-17 DIAGNOSIS — K5900 Constipation, unspecified: Secondary | ICD-10-CM

## 2021-08-17 MED ORDER — SENNOSIDES 8.6 MG PO TABS *I*
2.0000 | ORAL_TABLET | Freq: Every day | ORAL | 2 refills | Status: DC
Start: 2021-08-17 — End: 2023-10-08

## 2021-08-17 MED ORDER — BISACODYL 10 MG RE SUPP *I*
10.0000 mg | Freq: Every day | RECTAL | 0 refills | Status: AC | PRN
Start: 2021-08-17 — End: ?

## 2021-08-17 NOTE — Telephone Encounter (Signed)
Called pt and relayed message from Dr. Schuppert. Pt v/u and appreciation.

## 2021-08-17 NOTE — Telephone Encounter (Signed)
-----   Message from [name], MD sent at 08/17/2021 10:23 AM EDT -----  Large stool burden   Recommend following management of constipation:   - miralax twice daily   - senna   - dulcolax suppository if symptoms not resolving, prn   I will send senna and dulcolax to pharmacy, patient can get miralax OTC at pharmacy. Nurse - can you call to let her know about result and plan for constipation?

## 2021-08-21 ENCOUNTER — Telehealth: Payer: Self-pay | Admitting: Family Medicine

## 2021-08-21 NOTE — Telephone Encounter (Signed)
Patient called this morning to let you know that she is feeling so much better after she took the med for the bowel movement and she wanted to say thank you.  Just an [place]

## 2021-08-30 ENCOUNTER — Encounter: Payer: Self-pay | Admitting: Family Medicine

## 2021-08-30 NOTE — Telephone Encounter (Signed)
Copied from telephone encounter 08/17/21:    ----- Message from [name], MD sent at 08/17/2021 10:23 AM EDT -----  Large stool burden   Recommend following management of constipation:   - miralax twice daily   - senna   - dulcolax suppository if symptoms not resolving, prn

## 2021-09-14 ENCOUNTER — Other Ambulatory Visit: Payer: Self-pay

## 2021-09-14 ENCOUNTER — Encounter: Payer: Self-pay | Admitting: Family Medicine

## 2021-09-14 ENCOUNTER — Ambulatory Visit: Payer: Medicare (Managed Care) | Admitting: Family Medicine

## 2021-09-14 VITALS — BP 138/68 | HR 62 | Ht 63.0 in | Wt 143.9 lb

## 2021-09-14 DIAGNOSIS — I10 Essential (primary) hypertension: Secondary | ICD-10-CM

## 2021-09-14 DIAGNOSIS — K5900 Constipation, unspecified: Secondary | ICD-10-CM

## 2021-09-14 DIAGNOSIS — Z7185 Encounter for immunization safety counseling: Secondary | ICD-10-CM

## 2021-09-14 NOTE — Progress Notes (Signed)
[facility]  Outpatient Progress Note     Date:  09/14/2021  Name:  [name]   MRN:  [id number]  DOB:  1940/01/30   Assessment and Plan     [name] is a 81 y.o. female presenting for follow up.    1. Essential hypertension  BP is at goal of <140/90. Potassium was last checked on 05/16/2021 and was 4.15mmol/L, which is within acceptable limits. Creatinine was last checked on 05/16/2021 and was 1.05mg /dL, which is within acceptable limits.   If urinary concerns persist despite titration of fluid intake, consider stopping HCTZ and tapering up amlodipine to desired BP effect.   Encouraged to check BP out of office and call if elevated above goal.   Medication regimen as of TODAY is as follows:   Antihypertensive Medications               amLODIPine (NORVASC) 5 mg tablet Take 1 tablet (5 mg total) by mouth daily    hydroCHLOROthiazide (HYDRODIURIL) 25 mg tablet TAKE 1/2 TABLET BY MOUTH EVERY MORNING          2. Constipation  Symptoms significantly improved with stool softeners, increased fiber and fluid intake.   Ok to reduce fluid intake to 3-3.5 bottles of water per day. See above for antiHTN considerations for this concern.     3. Immunization counseling  Recommend flu shot and updated COVID booster when available this fall.         F/U: 11/07/2021 next scheduled appt      SUBJECTIVE     Chief Complaint   Patient presents with    Follow-up    Hypertension    Other     Bloating\gas       Past last seen in August for bloating, gas concern. She has since increased fiber and water intake. She reports her bowel movements are significantly improved.     Constipation -   Continues to have gas, estimates about 99% better   Having a bowel movement every day with supplements (senna + miralax)   Now just using miralax   Stools are well formed, no straining     Doing better with fiber intake   Now up to 25 g daily   4 bottles of water (500 mL each) per day  Having some issues with urinary urgency and/or  incontinence by the end of day with the increase in fluids     HTN -  Has not checked BP at home in some time  No concerns  Taking medication as prescribed   Antihypertensive Medications               amLODIPine (NORVASC) 5 mg tablet Take 1 tablet (5 mg total) by mouth daily    hydroCHLOROthiazide (HYDRODIURIL) 25 mg tablet TAKE 1/2 TABLET BY MOUTH EVERY MORNING            ROS  Per above    I have reviewed the patient's past medical, surgical, family and medication histories and made appropriate corrections and updates in their respective parts of this chart.     Objective     Vitals:    09/14/21 1330 09/14/21 1355   BP: 140/80 138/68   Pulse: 62    SpO2: 98%    Weight: 65.3 kg (143 lb 14.4 oz)    Height: 1.6 m (5\' 3" )      Physical Exam  General appearance: Well appearing, in NAD.  Cardiovascular:  Warm  and well perfused.  Pulmonary:  Non-labored breathing.  Skin: Warm and dry.  Neuro:  Alert and oriented. CN II-XII grossly intact.    Labs:  Lab Results   Component Value Date    NA 139 05/16/2021    K 4.5 05/16/2021    CL 100 05/16/2021    CO2 26 05/16/2021    UN 15 05/16/2021    CREAT 1.05 (H) 05/16/2021    VID25 58 05/16/2021    VB12 406 05/16/2021    WBC 5.3 05/16/2021    HGB 13.5 05/16/2021    HCT 42 05/16/2021    PLT 328 05/16/2021    TSH 2.34 04/25/2020    HA1C 5.6 05/16/2021    CHOL 201 (!) 05/16/2021    TRIG 91 05/16/2021    HDL 71 (H) 05/16/2021    LDLC 112 05/16/2021    CHHDC 2.8 05/16/2021       [name], MD  Family Medicine  09/14/21  2:05 PM

## 2021-10-04 ENCOUNTER — Encounter: Payer: Self-pay | Admitting: Family Medicine

## 2021-10-06 ENCOUNTER — Telehealth: Payer: Self-pay | Admitting: Family Medicine

## 2021-10-06 DIAGNOSIS — I10 Essential (primary) hypertension: Secondary | ICD-10-CM

## 2021-10-06 NOTE — Telephone Encounter (Signed)
Patient spoke with pharmacy and medication will be delivered on Monday  We do not need to send anything for her to a local pharmacy    FYI

## 2021-10-06 NOTE — Telephone Encounter (Signed)
Patient called to inform that her medication hydroCHLOROthiazide shows that it has been delivered from  [facility] [address] [place], [place] - [address]   [address], [place] [address] .    Patient said that she has not received no medication at all .Does not know what to do

## 2021-10-13 NOTE — Telephone Encounter (Signed)
Spoke with patient and she states every time that she tries to have a bowel movement she feels like the polyp is trying to come down. She's concerned that this could be why she feels like she can't empty herself completely. States she has been having soft stools. Patient would like to take this medication that she purchased called My gentle detox its a pill and she  wanted to know your thoughts. At the end of the conversation patient stated that she's going to try both medications and call on monday to see if she can get an sooner appointment then November.       Patient was made aware of providers directions and verbalized understanding.

## 2021-10-18 ENCOUNTER — Telehealth: Payer: Self-pay | Admitting: Primary Care

## 2021-10-19 NOTE — Telephone Encounter (Signed)
[facility]    AFTER HOURS / PRACTICE CALL NOTE    Date and Time of Call:  10/18/21 7 pm     PCP:  [name], MD     Problem:   BRBPR.  Patient calling concerned because she experienced some painless bright red blood per rectum following a bowel movement earlier.  Blood was on the toilet paper not in the toilet bowl.  She has been treated for constipation/IBS for the past month by her PCP.  She has had 3 bowel movements today.  The bowel movement with the blood she felt something which she called a polyp come out of the rectum which was easily reduced.  She denies abdominal pain, fever, lightheadedness.    Impression and Plan: By history it sounds like bleeding internal hemorrhoid.  Reassurance offered.  Would be concerned if she had blood per rectum outside of a bowel movement or frequent bowel movements with large amount of blood in the toilet bowl.  She will follow-up with her PCP.        The patient was instructed to call back if patient is having worsening symptoms or lack of improvement of current symptoms with the above treatment, and patient expressed understanding.    Lyman Speller Diane Sibert, DO

## 2021-11-07 ENCOUNTER — Other Ambulatory Visit: Payer: Self-pay

## 2021-11-07 ENCOUNTER — Encounter: Payer: Self-pay | Admitting: Family Medicine

## 2021-11-07 ENCOUNTER — Ambulatory Visit: Payer: Medicare (Managed Care) | Admitting: Family Medicine

## 2021-11-07 VITALS — BP 130/74 | HR 72 | Ht 63.0 in | Wt 146.0 lb

## 2021-11-07 DIAGNOSIS — K649 Unspecified hemorrhoids: Secondary | ICD-10-CM

## 2021-11-07 DIAGNOSIS — I10 Essential (primary) hypertension: Secondary | ICD-10-CM

## 2021-11-07 DIAGNOSIS — N3946 Mixed incontinence: Secondary | ICD-10-CM

## 2021-11-07 DIAGNOSIS — K5900 Constipation, unspecified: Secondary | ICD-10-CM

## 2021-11-07 MED ORDER — AMLODIPINE BESYLATE 10 MG PO TABS *I*
10.0000 mg | ORAL_TABLET | Freq: Every day | ORAL | 1 refills | Status: DC
Start: 2021-11-07 — End: 2022-01-29

## 2021-11-07 NOTE — Telephone Encounter (Signed)
Please advise 

## 2021-11-07 NOTE — Progress Notes (Signed)
[facility]  Outpatient Progress Note     Date:  11/07/2021  Name:  [name]   MRN:  [id number]  DOB:  05-17-1940   Assessment and Plan     [name] is a 81 y.o. female presenting for follow up.    1. Essential hypertension  BP is at goal of <140/90.     Stop HCTZ. Increase amlodipine.   Medication regimen as of TODAY is as follows:   Antihypertensive Medications               amLODIPine (NORVASC) 10 mg tablet Take 1 tablet (10 mg total) by mouth daily          See below for medication adjustment rationale   - amLODIPine (NORVASC) 10 mg tablet; Take 1 tablet (10 mg total) by mouth daily  Dispense: 90 tablet; Refill: 1    2. Mixed stress and urge urinary incontinence  Discussed possibility that HCTZ is contributing to urinary incontinence issues  She is interested in getting off this medication and working to manage BP with other efforts  Increase amlodipine per above   Offered pelvic floor PT as next step, she declined today     3. Constipation  4. Hemorrhoid  Constipation issues generally improved however patient having issues with fecal incontinence and sensation of incomplete BM. Known hemorrhoid hx which may also be contributing.   Continue good fiber and water intake. Aim for 30 g fiber daily and 2 L water.   Encouraged regular activity such as walking to stimulate gut motility.   Refer to colorectal for further evaluation  - AMB REFERRAL TO [facility]        F/U: Follow up in about 5 weeks (around 12/12/2021) for Follow-up HTN.     SUBJECTIVE     Chief Complaint   Patient presents with    Follow-up    Hypertension       Constipation -   Symptoms have generally been better   Having a daily BM now but feels like she is incompletely emptying   Medications: miralax daily. Senna prn, estimates using 4x since last visit.   Bloating is much better but still gets at times   Drinking 3 bottles of water per day, cannot tolerate more due to urinary leakage  Fiber  is good - protein wrap has 15 g of fiber, one banana daily, salad every 3-4 days, brussel sprouts rarely, broccoli 2x/week. She thinks she is getting close to 30 g of fiber daily.   Patient reports she is not moving around much  Having some fecal incontinence as well   Has a hemorrhoid, using preparation H     Stress -   Daughter in law is having brain surgery next week     HTN -  BP at home last week were great 128/80, 130/80, generally around that range   When upset or nevous BP will go up   No readings over 140 systolic outside of being upset       ROS  Per above    I have reviewed the patient's past medical, surgical, family and medication histories and made appropriate corrections and updates in their respective parts of this chart.     Objective     Vitals:    11/07/21 0935 11/07/21 0949 11/07/21 1013   BP: 156/78 145/77 130/74   Pulse: 72     SpO2: 99%  Weight: 66.2 kg (146 lb)     Height: 1.6 m (5\' 3" )       Physical Exam  General appearance: Well appearing, in NAD.  Cardiovascular:  Warm and well perfused.  Pulmonary:  Non-labored breathing.  Skin: Warm and dry.  Neuro:  Alert and oriented. Responding appropriately in conversation.     Labs:  Lab Results   Component Value Date    NA 139 05/16/2021    K 4.5 05/16/2021    CL 100 05/16/2021    CO2 26 05/16/2021    UN 15 05/16/2021    CREAT 1.05 (H) 05/16/2021    VID25 58 05/16/2021    VB12 406 05/16/2021    WBC 5.3 05/16/2021    HGB 13.5 05/16/2021    HCT 42 05/16/2021    PLT 328 05/16/2021    TSH 2.34 04/25/2020    HA1C 5.6 05/16/2021    CHOL 201 (!) 05/16/2021    TRIG 91 05/16/2021    HDL 71 (H) 05/16/2021    LDLC 112 05/16/2021    CHHDC 2.8 05/16/2021       [name], MD  Family Medicine  11/07/21  10:57 AM

## 2021-11-07 NOTE — Patient Instructions (Addendum)
Fiber, water, walking - essential for constipation     You were referred to colorectal today for your constipation, hemorrhoid today    Increase amlodipine to 10 mg daily   I sent a new script to your mail order pharmacy today     STOP hydrochlorothiazide     See if this helps your urinary concerns   If any issues arise, please call our office

## 2021-11-17 ENCOUNTER — Other Ambulatory Visit: Payer: Self-pay | Admitting: Family Medicine

## 2021-11-17 DIAGNOSIS — K219 Gastro-esophageal reflux disease without esophagitis: Secondary | ICD-10-CM

## 2021-11-17 NOTE — Telephone Encounter (Signed)
Recent Visits  Date Type Provider Dept   11/07/21 Office Visit [name], MD [facility]   09/14/21 Office Visit Schuppert, Jill Side, MD [facility]   08/16/21 Office Visit Schuppert, Jill Side, MD [facility]   Showing recent visits within past 185 days in an active department and meeting all other requirements  Future Appointments  No visits were found meeting these conditions.  Showing future appointments within next 0 days in an active department and meeting all other requirements    Future Encounters      12/12/2021  9:30 AM [facility]    FOLLOW UP VISIT    MAP    [name], MD              LABORATORY & DATA:  Renal Func  Lab Results   Component Value Date/Time    K 4.5 05/16/2021 08:49 AM    CREAT 1.05 (H) 05/16/2021 08:49 AM    RFGRC 53 (!) 05/16/2021 08:49 AM    CA 10.0 05/16/2021 08:49 AM    PO4 3.6 11/14/2016 09:44 PM    TFT  Lab Results   Component Value Date/Time    TSH 2.34 04/25/2020 08:44 AM      Lipids  Lab Results   Component Value Date/Time    CHOL 201 (!) 05/16/2021 08:49 AM    HDL 71 (H) 05/16/2021 08:49 AM    LDLC 112 05/16/2021 08:49 AM    TRIG 91 05/16/2021 08:49 AM    A1c  Lab Results   Component Value Date/Time    HA1C 5.6 05/16/2021 08:49 AM    HA1C 5.7 (H) 04/25/2020 08:44 AM    HA1C 5.7 (H) 09/21/2019 09:29 AM      LFTs  Lab Results   Component Value Date/Time    AST 27 05/16/2021 08:49 AM    ALT 20 05/16/2021 08:49 AM    HTBIL 0.2 06/18/2013 12:00 AM    PT/INR  Lab Results   Component Value Date/Time    INR 1.0 11/05/2016 02:42 PM    PTI 11.7 11/05/2016 02:42 PM      WBC  Lab Results   Component Value Date/Time    WBC 5.3 05/16/2021 08:49 AM    HGB 13.5 05/16/2021 08:49 AM    HCT 42 05/16/2021 08:49 AM    PLT 328 05/16/2021 08:49 AM    Inflammatory Markers  Lab Results   Component Value Date/Time    ESR 9 09/21/2019 09:29 AM    CRP 4 10/09/2017 10:50 AM      UCDS: No results found for: "DRUG10UR"

## 2021-11-18 MED ORDER — FAMOTIDINE 20 MG PO TABS *I*
20.0000 mg | ORAL_TABLET | Freq: Two times a day (BID) | ORAL | 5 refills | Status: DC | PRN
Start: 2021-11-18 — End: 2022-04-18

## 2021-11-28 ENCOUNTER — Encounter: Payer: Self-pay | Admitting: Family Medicine

## 2021-12-12 ENCOUNTER — Encounter: Payer: Self-pay | Admitting: Family Medicine

## 2021-12-12 ENCOUNTER — Ambulatory Visit: Payer: Medicare (Managed Care) | Admitting: Family Medicine

## 2021-12-12 ENCOUNTER — Other Ambulatory Visit: Payer: Self-pay

## 2021-12-12 VITALS — BP 116/70 | HR 67 | Ht 63.0 in | Wt 146.6 lb

## 2021-12-12 DIAGNOSIS — I10 Essential (primary) hypertension: Secondary | ICD-10-CM

## 2021-12-12 DIAGNOSIS — K5900 Constipation, unspecified: Secondary | ICD-10-CM

## 2021-12-12 DIAGNOSIS — N3946 Mixed incontinence: Secondary | ICD-10-CM

## 2021-12-12 NOTE — Patient Instructions (Signed)
Follow up with Dr. Teresa Pelton   Let me know if you need a new referral     Let me know if you are interested in pelvic floor PT or a urology referral at any point

## 2021-12-12 NOTE — Progress Notes (Signed)
[facility]  Outpatient Progress Note     Date:  12/12/2021  Name:  [name]   MRN:  [id number]  DOB:  12/18/1940   Assessment and Plan     [name] is a 81 y.o. female presenting for follow up.    1. Essential hypertension  BP is at goal of <140/90.     Continue amlodipine 10 mg daily. Call if BP elevated above goal or with any concerns prior to next appointment.   Medication regimen as of TODAY is as follows:   Antihypertensive Medications               amLODIPine (NORVASC) 10 mg tablet (Taking) Take 1 tablet (10 mg total) by mouth daily          2. Mixed stress and urge urinary incontinence  Symptoms improved however not resolved with stopping HCTZ and changing to amlodipine above  Offered referral to pelvic floor PT vs urology   She declines at this time     3. Constipation  Symptoms improving with good fiber and water intake   Recommend stopping titanium dioxide containing gas relief supplement   She will call Dr. Lenox Ahr office to schedule follow up        F/U: Follow up in about 6 months (around 06/13/2022) for SWV.     SUBJECTIVE     Chief Complaint   Patient presents with    Follow-up    Hypertension       Feeling better  Having more normal bowel movements, 1-2 times per day   Doing well with fiber and water intake   Continues to have some gas, wondering if it is due to bean and tomato consumption  Occasional mucus in stool   She has reached out to GI doctor to schedule appointment as well - Dr. Teresa Pelton    HTN -   BP at home has been around 130/70-80s   Off HCTZ and on amlodipine now   Does not seem to have significantly helped bladder symptoms   Helped a little (getting up once per night instead of 3-4 times)      ROS  Per above     I have reviewed the patient's past medical, surgical, family and medication histories and made appropriate corrections and updates in their respective parts of this chart.     Objective     Vitals:    12/12/21 0930   BP: 116/70   Pulse: 67   Weight:  66.5 kg (146 lb 9.6 oz)   Height: 1.6 m (5\' 3" )    Wt Readings from Last 3 Encounters:   12/12/21 66.5 kg (146 lb 9.6 oz)   11/07/21 66.2 kg (146 lb)   09/14/21 65.3 kg (143 lb 14.4 oz)    BP Readings from Last 3 Encounters:   12/12/21 116/70   11/07/21 130/74   09/14/21 138/68        Physical Exam  General appearance: Well appearing, in NAD.  Cardiovascular:  Warm and well perfused.  Pulmonary:  Non-labored breathing.  Skin: Warm and dry.  Neuro:  Alert and oriented.     Labs:  Lab Results   Component Value Date    NA 139 05/16/2021    K 4.5 05/16/2021    CL 100 05/16/2021    CO2 26 05/16/2021    UN 15 05/16/2021    CREAT 1.05 (H) 05/16/2021    VID25 58 05/16/2021    VB12  406 05/16/2021    WBC 5.3 05/16/2021    HGB 13.5 05/16/2021    HCT 42 05/16/2021    PLT 328 05/16/2021    TSH 2.34 04/25/2020    HA1C 5.6 05/16/2021    CHOL 201 (!) 05/16/2021    TRIG 91 05/16/2021    HDL 71 (H) 05/16/2021    LDLC 112 05/16/2021    CHHDC 2.8 05/16/2021       [name], MD  Family Medicine  12/12/21  9:54 AM

## 2021-12-14 ENCOUNTER — Telehealth: Payer: Self-pay | Admitting: Family Medicine

## 2021-12-14 NOTE — Telephone Encounter (Signed)
Patient notified

## 2021-12-15 ENCOUNTER — Ambulatory Visit: Payer: Medicare (Managed Care) | Admitting: Nurse Practitioner

## 2021-12-18 ENCOUNTER — Telehealth: Payer: Self-pay

## 2021-12-18 NOTE — Telephone Encounter (Signed)
Copied from CRM #[id number]. Topic: Appointments - Appointment Information  >> Dec 18, 2021  9:19 AM Alessandra Bevels wrote:  Patient called to check on status of referral to Dr. Teresa Pelton Writer advised referral has been received. Writer reached out to IBD back office  (per referral notes on 12/15 back office tried to call patient to schedule) and warm transferred patient over to schedule. Patient can be reached at [phone]

## 2022-01-05 ENCOUNTER — Telehealth: Payer: Self-pay

## 2022-01-05 ENCOUNTER — Ambulatory Visit: Payer: Medicare (Managed Care) | Admitting: Gastroenterology

## 2022-01-05 ENCOUNTER — Encounter: Payer: Self-pay | Admitting: Gastroenterology

## 2022-01-05 ENCOUNTER — Other Ambulatory Visit: Payer: Self-pay

## 2022-01-05 VITALS — BP 144/76 | HR 72 | Temp 97.7°F | Ht 63.0 in | Wt 143.6 lb

## 2022-01-05 DIAGNOSIS — R140 Abdominal distension (gaseous): Secondary | ICD-10-CM

## 2022-01-05 DIAGNOSIS — K5900 Constipation, unspecified: Secondary | ICD-10-CM

## 2022-01-05 NOTE — Telephone Encounter (Signed)
Spoke with [name] , patient, at check out:    Provider's recommendations were:     Follow-up disposition: Follow up in about 3 months (around 04/06/2022).  Check out comments: 3 mo Fridays    Lactose HBT and fructose/fructan HBT       Scheduled all recommended appts as directed in check out comment.    AVS provided to patient via GIVEN AT CHECKOUT.    Spoke with patient scheduled HBT on 03.11.24/ 03.12.24  at 8:30 at [facility] to the 5th floor location.      Patient was asked and confirmed the following questions:    1.  Has the patient been taking any antibiotics within the last 4 weeks prior to appointment date? (If on Erythromycin or Gastroporesis ok to continue on medication)? no    2.  Is the patient aware that 1 week prior to test not to take any laxatives or stool softeners? yes    3.  Is the patient aware not to undergo any test that requires cleansing of the bowel (such as colonoscopy or barium enema) one week before the test?  yes    4. Prep has been sent to patient via : [place] [facility]

## 2022-01-05 NOTE — Progress Notes (Incomplete)
[name]  [id number]     01/05/2022  [name], MD  [phone]  [address]  [place],  [place] [address]      Chief Complaint/Reason for Office Visit: ***    I had the pleasure of seeing your patient, [name], in the outpatient gastroenterology/hepatology clinic. As you know, she is a 82 y.o. female with a past medical history significant for *** who is referred to our office for ***.      Mag 250 mg for a month    Miral once daily    Fiber bars daily      Abdomail gas- more than normla in last 2 months    Trying a high fiber diet for last 6-8 weeks    Post prandial blating    5-6 montsh of symptoms  -     Passing gas, sometiems brown          Allergies/Sensitivities:  Allergies   Allergen Reactions    Fentanyl Nausea And Vomiting    Oxycodone Nausea And Vomiting     When in hospital for AAA repair    Environmental Allergies Other (See Comments)     Nasal congestion    Lipitor [Atorvastatin] Other (See Comments)     Elevated LFTs    Tramadol Other (See Comments)     "felt like a zombie"       Medications:   Prior to Admission medications    Medication Sig Start Date End Date Taking? Authorizing Provider   famotidine (PEPCID) 20 mg tablet Take 1 tablet (20 mg total) by mouth 2 times daily as needed for Heartburn 11/18/21 05/17/22  [name], MD   amLODIPine (NORVASC) 10 mg tablet Take 1 tablet (10 mg total) by mouth daily 11/07/21 05/06/22  [name], MD   senna (SENOKOT) 8.6 mg tablet Take 2 tablets by mouth daily  for Constipation 08/17/21   [name], MD   bisacodyl (DULCOLAX) 10 mg suppository Place 1 suppository (10 mg total) rectally daily as needed  for 2nd line constipation if not improving with miralax, senna 08/17/21   [name], MD   polyethylene glycol (GLYCOLAX,MIRALAX) 17 g powder packet Take by mouth daily    [provider]   simvastatin (ZOCOR) 20 mg tablet Take 1 tablet (20 mg total) by mouth daily (with dinner) 08/01/21   Schuppert, Jill Side,  MD   melatonin 3 mg Take 1 tablet (3 mg total) by mouth nightly as needed for Sleep    [provider]   acetaminophen (TYLENOL) 325 MG tablet Take by mouth every 6 hours as needed for Pain    [provider]   Calcium Carbonate-Vitamin D (CALCIUM 500 + D PO) Take 1 tablet by mouth 2 times daily    [provider]   aspirin 81 MG tablet Take 1 tablet (81 mg total) by mouth daily 01/26/15   [name], MD       Past Medical Hx:   Past Medical History:   Diagnosis Date    AAA (abdominal aortic aneurysm)     Chronic pain     Elevated LFTs     Unclear if this was related to statin, gallstones, alcohol consumption     Essential hypertension 06/11/2016    GERD (gastroesophageal reflux disease)     High blood pressure     HLD (hyperlipidemia)     Hx of colonic polyps     Colonscopy 08/11/10; repeat 2017  per GI     Lumbar disc herniation 2006    L5-S1    Osteoporosis     Spondylosis     Urinary retention        Past Surgical Hx:   Past Surgical History:   Procedure Laterality Date    cataract repair Bilateral     COLONOSCOPY  2011    HYSTERECTOMY      left ovaries in place     TUBAL LIGATION         Social Hx:   Social History     Tobacco Use    Smoking status: Former     Packs/day: 1.00     Years: 30.00     Additional pack years: 0.00     Total pack years: 30.00     Types: Cigarettes     Quit date: 06/15/2013     Years since quitting: 8.5    Smokeless tobacco: Former   Substance Use Topics    Alcohol use: Yes     Alcohol/week: 14.0 standard drinks of alcohol     Types: 14 Glasses of wine per week     Comment: 2/day       Family Hx: History reviewed. No pertinent family history of GI malignancy, IBD, liver disease, pancreatic disease, or celiac disease.    Vitals:   Vitals:    01/05/22 0910   BP: 144/76   Pulse: 72   Temp: 36.5 C (97.7 F)   Weight: 65.1 kg (143 lb 9.6 oz)   Height: 160 cm (5\' 3" )     Body mass index is 25.44 kg/m.    Physical Exam:   General: ***  Skin:  No rashes, jaundice.   Warm and dry.   HEENT:  No icterus.  No oropharyngeal abnormalities.    Neck:  No masses or tracheal deviation.  Supple.  Lungs:  Clear bilaterally to auscultation. Normal respiratory effort.    Cor:  RRR without murmur.    Abdomen:  Normal bowel sounds, no bruits.  Non-tender, non-distended. No hepatosplenomegaly appreciated.   Extremities:  Warm, no edema.   Neuro:  Alert and oriented x 3 with appropriate affect.  Ambulatory.  No gross motor deficits noted.      GI Procedures:  ***    Labs/Imaging:   ***    Impression(s)/Recommendation(s):   ***      Thank you for allowing [place] to participate in this patient's care.  Please do not hesitate to contact us with any questions or concerns at [phone].    Tyrone Nine, [place]

## 2022-01-09 ENCOUNTER — Encounter: Payer: Self-pay | Admitting: Gastroenterology

## 2022-01-26 ENCOUNTER — Encounter: Payer: Self-pay | Admitting: Family Medicine

## 2022-01-29 ENCOUNTER — Other Ambulatory Visit: Payer: Self-pay | Admitting: Family Medicine

## 2022-01-29 DIAGNOSIS — I10 Essential (primary) hypertension: Secondary | ICD-10-CM

## 2022-01-29 MED ORDER — AMLODIPINE BESYLATE 10 MG PO TABS *I*
10.0000 mg | ORAL_TABLET | Freq: Every day | ORAL | 1 refills | Status: DC
Start: 2022-01-29 — End: 2022-04-30

## 2022-01-29 NOTE — Telephone Encounter (Signed)
[facility] Refill Note  01/29/22  [name]  April 04, 1940  MRN: [id number]    Last office visit: 12/12/2021    Last telehome visit: Visit date not found    Recent Visits  Date Type Provider Dept   12/12/21 Office Visit [name], MD [facility]   11/07/21 Office Visit Schuppert, Jill Side, MD [facility]   09/14/21 Office Visit Schuppert, Jill Side, MD [facility]   08/16/21 Office Visit Schuppert, Jill Side, MD [facility]   Showing recent visits within past 185 days in an active department and meeting all other requirements  Future Appointments  No visits were found meeting these conditions.  Showing future appointments within next 0 days in an active department and meeting all other requirements      LABORATORY DATA:  Last Renal Func was  05/16/2021: Creatinine 1.05 mg/dL (H; Ref range: 5.40 - 9.81 mg/dL); eGFR BY CREAT 53 * (!; Ref range: *); Potassium 4.5 mmol/L (Ref range: 3.3 - 5.1 mmol/L).   Last LFTs was  06/18/2013: Bilirubin,Total 0.2 (Ref range: )  05/16/2021: ALT 20 U/L (Ref range: 0 - 35 U/L); AST 27 U/L (Ref range: 0 - 35 U/L).   Last Lipid panel was 05/16/2021: Cholesterol 201 mg/dL (!; Ref range: mg/dL); HDL 71 mg/dL (H; Ref range: 40 - 60 mg/dL); LDL Calculated 191 mg/dL (Ref range: mg/dL).  Last A1C was 05/16/2021: Hemoglobin A1C 5.6 % (Ref range: %).  Last TFT was 04/25/2020: TSH 2.34 uIU/mL (Ref range: 0.27 - 4.20 uIU/mL), No results found for requested labs within last 3650 days.[name]

## 2022-02-05 ENCOUNTER — Encounter: Payer: Self-pay | Admitting: Family Medicine

## 2022-02-06 ENCOUNTER — Encounter: Payer: Self-pay | Admitting: Family Medicine

## 2022-02-06 ENCOUNTER — Telehealth: Payer: Self-pay | Admitting: Gastroenterology

## 2022-02-06 ENCOUNTER — Other Ambulatory Visit: Payer: Self-pay

## 2022-02-06 DIAGNOSIS — I714 Abdominal aortic aneurysm, without rupture, unspecified: Secondary | ICD-10-CM

## 2022-02-06 NOTE — Telephone Encounter (Signed)
Copied from CRM #[id number]. Topic: Appointments - Cancel Appointment  >> Feb 06, 2022  8:36 AM [name] wrote:  [name], Patient,  is calling to cancel the patients  HBT appointment which is currently scheduled for 03/12/22 and 03/13/22 and FUV 04/20/22.    Reason for the cancellation: personal reason     Has the appointment been cancelled? yes    Has the appointment been rescheduled? no    Does the patient need a call back to reschedule?no, she will call back when she is ready for these appointments     Patient can be contacted back at [phone].

## 2022-02-12 ENCOUNTER — Ambulatory Visit
Admission: RE | Admit: 2022-02-12 | Discharge: 2022-02-12 | Disposition: A | Discharge status: Home / Self Care | Payer: Medicare (Managed Care) | Source: Ambulatory Visit | Attending: Family Medicine | Admitting: Family Medicine

## 2022-02-12 ENCOUNTER — Other Ambulatory Visit: Payer: Self-pay

## 2022-02-12 ENCOUNTER — Ambulatory Visit: Payer: Medicare (Managed Care) | Admitting: Family Medicine

## 2022-02-12 ENCOUNTER — Encounter: Payer: Self-pay | Admitting: Family Medicine

## 2022-02-12 VITALS — BP 130/70 | HR 67 | Ht 63.0 in | Wt 145.5 lb

## 2022-02-12 DIAGNOSIS — R600 Localized edema: Secondary | ICD-10-CM

## 2022-02-12 DIAGNOSIS — M85852 Other specified disorders of bone density and structure, left thigh: Secondary | ICD-10-CM

## 2022-02-12 DIAGNOSIS — M1612 Unilateral primary osteoarthritis, left hip: Secondary | ICD-10-CM

## 2022-02-12 DIAGNOSIS — R1032 Left lower quadrant pain: Secondary | ICD-10-CM

## 2022-02-12 DIAGNOSIS — I10 Essential (primary) hypertension: Secondary | ICD-10-CM

## 2022-02-12 MED ORDER — HYDROCHLOROTHIAZIDE 12.5 MG PO TABS *I*
12.5000 mg | ORAL_TABLET | Freq: Every day | ORAL | 1 refills | Status: DC
Start: 2022-02-12 — End: 2022-04-30

## 2022-02-12 NOTE — Progress Notes (Signed)
[facility]  Outpatient Progress Note     Date:  02/12/2022  Name:  [name]   MRN:  [id number]  DOB:  11/27/1940   Assessment and Plan     [name] is a 82 y.o. female presenting for follow up.    1. Lower extremity edema  2. Essential hypertension  Exam shows symmetric BL lower extremity edema today, with time frame c/w medication side effect. Just prior to swelling symptom onset her HCTZ was discontinued and amlodipine increased.   Recommend adding back low dose HCTZ to see if this will off set the swelling enough without leading to increased urinary frequency, incontinence.   She will reach out with an update on symptoms to determine next steps in management. If BP tolerates, can try a dose decrease on the amlodipine.     Repeat BMP in 2 weeks to reassess renal function with medication change.     Continue elevating legs when able, compression stockings     BP is at goal of <140/90. Potassium was last checked on 05/16/2021 and was 4.8mmol/L, which is within acceptable limits. Creatinine was last checked on 05/16/2021 and was 1.05mg /dL, which  is stable .   Medication regimen as of TODAY is as follows:   Antihypertensive Medications               amLODIPine (NORVASC) 10 mg tablet (Taking) Take 1 tablet (10 mg total) by mouth daily    hydroCHLOROthiazide (HYDRODIURIL) 12.5 MG tablet Take 1 tablet (12.5 mg total) by mouth daily          - hydroCHLOROthiazide (HYDRODIURIL) 12.5 MG tablet; Take 1 tablet (12.5 mg total) by mouth daily  Dispense: 90 tablet; Refill: 1  - Basic metabolic panel; Future    3. Left groin pain  Recommend XR to assess for possible OA   - * Hip LEFT AP and Lateral with pelvis; Future        F/U: Follow up in about 2 months (around 04/13/2022) for Follow-up HTN.     SUBJECTIVE     Chief Complaint   Patient presents with    Other     Bilateral feet swelling       [name] presents for follow up. Primary concern today is lower extremity swelling.     Reports symptoms  began around Thanksgiving, Christmas   Both legs but left slightly worse than right   Symptoms are best in the morning and gradually worsen throughout the day  Has tried sitting with feet raised, wearing compression and these help some but not entirely   Not painful but feels tightness in skin    HTN -  BP well controlled  Medication: amlodipine 10 mg daily   Of note, she was previously on HCTZ and this was discontinued after appointment 11/07/2021   The medication change helped some with urinary concerns but not resolved     Also reports left groin pain when she goes from sitting to standing     Working with Dr. Teresa Pelton on GI concerns   Started citracel and this has helped with gas discomfort     ROS  Per above     I have reviewed the patient's past medical, surgical, family and medication histories and made appropriate corrections and updates in their respective parts of this chart.     Objective     Vitals:    02/12/22 1257   BP: 130/70   Pulse: 67  Weight: 66 kg (145 lb 8 oz)   Height: 1.6 m (5\' 3" )    Wt Readings from Last 3 Encounters:   02/12/22 66 kg (145 lb 8 oz)   01/05/22 65.1 kg (143 lb 9.6 oz)   12/12/21 66.5 kg (146 lb 9.6 oz)    BP Readings from Last 3 Encounters:   02/12/22 130/70   01/05/22 144/76   12/12/21 116/70        Physical Exam  General appearance: Well appearing, sitting in NAD.  Cardiovascular:  Warm and well perfused.  Pulmonary:  Non-labored breathing.  Skin: Warm and dry.  Neuro:  Alert and oriented.   Ext: WWP. 1+ pitting edema to mid shin BL with trace edema to knees BL. No calf tenderness. Negative homan sign BL.     Labs:  Lab Results   Component Value Date    NA 139 05/16/2021    K 4.5 05/16/2021    CL 100 05/16/2021    CO2 26 05/16/2021    UN 15 05/16/2021    CREAT 1.05 (H) 05/16/2021    VID25 58 05/16/2021    VB12 406 05/16/2021    WBC 5.3 05/16/2021    HGB 13.5 05/16/2021    HCT 42 05/16/2021    PLT 328 05/16/2021    TSH 2.34 04/25/2020    HA1C 5.6 05/16/2021    CHOL 201 (!)  05/16/2021    TRIG 91 05/16/2021    HDL 71 (H) 05/16/2021    LDLC 112 05/16/2021    CHHDC 2.8 05/16/2021       [name], MD  Family Medicine  02/12/22  1:39 PM

## 2022-02-12 NOTE — Patient Instructions (Addendum)
Continue amlodipine 10 mg daily   Add back HCTZ 12.5 mg daily     In 2 weeks, go to the lab to recheck kidney (around Feb 26)    Let me know if this:   1) helps the leg swelling   2) worsens any urinary concerns     Keep track of your blood pressure readings     Call with any concerns for high or low blood pressure         Get X ray of your left hip done and I will be in touch with the results

## 2022-02-26 ENCOUNTER — Other Ambulatory Visit
Admission: RE | Admit: 2022-02-26 | Discharge: 2022-02-26 | Disposition: A | Discharge status: Home / Self Care | Payer: Medicare (Managed Care) | Source: Ambulatory Visit | Attending: Family Medicine | Admitting: Family Medicine

## 2022-02-26 DIAGNOSIS — I10 Essential (primary) hypertension: Secondary | ICD-10-CM

## 2022-02-26 LAB — BASIC METABOLIC PANEL
Anion Gap: 12 (ref 7–16)
CO2: 25 mmol/L (ref 20–28)
Calcium: 9.8 mg/dL (ref 8.6–10.2)
Chloride: 101 mmol/L (ref 96–108)
Creatinine: 1.2 mg/dL — ABNORMAL HIGH (ref 0.51–0.95)
Glucose: 103 mg/dL — ABNORMAL HIGH (ref 60–99)
Lab: 20 mg/dL (ref 6–20)
Potassium: 4.2 mmol/L (ref 3.3–5.1)
Sodium: 138 mmol/L (ref 133–145)
eGFR BY CREAT: 45 * — AB

## 2022-02-28 ENCOUNTER — Telehealth: Payer: Self-pay | Admitting: Family Medicine

## 2022-02-28 DIAGNOSIS — R7989 Other specified abnormal findings of blood chemistry: Secondary | ICD-10-CM

## 2022-02-28 NOTE — Telephone Encounter (Signed)
-----   Message from Tillie Fantasia, MD sent at 02/27/2022  4:38 PM EST -----  Results have been received for workup recently undertaken for:     Patient:  [name] DOB:  08/02/1940  The results are mildly worsened renal function with addition of HCTZ back to antihypertensive regimen.   Nurse - can you call patient to see how she has been doing with the HCTZ back? How is her leg swelling? Is she having any increased urination or other urinary concerns?   Our office will communicate these results to patient via phone call  Tillie Fantasia, MD  February 27, 2022 4:37 PM

## 2022-02-28 NOTE — Telephone Encounter (Signed)
Spoke to patient, she stated that the swelling in her legs is improved and the she is not urinating as much as she was.   Patient is concerned about her worsening kidney function, I let her know that we would call her back with your response.

## 2022-02-28 NOTE — Telephone Encounter (Signed)
Recommend continuing current medications as prescribed.    In 1-2 weeks, recommend repeat BMP to reassess renal function.     Nurse - can you advise patient on the above? I recommend getting the lab done well hydrated.     Tillie Fantasia, MD

## 2022-02-28 NOTE — Telephone Encounter (Signed)
Spoke to patient, message provided, patient verbalized understand.

## 2022-03-06 ENCOUNTER — Ambulatory Visit
Admission: RE | Admit: 2022-03-06 | Discharge: 2022-03-06 | Disposition: A | Discharge status: Home / Self Care | Payer: Medicare (Managed Care) | Source: Ambulatory Visit | Attending: Vascular Surgery | Admitting: Vascular Surgery

## 2022-03-06 ENCOUNTER — Other Ambulatory Visit: Payer: Self-pay

## 2022-03-06 DIAGNOSIS — I714 Abdominal aortic aneurysm, without rupture, unspecified: Secondary | ICD-10-CM | POA: Insufficient documentation

## 2022-03-06 DIAGNOSIS — Z95828 Presence of other vascular implants and grafts: Secondary | ICD-10-CM | POA: Insufficient documentation

## 2022-03-06 LAB — CV DOPPLER AORTA COMPLETE
Aorta Diameter A-P Distal: 2.74 cm
Aorta Diameter A-P Mid: 3.21 cm
Aorta Diameter A-P Prox: 2.74 cm
Aorta Diameter Trans Distal: 2.94 cm
Aorta Diameter Trans Mid: 3.13 cm
Aorta Diameter Trans Prox: 2.79 cm
Aorta EDV Distal: 4.64 cm/s
Aorta EDV Mid: 4.64 cm/s
Aorta EDV Prox: 0 cm/s
Aorta PSV Distal: 20.26 cm/s
Aorta PSV Mid: 25.3 cm/s
Aorta PSV Prox: 34.23 cm/s
Left Common Iliac Artery Prox AP Diameter: 1.31 cm
Left Common Iliac Artery Prox EDV: 8.3 cm/s
Left Common Iliac Artery Prox PSV: 135.44 cm/s
Left Common Iliac Artery Prox Trans Diameter: 1.35 cm
Right Common Iliac Artery Prox AP Diameter: 1.62 cm
Right Common Iliac Artery Prox EDV: 0 cm/s
Right Common Iliac Artery Prox PSV: 75.81 cm/s
Right Common Iliac Artery Prox Trans Diameter: 1.38 cm

## 2022-03-13 ENCOUNTER — Other Ambulatory Visit
Admission: RE | Admit: 2022-03-13 | Discharge: 2022-03-13 | Disposition: A | Discharge status: Home / Self Care | Payer: Medicare (Managed Care) | Source: Ambulatory Visit | Attending: Family Medicine | Admitting: Family Medicine

## 2022-03-13 DIAGNOSIS — R7989 Other specified abnormal findings of blood chemistry: Secondary | ICD-10-CM | POA: Insufficient documentation

## 2022-03-13 LAB — BASIC METABOLIC PANEL
Anion Gap: 10 (ref 7–16)
CO2: 27 mmol/L (ref 20–28)
Calcium: 9.7 mg/dL (ref 8.6–10.2)
Chloride: 101 mmol/L (ref 96–108)
Creatinine: 1.19 mg/dL — ABNORMAL HIGH (ref 0.51–0.95)
Glucose: 98 mg/dL (ref 60–99)
Lab: 16 mg/dL (ref 6–20)
Potassium: 4.1 mmol/L (ref 3.3–5.1)
Sodium: 138 mmol/L (ref 133–145)
eGFR BY CREAT: 45 * — AB

## 2022-04-13 ENCOUNTER — Ambulatory Visit: Payer: Medicare (Managed Care) | Admitting: Family Medicine

## 2022-04-13 ENCOUNTER — Other Ambulatory Visit: Payer: Self-pay

## 2022-04-13 ENCOUNTER — Encounter: Payer: Self-pay | Admitting: Family Medicine

## 2022-04-13 VITALS — BP 126/78 | HR 65 | Temp 97.1°F | Ht 63.0 in | Wt 142.5 lb

## 2022-04-13 DIAGNOSIS — E785 Hyperlipidemia, unspecified: Secondary | ICD-10-CM

## 2022-04-13 DIAGNOSIS — I10 Essential (primary) hypertension: Secondary | ICD-10-CM

## 2022-04-13 DIAGNOSIS — N1831 Chronic kidney disease, stage 3a: Secondary | ICD-10-CM

## 2022-04-13 DIAGNOSIS — Z7189 Other specified counseling: Secondary | ICD-10-CM

## 2022-04-13 DIAGNOSIS — R609 Edema, unspecified: Secondary | ICD-10-CM

## 2022-04-13 NOTE — Patient Instructions (Addendum)
You were referred for an ultrasound for your leg swelling today    Get blood work done fasting prior to your physical

## 2022-04-13 NOTE — Progress Notes (Signed)
[facility]  Outpatient Progress Note     Date:  04/13/2022  Name:  [name]   MRN:  [id number]  DOB:  1940-05-08   Assessment and Plan     [name] is a 82 y.o. female presenting for follow up.    1. Essential hypertension  BP is at goal of <140/90. Potassium was last checked on 03/13/2022 and was 4.66mmol/L, which is within acceptable limits. Creatinine was last checked on 03/13/2022 and was 1.19mg /dL, which  stable, known CKD .   Medication regimen as of TODAY is as follows:   Antihypertensive Medications               hydroCHLOROthiazide (HYDRODIURIL) 12.5 MG tablet (Taking) Take 1 tablet (12.5 mg total) by mouth daily    amLODIPine (NORVASC) 10 mg tablet (Taking) Take 1 tablet (10 mg total) by mouth daily          Due for fasting labs prior to CPE   - CBC and differential; Future  - Comprehensive metabolic panel; Future  - TSH; Future  - Hemoglobin A1c; Future    2. Dependent edema  Symptoms c/w dependent edema. Discussed that amlodipine can contribute to LE swelling however BP currently well controlled, I do not recommend adjustment to medication at this time and she is in agreement. Continue managing with compression stockings when able, elevating legs at rest. Referred for [place] today to assess for venous insufficiency contributing.   - CV US Doppler Vein Lower Extremity Bilateral; Future    3. Stage 3a chronic kidney disease    4. Hyperlipidemia  - Lipid Panel (Reflex to Direct  LDL if Triglycerides more than 400); Future    5. Encounter for herb and vitamin supplement management  - Vitamin D; Future        F/U: Follow up in about 3 months (around 07/13/2022) for SWV.     SUBJECTIVE     Chief Complaint   Patient presents with    Follow-up    Hypertension     [name] presents for follow up.     Reports issues with her dependent edema. Compression stockings work but she does not wear over the summer. Minimal swelling in morning but when she gets up to walk she will have increased  swelling.     HTN -  BP at home has been good, average around today or 130s systolic   125/71, 124/58, 134/67, 127/60, 131/60, 120/53, 122/58, 115/62  No concerns   Antihypertensive Medications               hydroCHLOROthiazide (HYDRODIURIL) 12.5 MG tablet (Taking) Take 1 tablet (12.5 mg total) by mouth daily    amLODIPine (NORVASC) 10 mg tablet (Taking) Take 1 tablet (10 mg total) by mouth daily             ROS  Per above    I have reviewed the patient's past medical, surgical, family and medication histories and made appropriate corrections and updates in their respective parts of this chart.     Objective     Vitals:    04/13/22 0929   BP: 126/78   Pulse: 65   Temp: 36.2 C (97.1 F)   Weight: 64.6 kg (142 lb 8 oz)   Height: 1.6 m (5\' 3" )    Wt Readings from Last 3 Encounters:   04/13/22 64.6 kg (142 lb 8 oz)   02/12/22 66 kg (145 lb 8 oz)  01/05/22 65.1 kg (143 lb 9.6 oz)    BP Readings from Last 3 Encounters:   04/13/22 126/78   02/12/22 130/70   01/05/22 144/76        Physical Exam  General appearance: Well appearing, in NAD.  Cardiovascular:  Warm and well perfused.  Pulmonary:  Non-labored breathing.  Skin: Warm and dry.  Neuro:  Alert and oriented. CN II-XII grossly intact.    Labs:  Lab Results   Component Value Date    NA 138 03/13/2022    K 4.1 03/13/2022    CL 101 03/13/2022    CO2 27 03/13/2022    UN 16 03/13/2022    CREAT 1.19 (H) 03/13/2022    VID25 58 05/16/2021    VB12 406 05/16/2021    WBC 5.3 05/16/2021    HGB 13.5 05/16/2021    HCT 42 05/16/2021    PLT 328 05/16/2021    TSH 2.34 04/25/2020    HA1C 5.6 05/16/2021    CHOL 201 (!) 05/16/2021    TRIG 91 05/16/2021    HDL 71 (H) 05/16/2021    LDLC 112 05/16/2021    CHHDC 2.8 05/16/2021       [name], MD  Family Medicine  04/13/22  10:10 AM

## 2022-04-18 ENCOUNTER — Other Ambulatory Visit: Payer: Self-pay | Admitting: Family Medicine

## 2022-04-18 DIAGNOSIS — K219 Gastro-esophageal reflux disease without esophagitis: Secondary | ICD-10-CM

## 2022-04-18 NOTE — Telephone Encounter (Signed)
[facility] Refill Note  04/18/22  [name]  [birth year]  MRN: [id number]    Last office visit: 04/13/2022    Last telehome visit: Visit date not found    Recent Visits  Date Type Provider Dept   04/13/22 Office Visit [name], MD [facility]   02/12/22 Office Visit Schuppert, Jill Side, MD [facility]   12/12/21 Office Visit Schuppert, Jill Side, MD [facility]   11/07/21 Office Visit Schuppert, Jill Side, MD [facility]   Showing recent visits within past 185 days in an active department and meeting all other requirements  Future Appointments  No visits were found meeting these conditions.  Showing future appointments within next 0 days in an active department and meeting all other requirements      LABORATORY DATA:  Last Renal Func was  03/13/2022: Creatinine 1.19 mg/dL (H; Ref range: 5.40 - 9.81 mg/dL); eGFR BY CREAT 45 * (!; Ref range: *); Potassium 4.1 mmol/L (Ref range: 3.3 - 5.1 mmol/L).   Last LFTs was  06/18/2013: Bilirubin,Total 0.2 (Ref range: )  05/16/2021: ALT 20 U/L (Ref range: 0 - 35 U/L); AST 27 U/L (Ref range: 0 - 35 U/L).   Last Lipid panel was 05/16/2021: Cholesterol 201 mg/dL (!; Ref range: mg/dL); HDL 71 mg/dL (H; Ref range: 40 - 60 mg/dL); LDL Calculated 191 mg/dL (Ref range: mg/dL).  Last A1C was 05/16/2021: Hemoglobin A1C 5.6 % (Ref range: %).  Last TFT was 04/25/2020: TSH 2.34 uIU/mL (Ref range: 0.27 - 4.20 uIU/mL), No results found for requested labs within last 3650 days.[name]

## 2022-04-20 ENCOUNTER — Ambulatory Visit: Payer: Medicare (Managed Care) | Admitting: Gastroenterology

## 2022-04-24 ENCOUNTER — Other Ambulatory Visit: Payer: Self-pay

## 2022-04-24 ENCOUNTER — Ambulatory Visit
Admission: RE | Admit: 2022-04-24 | Discharge: 2022-04-24 | Disposition: A | Discharge status: Home / Self Care | Payer: Medicare (Managed Care) | Source: Ambulatory Visit | Attending: Vascular Surgery | Admitting: Vascular Surgery

## 2022-04-24 DIAGNOSIS — R2241 Localized swelling, mass and lump, right lower limb: Secondary | ICD-10-CM | POA: Insufficient documentation

## 2022-04-24 DIAGNOSIS — R2242 Localized swelling, mass and lump, left lower limb: Secondary | ICD-10-CM | POA: Insufficient documentation

## 2022-04-24 DIAGNOSIS — R609 Edema, unspecified: Secondary | ICD-10-CM | POA: Insufficient documentation

## 2022-04-25 ENCOUNTER — Telehealth: Payer: Self-pay | Admitting: Family Medicine

## 2022-04-25 NOTE — Telephone Encounter (Addendum)
Spoke to Pt and relayed message to Pt. Pt verbalized understanding and will call office back if she decides on the referral but needs some time to think on it. No other questions or concerns.       ----- Message from Nurse Stann Mainland sent at 04/25/2022  2:38 PM EDT -----  Results have been received for workup recently undertaken for:     Patient:  [name] DOB:  [birth year]  The results are within normal limits  Our office will communicate these results to patient via phone call   Nurse - can you let patient know that her ultrasound was normal? If she is still having bothersome symptoms I can put in a referral to vascular surgery. Let me know if she is interested in this. Thanks  [name], MD  April 25, 2022 2:37 PM

## 2022-04-30 ENCOUNTER — Other Ambulatory Visit: Payer: Self-pay | Admitting: Family Medicine

## 2022-04-30 DIAGNOSIS — I10 Essential (primary) hypertension: Secondary | ICD-10-CM

## 2022-04-30 MED ORDER — HYDROCHLOROTHIAZIDE 12.5 MG PO TABS *I*
12.5000 mg | ORAL_TABLET | Freq: Every day | ORAL | 1 refills | Status: DC
Start: 2022-04-30 — End: 2022-10-08

## 2022-04-30 MED ORDER — AMLODIPINE BESYLATE 10 MG PO TABS *I*
10.0000 mg | ORAL_TABLET | Freq: Every day | ORAL | 1 refills | Status: AC
Start: 2022-04-30 — End: 2022-10-27

## 2022-04-30 NOTE — Telephone Encounter (Signed)
[facility] Refill Note  04/30/22  [name]  Dec 15, 1940  MRN: [id number]    Last office visit: 04/13/2022    Last telehome visit: Visit date not found    Recent Visits  Date Type Provider Dept   04/13/22 Office Visit [name], MD [facility]   02/12/22 Office Visit Schuppert, Jill Side, MD [facility]   12/12/21 Office Visit Schuppert, Jill Side, MD [facility]   11/07/21 Office Visit Schuppert, Jill Side, MD [facility]   Showing recent visits within past 185 days in an active department and meeting all other requirements  Future Appointments  No visits were found meeting these conditions.  Showing future appointments within next 0 days in an active department and meeting all other requirements      LABORATORY DATA:  Last Renal Func was  03/13/2022: Creatinine 1.19 mg/dL (H; Ref range: 5.40 - 9.81 mg/dL); eGFR BY CREAT 45 * (!; Ref range: *); Potassium 4.1 mmol/L (Ref range: 3.3 - 5.1 mmol/L).   Last LFTs was  06/18/2013: Bilirubin,Total 0.2 (Ref range: )  05/16/2021: ALT 20 U/L (Ref range: 0 - 35 U/L); AST 27 U/L (Ref range: 0 - 35 U/L).   Last Lipid panel was 05/16/2021: Cholesterol 201 mg/dL (!; Ref range: mg/dL); HDL 71 mg/dL (H; Ref range: 40 - 60 mg/dL); LDL Calculated 191 mg/dL (Ref range: mg/dL).  Last A1C was 05/16/2021: Hemoglobin A1C 5.6 % (Ref range: %).  Last TFT was 04/25/2020: TSH 2.34 uIU/mL (Ref range: 0.27 - 4.20 uIU/mL), No results found for requested labs within last 3650 days.Steele Sizer, RN

## 2022-06-26 ENCOUNTER — Other Ambulatory Visit
Admission: RE | Admit: 2022-06-26 | Discharge: 2022-06-26 | Disposition: A | Discharge status: Home / Self Care | Payer: Medicare (Managed Care) | Source: Ambulatory Visit | Attending: Family Medicine | Admitting: Family Medicine

## 2022-06-26 DIAGNOSIS — Z79899 Other long term (current) drug therapy: Secondary | ICD-10-CM | POA: Insufficient documentation

## 2022-06-26 DIAGNOSIS — N1831 Chronic kidney disease, stage 3a: Secondary | ICD-10-CM | POA: Insufficient documentation

## 2022-06-26 DIAGNOSIS — I10 Essential (primary) hypertension: Secondary | ICD-10-CM | POA: Insufficient documentation

## 2022-06-26 DIAGNOSIS — Z7189 Other specified counseling: Secondary | ICD-10-CM | POA: Insufficient documentation

## 2022-06-26 DIAGNOSIS — E785 Hyperlipidemia, unspecified: Secondary | ICD-10-CM | POA: Insufficient documentation

## 2022-06-26 DIAGNOSIS — R609 Edema, unspecified: Secondary | ICD-10-CM | POA: Insufficient documentation

## 2022-06-26 LAB — COMPREHENSIVE METABOLIC PANEL
ALT: 21 U/L (ref 0–35)
AST: 28 U/L (ref 0–35)
Albumin: 4.5 g/dL (ref 3.5–5.2)
Alk Phos: 83 U/L (ref 35–105)
Anion Gap: 13 (ref 7–16)
Bilirubin,Total: 0.4 mg/dL (ref 0.0–1.2)
CO2: 24 mmol/L (ref 20–28)
Calcium: 9.9 mg/dL (ref 8.6–10.2)
Chloride: 100 mmol/L (ref 96–108)
Creatinine: 1.21 mg/dL — ABNORMAL HIGH (ref 0.51–0.95)
Glucose: 93 mg/dL (ref 60–99)
Lab: 22 mg/dL — ABNORMAL HIGH (ref 6–20)
Potassium: 4.2 mmol/L (ref 3.3–5.1)
Sodium: 137 mmol/L (ref 133–145)
Total Protein: 6.8 g/dL (ref 6.3–7.7)
eGFR BY CREAT: 45 * — AB

## 2022-06-26 LAB — CBC AND DIFFERENTIAL
Baso # K/uL: 0.1 10*3/uL (ref 0.0–0.2)
Eos # K/uL: 0.2 10*3/uL (ref 0.0–0.5)
Hematocrit: 42 % (ref 34–49)
Hemoglobin: 13.6 g/dL (ref 11.2–16.0)
IMM Granulocytes #: 0 10*3/uL (ref 0.0–0.0)
IMM Granulocytes: 0.5 %
Lymph # K/uL: 1.7 10*3/uL (ref 1.0–5.0)
MCV: 91 fL (ref 75–100)
Mono # K/uL: 0.6 10*3/uL (ref 0.1–1.0)
Neut # K/uL: 3.5 10*3/uL (ref 1.5–6.5)
Nucl RBC # K/uL: 0 10*3/uL (ref 0.0–0.0)
Nucl RBC %: 0 /100 WBC (ref 0.0–0.2)
Platelets: 347 10*3/uL (ref 150–450)
RBC: 4.7 MIL/uL (ref 4.0–5.5)
RDW: 13.5 % (ref 0.0–15.0)
Seg Neut %: 58.1 %
WBC: 6 10*3/uL (ref 3.5–11.0)

## 2022-06-26 LAB — LIPID PANEL
Chol/HDL Ratio: 2.9
Cholesterol: 205 mg/dL — AB
HDL: 70 mg/dL — ABNORMAL HIGH (ref 40–60)
LDL Calculated: 120 mg/dL
Non HDL Cholesterol: 135 mg/dL
Triglycerides: 76 mg/dL

## 2022-06-26 LAB — TSH: TSH: 2.25 u[IU]/mL (ref 0.27–4.20)

## 2022-06-26 LAB — HEMOGLOBIN A1C: Hemoglobin A1C: 5.6 %

## 2022-06-26 LAB — VITAMIN D: 25-OH Vit Total: 41 ng/mL (ref 30–60)

## 2022-07-20 ENCOUNTER — Other Ambulatory Visit: Payer: Self-pay | Admitting: Family Medicine

## 2022-07-20 DIAGNOSIS — E7800 Pure hypercholesterolemia, unspecified: Secondary | ICD-10-CM

## 2022-07-20 NOTE — Telephone Encounter (Signed)
[facility] Refill Note  07/20/22  [name]  1940/03/19  MRN: [id number]    Last office visit: 04/13/2022    Last telehome visit: Visit date not found    Recent Visits  Date Type Provider Dept   04/13/22 Office Visit [name], MD [facility]   02/12/22 Office Visit Schuppert, Jill Side, MD [facility]   Showing recent visits within past 185 days in an active department and meeting all other requirements  Future Appointments  No visits were found meeting these conditions.  Showing future appointments within next 0 days in an active department and meeting all other requirements      LABORATORY DATA:  Last Renal Func was  06/26/2022: Creatinine 1.21 mg/dL (H; Ref range: 9.32 - 6.71 mg/dL); eGFR BY CREAT 45 * (!; Ref range: *); Potassium 4.2 mmol/L (Ref range: 3.3 - 5.1 mmol/L).   Last LFTs was  06/18/2013: Bilirubin,Total 0.2 (Ref range: )  06/26/2022: ALT 21 U/L (Ref range: 0 - 35 U/L); AST 28 U/L (Ref range: 0 - 35 U/L).   Last Lipid panel was 06/26/2022: Cholesterol 205 mg/dL (!; Ref range: mg/dL); HDL 70 mg/dL (H; Ref range: 40 - 60 mg/dL); LDL Calculated 245 mg/dL (Ref range: mg/dL).  Last A1C was 06/26/2022: Hemoglobin A1C 5.6 % (Ref range: %).  Last TFT was 06/26/2022: TSH 2.25 uIU/mL (Ref range: 0.27 - 4.20 uIU/mL), No results found for requested labs within last 3650 days.Demetrius Charity

## 2022-07-23 ENCOUNTER — Other Ambulatory Visit: Payer: Self-pay

## 2022-07-23 ENCOUNTER — Ambulatory Visit: Payer: Medicare (Managed Care) | Admitting: Family Medicine

## 2022-07-23 ENCOUNTER — Encounter: Payer: Self-pay | Admitting: Family Medicine

## 2022-07-23 VITALS — BP 122/68 | HR 67 | Temp 97.0°F | Ht 62.6 in | Wt 144.4 lb

## 2022-07-23 DIAGNOSIS — R609 Edema, unspecified: Secondary | ICD-10-CM

## 2022-07-23 DIAGNOSIS — M810 Age-related osteoporosis without current pathological fracture: Secondary | ICD-10-CM

## 2022-07-23 DIAGNOSIS — E785 Hyperlipidemia, unspecified: Secondary | ICD-10-CM

## 2022-07-23 DIAGNOSIS — I10 Essential (primary) hypertension: Secondary | ICD-10-CM

## 2022-07-23 DIAGNOSIS — Z0000 Encounter for general adult medical examination without abnormal findings: Secondary | ICD-10-CM

## 2022-07-23 DIAGNOSIS — Z1231 Encounter for screening mammogram for malignant neoplasm of breast: Secondary | ICD-10-CM

## 2022-07-23 DIAGNOSIS — K219 Gastro-esophageal reflux disease without esophagitis: Secondary | ICD-10-CM

## 2022-07-23 NOTE — Progress Notes (Signed)
Physical Exam Visit    [name] is 82 y.o. female presenting for a full physical exam.     Specific Concern's today also include:    1) Reports a lot of stress, family health issues   Sister diagnosed with Alzheimer's (49 yo)  Another sister with a blood clot in lungs       2) GERD -   Inquiring about reducing famotidine   Tums works prn   Still has some belching symptoms but her reflux is better       3) Leg swelling -   Dependent edema   The other day went to the store and came home with significant swelling BL legs   Improves when elevating legs, using compression stockings       4) HTN -   Home readings have been around 120/60-70s   Antihypertensive Medications               amLODIPine (NORVASC) 10 mg tablet (Taking) Take 1 tablet (10 mg total) by mouth daily.    hydroCHLOROthiazide 12.5 MG tablet (Taking) Take 1 tablet (12.5 mg total) by mouth daily.          ~~~    ROS positive for: per above    ROS negative for:  Unintended weight loss  Night sweats   Chest pain  Decreased exercise tolerance  New Cough  Change in bowel habits   Hematuria   Pain or difficulty swallowing  New or worsening headaches  Vision changes   Unusual bleeding or bruising       Patient Active Problem List   Diagnosis Code    AAA (abdominal aortic aneurysm) I71.40    Osteoporosis M81.0    Hyperlipidemia E78.5    GERD (gastroesophageal reflux disease) K21.9    Low back pain M54.50    Gallstones K80.20    Benign neoplasm of colon D12.6    Essential hypertension I10    Wears dentures Z97.2    Patient has healthcare proxy Z78.9    AAA (abdominal aortic aneurysm) without rupture - s/p EVAR & Right CFA endarterectomy 11/14/16 I71.40    Positive colorectal cancer screening using Cologuard test R19.5    Claustrophobia F40.240    Allergic rhinitis J30.9    Mixed stress and urge urinary incontinence N39.46    Stage 3a chronic kidney disease N18.31    Dependent edema R60.9       Current Outpatient Medications   Medication Sig    simvastatin  (ZOCOR) 20 mg tablet TAKE 1 TABLET BY MOUTH EVERY DAY WITH DINNER    amLODIPine (NORVASC) 10 mg tablet Take 1 tablet (10 mg total) by mouth daily.    hydroCHLOROthiazide 12.5 MG tablet Take 1 tablet (12.5 mg total) by mouth daily.    senna (SENOKOT) 8.6 mg tablet Take 2 tablets by mouth daily  for Constipation    polyethylene glycol (GLYCOLAX,MIRALAX) 17 g powder packet Take by mouth daily    Calcium Carbonate-Vitamin D (CALCIUM 500 + D PO) Take 1 tablet by mouth 2 times daily    aspirin 81 MG tablet Take 1 tablet (81 mg total) by mouth daily    famotidine (PEPCID) 20 mg tablet TAKE 1 TABLET BY MOUTH TWO TIMES DAILY AS NEEDED FOR HEARTBURN    bisacodyl (DULCOLAX) 10 mg suppository Place 1 suppository (10 mg total) rectally daily as needed  for 2nd line constipation if not improving with miralax, senna    melatonin 3 mg Take 1 tablet (3  mg total) by mouth nightly as needed for Sleep.       Allergies   Allergen Reactions    Fentanyl Nausea And Vomiting    Oxycodone Nausea And Vomiting     When in hospital for AAA repair    Environmental Allergies Other (See Comments)     Nasal congestion    Lipitor [Atorvastatin] Other (See Comments)     Elevated LFTs    Tramadol Other (See Comments)     "felt like a zombie"       Past Medical History:   Diagnosis Date    AAA (abdominal aortic aneurysm)     Chronic pain     Elevated LFTs     Unclear if this was related to statin, gallstones, alcohol consumption     Essential hypertension 06/11/2016    GERD (gastroesophageal reflux disease)     High blood pressure     HLD (hyperlipidemia)     Hx of colonic polyps     Colonscopy 08/11/10; repeat 2017 per GI     Lumbar disc herniation 2006    L5-S1    Osteoporosis     Spondylosis     Urinary retention        Past Surgical History:   Procedure Laterality Date    cataract repair Bilateral     COLONOSCOPY  2011    HYSTERECTOMY      left ovaries in place     TUBAL LIGATION         Social History     Socioeconomic History    Marital status:  Divorced   Tobacco Use    Smoking status: Former     Packs/day: 1.00     Years: 30.00     Additional pack years: 0.00     Total pack years: 30.00     Types: Cigarettes     Quit date: 06/15/2013     Years since quitting: 9.1    Smokeless tobacco: Former   Substance and Sexual Activity    Alcohol use: Yes     Alcohol/week: 7.0 standard drinks of alcohol     Types: 7 Glasses of wine per week     Comment: 1/day    Drug use: No    Sexual activity: Not Currently   Social History Narrative    Lives alone currently in townhome     2 brothers and 2 sisters that live here    2 boys ([place], [place])    Grandchildren          Family History   Problem Relation Age of Onset    Dementia Mother 97    Cancer Mother         lung    High cholesterol Mother     Stroke Mother     Parkinsonism Father     Heart Disease Father 23        passed    High cholesterol Father     Heart attack Father     Cancer Sister         Breast; in recovery    High cholesterol Sister     Hip fracture Sister     Breast cancer Sister 20    Cancer Brother         prostate    High cholesterol Brother     Diabetes Brother     Aneurysm Neg Hx     Anesthesia problems Neg Hx     Colon cancer Neg  Hx     Colon polyps Neg Hx     Ovarian cancer Neg Hx        Review Flowsheet  More data exists         07/23/2022 05/03/2021 01/17/2021 09/08/2020 02/17/2020 12/04/2019 08/04/2018   PHQ Scores   PSQ2 Q1 - Interest/Pleasure - - - - - - Y   PSQ2 Q2 - Down, Depressed, Hopeless - - - - - - Y   PHQ Q9 - Better Off Dead 0 - - - - - -   PHQ Calculated Score 10 0 1 0 4 2 -      Details                   Aging Concerns   Trouble Hearing - No  []  Yes  [x]  Details: has to increase volume on TV, reports family members are often soft spoken and she has to ask them to repeat what they say   Memory Concerns - No  []  Yes  [x]  Details: short term memory. Takes time for things to come to her. Keeps lists.    Fall in past year - No  [x]  Yes  []  Details:   Worsening Balance - No  []  Yes  [x]   Details: legs feel weaker in morning, improves when she is up and moving. Stairs are generally difficult. Limited exercise with LBP.    Environmental Safety    Parameter Yes No Sometimes NA Details   Sunscreen  x   Recommended    Smoke detectors x       CO detectors    x [place] [employer]    Seatbelt  x       Bicycling Helmet     x      Health Care Maintenance   Parameter UTD Need NA Declined Comment   Dental Cleaning   x  dentures   Vision Exam x       CRC screen   x      Dexa (F65+M70+)  x      Mammogram (F40-75)  x      Pap (F21-65)   x     PSA (M45AA/50+)   x     Abd [place] (M65+)   x     Screening CT  >50 20pk yr   x     EKG   x     Lipid Panel  x       HbA1c x       STD screen   x     Hepatitis C    x    Tdap x       Prevnar/Pneumovax x       Prevnar-20   x     Shingrix    x    Flu x       Covid x       Covid booster x         BP 122/68   Pulse 67   Temp 36.1 C (97 F)   Ht 1.59 m (5' 2.6")   Wt 65.5 kg (144 lb 6.4 oz)   LMP  (LMP Unknown)   SpO2 98%   BMI 25.91 kg/m     GEN: Pleasant well adult in NAD.   HEENT: Normocephalic and atraumatic. PERRL. Moist mucous membranes. TM's clear BL, no pharyngeal erythema  PULM: Easy respirations, well aerated, CTA bilaterally.   CVS: RRR, no murmur, normal S1& S2.   ABD:  soft,  nontender, nondistended, no hepatosplenomegaly, no masses.   SKIN: No rashes or concerning lesions    NEURO: alert, oriented, CN II-XII intact, ambulates to and from exam table with caution when stepping up  Ext: no clubbing, edema or cyanosis       Assessment/Plan   1. Preventative health care    2. Annual physical exam    3. Essential hypertension  BP is at goal of <140/90. Potassium was last checked on 06/26/2022 and was 4.67mmol/L, which is within acceptable limits. Creatinine was last checked on 06/26/2022 and was 1.21mg /dL, which  is stable .   Medication regimen as of TODAY is as follows:   Antihypertensive Medications               amLODIPine (NORVASC) 10 mg tablet (Taking) Take 1 tablet  (10 mg total) by mouth daily.    hydroCHLOROthiazide 12.5 MG tablet (Taking) Take 1 tablet (12.5 mg total) by mouth daily.          4. Dependent edema  Reviewed effect of CCBs on LE edema. With BP well controlled, discussed option of reducing amlodipine to 5 mg daily she will try this and monitor BP and leg swelling symptoms.   Continue compression socks prn.   Continue elevating legs when resting.   Follow up 2 mo.    5. Hyperlipidemia  Reviewed lipid panel results, stable  Continue simvastatin as prescribed     6. Gastroesophageal reflux disease  Chronic condition, stable, well controlled  Reduce famotidine to 20 mg daily  If symptoms remain well controlled after 2-3 weeks can discontinue    7. Age-related osteoporosis without current pathological fracture  Due for DEXA  Continue calcium, vit D supplements  Encouraged weight bearing activity as tolerated   - DEXA Scan; Future    8. Encounter for screening mammogram for malignant neoplasm of breast  - Mammography screening BILATERAL; Future     Care Planning    Health Care Proxy: on file        [name], MD

## 2022-07-23 NOTE — Progress Notes (Signed)
Visit performed as:             Office Visit, met with patient in person    Today we reviewed and updated [name]'s smoking status, activities of daily living, depression screen, fall risk, medications and allergies.   I have counseled the patient in the above areas.     Subjective:     Chief Complaint: [name] is a 82 y.o. female here for a/an Annual Exam and Subsequent Annual Medicare Visit    In general, [name] rates their overall health as:  poor      Patient Care Team:  [name], MD as PCP - General (Family Medicine)  Ermalinda Memos, MD as Consulting Provider (Cardiology)  Ernestine Mcmurray, MD as Consulting Provider (Physical Medicine and Rehabilitation)  Newhall, Gerhard Perches, MD as Consulting Provider (Vascular Surgery)  Carinci, Orson Aloe, MD as Anesthesiologist (Pain Medicine)     Current Outpatient Medications on File Prior to Visit   Medication Sig Dispense Refill    simvastatin (ZOCOR) 20 mg tablet TAKE 1 TABLET BY MOUTH EVERY DAY WITH DINNER 90 tablet 3    amLODIPine (NORVASC) 10 mg tablet Take 1 tablet (10 mg total) by mouth daily. 90 tablet 1    hydroCHLOROthiazide 12.5 MG tablet Take 1 tablet (12.5 mg total) by mouth daily. 90 tablet 1    senna (SENOKOT) 8.6 mg tablet Take 2 tablets by mouth daily  for Constipation 60 tablet 2    polyethylene glycol (GLYCOLAX,MIRALAX) 17 g powder packet Take by mouth daily      Calcium Carbonate-Vitamin D (CALCIUM 500 + D PO) Take 1 tablet by mouth 2 times daily      aspirin 81 MG tablet Take 1 tablet (81 mg total) by mouth daily 90 tablet 1    famotidine (PEPCID) 20 mg tablet TAKE 1 TABLET BY MOUTH TWO TIMES DAILY AS NEEDED FOR HEARTBURN 60 tablet 5    bisacodyl (DULCOLAX) 10 mg suppository Place 1 suppository (10 mg total) rectally daily as needed  for 2nd line constipation if not improving with miralax, senna 12 suppository 0    melatonin 3 mg Take 1 tablet (3 mg total) by mouth nightly as needed for Sleep.       No current  facility-administered medications on file prior to visit.     Allergies   Allergen Reactions    Fentanyl Nausea And Vomiting    Oxycodone Nausea And Vomiting     When in hospital for AAA repair    Environmental Allergies Other (See Comments)     Nasal congestion    Lipitor [Atorvastatin] Other (See Comments)     Elevated LFTs    Tramadol Other (See Comments)     "felt like a zombie"     Patient Active Problem List    Diagnosis Date Noted    Essential hypertension 06/11/2016     Priority: High    Osteoporosis 10/15/2013     Priority: High     Fosamax started 2015?      Low back pain 10/15/2013     Priority: High       Saw Dr Mitzie Na.         Benign neoplasm of colon 11/03/2013     Priority: Medium     Removed via colonscopy 2012; told to repeat 2017       AAA (abdominal aortic aneurysm) 10/15/2013     Priority: Medium     Due for repeat  U/S 09/2016 ; Noted 06/02/13: 3.8X3.5 cm partially thrombosed; 03/2016 stable at 4.77 cm- continued surveillance with vascular surgery                Hyperlipidemia 10/15/2013     Priority: Medium    Wears dentures 06/11/2016     Priority: Low    Patient has healthcare proxy 06/11/2016     Priority: Low     Drexel Iha (son), Tarri Fuller (sister)       Gallstones 10/23/2013     Priority: Low     Noted U/S: 06/02/13: There is gallbladder sludge/crushed gallstones. No gallbladder wall thickening, no sonographic Murphy's sign. Common bile duct 4mm.       GERD (gastroesophageal reflux disease) 10/15/2013     Priority: Low    Stage 3a chronic kidney disease 04/13/2022    Dependent edema 04/13/2022    Mixed stress and urge urinary incontinence 11/07/2021    Allergic rhinitis 11/21/2017    Positive colorectal cancer screening using Cologuard test 09/25/2017    Claustrophobia 09/25/2017    AAA (abdominal aortic aneurysm) without rupture - s/p EVAR & Right CFA endarterectomy 11/14/16 10/26/2016     Past Medical History:   Diagnosis Date    AAA (abdominal aortic aneurysm)     Chronic pain      Elevated LFTs     Unclear if this was related to statin, gallstones, alcohol consumption     Essential hypertension 06/11/2016    GERD (gastroesophageal reflux disease)     High blood pressure     HLD (hyperlipidemia)     Hx of colonic polyps     Colonscopy 08/11/10; repeat 2017 per GI     Lumbar disc herniation 2006    L5-S1    Osteoporosis     Spondylosis     Urinary retention      Past Surgical History:   Procedure Laterality Date    cataract repair Bilateral     COLONOSCOPY  2011    HYSTERECTOMY      left ovaries in place     TUBAL LIGATION       Family History   Problem Relation Age of Onset    Dementia Mother 59    Cancer Mother         lung    High cholesterol Mother     Stroke Mother     Parkinsonism Father     Heart Disease Father 53        passed    High cholesterol Father     Heart attack Father     Cancer Sister         Breast; in recovery    High cholesterol Sister     Hip fracture Sister     Breast cancer Sister 11    Cancer Brother         prostate    High cholesterol Brother     Diabetes Brother     Aneurysm Neg Hx     Anesthesia problems Neg Hx     Colon cancer Neg Hx     Colon polyps Neg Hx     Ovarian cancer Neg Hx      Social History     Socioeconomic History    Marital status: Divorced   Tobacco Use    Smoking status: Former     Packs/day: 1.00     Years: 30.00     Additional pack years: 0.00     Total pack  years: 30.00     Types: Cigarettes     Quit date: 06/15/2013     Years since quitting: 9.1    Smokeless tobacco: Former   Substance and Sexual Activity    Alcohol use: Yes     Alcohol/week: 14.0 standard drinks of alcohol     Types: 14 Glasses of wine per week     Comment: 2/day    Drug use: No    Sexual activity: Not Currently   Social History Narrative    Lives alone currently in townhome     2 brothers and 2 sisters that live here    2 boys ([place], [place])    Grandchildren        Objective:     Vital Signs: BP 122/68   Pulse 67   Temp 36.1 C (97 F)   Ht 1.59 m (5' 2.6")   Wt  65.5 kg (144 lb 6.4 oz)   LMP  (LMP Unknown)   SpO2 98%   BMI 25.91 kg/m    BMI: Body mass index is 25.91 kg/m.    Vision Screening Results (Welcome visit only):  No results found.    Depression Screening Results:  Review Flowsheet  More data exists         07/23/2022 05/03/2021 01/17/2021 09/08/2020 02/17/2020 12/04/2019 08/04/2018   PHQ Scores   PSQ2 Q1 - Interest/Pleasure - - - - - - Y   PSQ2 Q2 - Down, Depressed, Hopeless - - - - - - Y   PHQ Q9 - Better Off Dead 0 - - - - - -   PHQ Calculated Score 10 0 1 0 4 2 -      Details                 Promis Cat V1.0 - Depression    07/16/2022 11:42 AM EDT - Filed by Patient   I felt depressed Sometimes   I felt hopeless Rarely   I felt worthless Never   I felt helpless Sometimes   PROMIS Depression T-Score (range: 10 - 90)  58 (mild)       Opioid Use/DAST- 10 Screening Results:   How many times in the past year have you used an illegal drug or used a prescription medication for nonmedical reasons?: 0 (07/23/2022  9:34 AM)    Activities of Daily Living/Functional Screening Results:  Is the person deaf or does he/she have serious difficulty hearing?: Y (07/23/2022  9:34 AM)  Hearing Status: No impairment (07/23/2022  9:34 AM)  Is this person blind or does he/she have serious difficulty seeing even when wearing glasses?: Y (07/23/2022  9:34 AM)  *Vision Status: Visual aid  (07/23/2022  9:34 AM)  Does this person have serious difficulty walking or climbing stairs?: Y (07/23/2022  9:34 AM)  Meredeth Ide in Home: Unable to Assess (07/23/2022  9:34 AM)  *Climbing Stairs: Unable to Assess (07/23/2022  9:34 AM)  Does this person have difficulty dressing or bathing?: N (07/23/2022  9:34 AM)  *Shopping: Needs Assistance (07/23/2022  9:34 AM)  *House Keeping: Independent (07/23/2022  9:34 AM)  *Managing Own Medications: Independent (07/23/2022  9:34 AM)  *Handling Finances: Independent (07/23/2022  9:34 AM)  Difficulty doing errands due to a physicial, mental or emotional condition: No (07/23/2022  9:34  AM)  Difficulty remembering or making decisions due to a physicial, mental or emotional condition: No (07/23/2022  9:34 AM)      Fall Risk Screening Results:  Have you fallen  in the last year?: No (07/23/2022  9:33 AM)  Do you feel you are at risk for falling?: No (07/23/2022  9:33 AM)      Assessment and Plan:     Cognitive Function:  Recall of recent and remote events appears:  Normal      Advanced Care Planning:  HCP on file , confirmed with patient in chart today    The following health maintenance plan was reviewed with the patient:    Health Maintenance Topics with due status: Overdue       Topic Date Due    HIV Screening [place] Never done    Hepatitis C Screening USPSTF/ Never done    COVID-19 Vaccine 02/16/2022     Health Maintenance Topics with due status: Postponed       Topic Postponed Until    IMM-Zoster 09/15/2022 (Originally 08/29/1990)     Health Maintenance Topics with due status: Not Due       Topic Last Completion Date    IMM DTaP/Tdap/Td 06/11/2016    IMM-Influenza 10/06/2021    Depression Screen Monthly 07/23/2022    Fall Risk Screening 07/23/2022     Health Maintenance Topics with due status: Completed       Topic Last Completion Date    IMM Pneumo: 65+ Years 12/10/2014     Health Maintenance Topics with due status: Aged [employer] Date Due    IMM-Hepatitis B Vaccine Aged Out    IMM-HIB 0-5 Yrs or At-Risk Patients Aged Out    IMM-HPV 9-26 Yrs or Shared Decision (27-45 Yrs) Aged Out    IMM-MCV4 0-18 Yrs or At-Risk Patients Aged Out    IMM-Rotavirus 0-8 Months Aged Out     This health maintenance schedule, identified risks, a list of orders placed today and patient goals have been provided to [name] in the after visit summary.     Plan for any concerns identified during screening or risk assessments:  n/a    [name], MD

## 2022-07-23 NOTE — Patient Instructions (Addendum)
Reduce your amlodipine to 5 mg daily   You can cut the pills you have in half   If you have any issues with this, please reach out and I can send a new script     Check your blood pressure at home, call if consistently around 140/90 or above     Reduce famotidine to 20 mg daily   If this goes well, after 2-3 weeks you can discontinue this     Thank you for completing your Annual Exam and Subsequent Annual Medicare Visit   with [place] today.     The purpose of this visits was to:    Screen for disease  Assess risk of future medical problems  Help develop a healthy lifestyle  Update vaccines  Get to know your doctor in case of an illness    Patient Care Team:  [name], MD as PCP - General (Family Medicine)  Ermalinda Memos, MD as Consulting Provider (Cardiology)  Ernestine Mcmurray, MD as Consulting Provider (Physical Medicine and Rehabilitation)  Newhall, Gerhard Perches, MD as Consulting Provider (Vascular Surgery)  Carinci, Orson Aloe, MD as Anesthesiologist (Pain Medicine)     Medicare 5 Year Plan    The following items were identified as areas of concern during your screening today:  High Blood Pressure (Hypertension) - This is a risk factor for Heart Attack, Stroke, Kidney Problems and Eye Problems.   BMI greater than 25 - This is a risk for Heart Attack, Stroke, High Blood Pressure, Diabetes, High Cholesterol and other complications.       The Health Maintenance table below identifies screening tests and immunizations recommended by your health care team:  Health Maintenance: These screening recommendations are based on USPSTF, [employer], and [place] state guidelines   Topic Date Due    HIV Screening  Never done    Hepatitis C Screening  Never done    COVID-19 Vaccine (7 - 2023-24 season) 02/16/2022    Shingles Vaccine (1 of 2) 09/15/2022 (Originally 08/29/1990)    Depression Screen Monthly  08/23/2022    Flu Shot (1) 09/02/2022    Fall Risk Screening  07/23/2023    DTaP/Tdap/Td Vaccines (2 - Td or Tdap)  06/12/2026    Pneumococcal Vaccination  Completed    Hepatitis B Vaccine  Aged Out    HIB Vaccine  Aged Out    HPV Vaccine  Aged Out    Meningococcal Vaccine  Aged Out    Rotavirus Vaccine  Aged Out     In addition, goals and orders placed to address these recommendations are listed in the "Today's Visit" section.    We wish you the best of health and look forward to seeing you again next year for your Annual Medicare Wellness Visit.     If you have any health care concerns before then, please do not hesitate to contact us.

## 2022-08-07 ENCOUNTER — Ambulatory Visit
Admission: RE | Admit: 2022-08-07 | Discharge: 2022-08-07 | Disposition: A | Discharge status: Home / Self Care | Payer: Medicare (Managed Care) | Source: Ambulatory Visit | Attending: Family Medicine | Admitting: Family Medicine

## 2022-08-07 DIAGNOSIS — Z1231 Encounter for screening mammogram for malignant neoplasm of breast: Secondary | ICD-10-CM | POA: Insufficient documentation

## 2022-08-23 ENCOUNTER — Encounter: Payer: Self-pay | Admitting: Family Medicine

## 2022-09-17 ENCOUNTER — Encounter: Payer: Self-pay | Admitting: Family Medicine

## 2022-10-01 ENCOUNTER — Ambulatory Visit: Payer: Medicare (Managed Care) | Admitting: Family Medicine

## 2022-10-01 ENCOUNTER — Other Ambulatory Visit: Payer: Self-pay

## 2022-10-01 ENCOUNTER — Encounter: Payer: Self-pay | Admitting: Family Medicine

## 2022-10-01 VITALS — BP 115/70 | HR 73 | Temp 96.3°F | Ht 65.0 in | Wt 146.0 lb

## 2022-10-01 DIAGNOSIS — I10 Essential (primary) hypertension: Secondary | ICD-10-CM

## 2022-10-01 DIAGNOSIS — R140 Abdominal distension (gaseous): Secondary | ICD-10-CM

## 2022-10-01 DIAGNOSIS — K219 Gastro-esophageal reflux disease without esophagitis: Secondary | ICD-10-CM

## 2022-10-01 DIAGNOSIS — M810 Age-related osteoporosis without current pathological fracture: Secondary | ICD-10-CM

## 2022-10-01 DIAGNOSIS — R609 Edema, unspecified: Secondary | ICD-10-CM

## 2022-10-01 MED ORDER — AMLODIPINE BESYLATE 5 MG PO TABS *I*
5.0000 mg | ORAL_TABLET | Freq: Every day | ORAL | 1 refills | Status: DC
Start: 2022-10-01 — End: 2023-03-11

## 2022-10-01 NOTE — Progress Notes (Signed)
[facility]     Subjective     [name] is a 82 y.o. female who presents for Follow-up, Hypertension, and Leg Swelling (Ankle swelling for 8 months )  History of Present Illness  The patient is an 82 year old female presenting for follow-up.    HTN -   BP has been 120-130/60-72 on average at home. She has not monitored in the last week or so but readings prior were in this range. She feels well.     Dependent edema -   She reports legs have a tendency to swell BL by end of day with activities. This is alleviated by elevating legs and use of compression socks. She also uses a pedal exerciser to improve circulation. She notes that eating seems to exacerbate the swelling at times. Recently, she purchased a stair stepper to help with mobility. She used the stair stepper all day yesterday and did not experience any swelling in her legs.     GERD, abdominal bloating -  Lifelong issue. Met with Dr. Teresa Pelton who recommended some additional testing that she has not done and is not interested in pursuing due to location of testing. Her bowel movements are regular. She occasionally experiences gas and mucus in her stools. She has increased her dietary fiber intake as recommended. Her heartburn has resolved, allowing her to reduce her famotidine dosage to once daily. However, she still experiences symptoms of GERD intermittently.     Vision concern -   She began experiencing double vision about a month ago, which only occurs when she is reclined and resolves after blinking a few times. She does not experience this during the day. She has had cataract surgery and laser treatment in the past. She is currently using glasses but is dissatisfied with the prescription as she still struggles with reading.       Objective   Blood pressure 115/70, pulse 73, temperature 35.7 C (96.3 F), height 1.651 m (5\' 5" ), weight 66.2 kg (146 lb), SpO2 95%.  Physical Exam  General appearance: Well appearing, in NAD.  Cardiovascular:   Warm and well perfused.  Pulmonary:  Non-labored breathing.  Skin: Warm and dry.  Neuro:  Alert and oriented. CN II-XII grossly intact.      Results            Assessment & Plan  1. Essential hypertension (Primary)  BP is at goal of <140/90. Potassium was last checked on 06/26/2022 and was 4.46mmol/L, which is within acceptable limits. Creatinine was last checked on 06/26/2022 and was 1.21mg /dL, which  is stable . Continue current medications as prescribed.    Medication regimen as of TODAY is as follows:   Antihypertensive Medications               hydroCHLOROthiazide 12.5 MG tablet (Taking) Take 1 tablet (12.5 mg total) by mouth daily.    amLODIPine (NORVASC) 5 mg tablet Take 1 tablet (5 mg total) by mouth daily.          2. Dependent edema  Chronic condition with improvement after reduction in amlodipine to 5 mg daily. Continue elevating legs when seated, use of compression stockings, and regular activity.     3. Age-related osteoporosis without current pathological fracture  Due for DEXA, scheduled for 10/18    4. GERD (gastroesophageal reflux disease)  5. Abdominal bloating  Encouraged to follow up with GI  Offered referral to new GI specialist, she may consider   Continue famotidine as prescribed  Encouraged to follow up with eye doctor regarding vision concern. No warning symptoms.                    Author: [name], MD  Note signed: 10/01/2022

## 2022-10-06 ENCOUNTER — Other Ambulatory Visit: Payer: Self-pay | Admitting: Family Medicine

## 2022-10-06 DIAGNOSIS — I10 Essential (primary) hypertension: Secondary | ICD-10-CM

## 2022-10-08 NOTE — Telephone Encounter (Signed)
[facility] Refill Note  10/08/22  [name]  [birth year]  MRN: [id number]    Last office visit: 10/01/2022    Last telehome visit: Visit date not found    Recent Visits  Date Type Provider Dept   10/01/22 Office Visit [name], MD [facility]   07/23/22 Office Visit Schuppert, Jill Side, MD [facility]   04/13/22 Office Visit Schuppert, Jill Side, MD [facility]   Showing recent visits within past 185 days in an active department and meeting all other requirements  Future Appointments  No visits were found meeting these conditions.  Showing future appointments within next 0 days in an active department and meeting all other requirements      LABORATORY DATA:  Last Renal Func was  06/26/2022: Creatinine 1.21 mg/dL (H; Ref range: 5.40 - 9.81 mg/dL); eGFR BY CREAT 45 * (!; Ref range: *); Potassium 4.2 mmol/L (Ref range: 3.3 - 5.1 mmol/L).   Last LFTs was  06/18/2013: Bilirubin,Total 0.2 (Ref range: )  06/26/2022: ALT 21 U/L (Ref range: 0 - 35 U/L); AST 28 U/L (Ref range: 0 - 35 U/L).   Last Lipid panel was 06/26/2022: Cholesterol 205 mg/dL (!; Ref range: mg/dL); HDL 70 mg/dL (H; Ref range: 40 - 60 mg/dL); LDL Calculated 191 mg/dL (Ref range: mg/dL).  Last A1C was 06/26/2022: Hemoglobin A1C 5.6 % (Ref range: %).  Last TFT was 06/26/2022: TSH 2.25 uIU/mL (Ref range: 0.27 - 4.20 uIU/mL), No results found for requested labs within last 3650 days.[name]

## 2022-10-19 ENCOUNTER — Ambulatory Visit
Admission: RE | Admit: 2022-10-19 | Discharge: 2022-10-19 | Disposition: A | Discharge status: Home / Self Care | Payer: Medicare (Managed Care) | Source: Ambulatory Visit

## 2022-10-19 DIAGNOSIS — M810 Age-related osteoporosis without current pathological fracture: Secondary | ICD-10-CM

## 2022-10-24 ENCOUNTER — Encounter: Payer: Self-pay | Admitting: Family Medicine

## 2022-10-25 ENCOUNTER — Other Ambulatory Visit: Payer: Self-pay | Admitting: Family Medicine

## 2022-10-25 DIAGNOSIS — I10 Essential (primary) hypertension: Secondary | ICD-10-CM

## 2022-10-25 MED ORDER — HYDROCHLOROTHIAZIDE 12.5 MG PO TABS *I*
12.5000 mg | ORAL_TABLET | Freq: Every day | ORAL | 1 refills | Status: DC
Start: 2022-10-25 — End: 2022-12-17

## 2022-10-25 NOTE — Telephone Encounter (Signed)
[facility] Refill Note  10/25/22  [name]  09/15/1940  MRN: [id number]    Last office visit: 10/01/2022    Last telehome visit: Visit date not found    Recent Visits  Date Type Provider Dept   10/01/22 Office Visit [name], MD [facility]   07/23/22 Office Visit Schuppert, Jill Side, MD [facility]   Showing recent visits within past 185 days in an active department and meeting all other requirements  Future Appointments  No visits were found meeting these conditions.  Showing future appointments within next 0 days in an active department and meeting all other requirements      LABORATORY DATA:  Last Renal Func was  06/26/2022: Creatinine 1.21 mg/dL (H; Ref range: 5.40 - 9.81 mg/dL); eGFR BY CREAT 45 * (!; Ref range: *); Potassium 4.2 mmol/L (Ref range: 3.3 - 5.1 mmol/L).   Last LFTs was  06/18/2013: Bilirubin,Total 0.2 (Ref range: )  06/26/2022: ALT 21 U/L (Ref range: 0 - 35 U/L); AST 28 U/L (Ref range: 0 - 35 U/L).   Last Lipid panel was 06/26/2022: Cholesterol 205 mg/dL (!; Ref range: mg/dL); HDL 70 mg/dL (H; Ref range: 40 - 60 mg/dL); LDL Calculated 191 mg/dL (Ref range: mg/dL).  Last A1C was 06/26/2022: Hemoglobin A1C 5.6 % (Ref range: %).  Last TFT was 06/26/2022: TSH 2.25 uIU/mL (Ref range: 0.27 - 4.20 uIU/mL), No results found for requested labs within last 3650 days.Marland Kitchen    Arminda Resides, LPN

## 2022-11-19 ENCOUNTER — Encounter: Payer: Self-pay | Admitting: Family Medicine

## 2022-11-19 DIAGNOSIS — H532 Diplopia: Secondary | ICD-10-CM

## 2022-11-21 NOTE — Addendum Note (Signed)
Addended by: Heloise Ochoa on: 11/21/2022 08:41 AM     Modules accepted: Orders

## 2022-11-26 ENCOUNTER — Ambulatory Visit: Payer: Medicare (Managed Care) | Attending: Ophthalmology | Admitting: Ophthalmology

## 2022-11-26 ENCOUNTER — Encounter: Payer: Self-pay | Admitting: Ophthalmology

## 2022-11-26 ENCOUNTER — Other Ambulatory Visit: Payer: Self-pay

## 2022-11-26 DIAGNOSIS — H3554 Dystrophies primarily involving the retinal pigment epithelium: Secondary | ICD-10-CM | POA: Insufficient documentation

## 2022-11-26 DIAGNOSIS — H04123 Dry eye syndrome of bilateral lacrimal glands: Secondary | ICD-10-CM | POA: Insufficient documentation

## 2022-11-26 DIAGNOSIS — H524 Presbyopia: Secondary | ICD-10-CM | POA: Insufficient documentation

## 2022-11-26 DIAGNOSIS — H52203 Unspecified astigmatism, bilateral: Secondary | ICD-10-CM | POA: Insufficient documentation

## 2022-11-26 NOTE — Progress Notes (Addendum)
 Outpatient Visit      Patient name: [name]  DOB: Mar 04, 1940       Age: 82 y.o.  MR#: [id number]    Encounter Date: 11/26/2022      Assessment/Plan:      1. 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 Dry eyes, bilateral  EDE, meibomian gland dysfunction  - Meibomian gland dysfunction can cause chronic dry eyes and inflammation. Recommend warm compress for 5-10 minutes (such as Bruder Mask) with gentle lid massage once a day to improve and prevent chronic symptoms.   - continue present management with AT as needed     3. 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 female presenting for a new patient eye exam   today, double vision.  LEE was January of 2024 per patient.     Patient reports with her current glasses she can not focus with them on.   States her distance vision was blurry as well. She noticed once in a while     she will see a flash of light within her OD.   Her eyes tend to water a lot per patient. States she does have stable   floaters, redness, swelling, itching, burning or pain. States of having   double vision with OU open and when she blinks it will go away.     Ocular meds: Refresh  tears PRN OU     Ocular Hx: Cataract surgery 2 years ago per patient.                    Laser procedure states it was done 2 - 3 months after   surgery              Last edited by Scherry Ran, OD on 11/26/2022  1:53 PM.        has a current medication list which includes the following prescription(s): generic dme, cyclobenzaprine, famotidine, hydrochlorothiazide, amlodipine, simvastatin, senna, polyethylene glycol, melatonin, calcium carb-cholecalciferol, and aspirin.     is allergic to fentanyl, oxycodone, environmental allergies, lipitor [atorvastatin], and tramadol.      Past Medical History:   Diagnosis Date    AAA (abdominal aortic aneurysm)     Chronic pain     Elevated LFTs     Unclear if this was related to statin, gallstones, alcohol consumption     Essential hypertension 06/11/2016  GERD (gastroesophageal reflux disease)     High blood pressure     HLD (hyperlipidemia)     Hx of colonic polyps     Colonscopy 08/11/10; repeat 2017 per GI     Lumbar disc herniation 2006    L5-S1    Osteoporosis     Spondylosis     Urinary retention       Past Surgical History:   Procedure Laterality Date    cataract repair Bilateral     COLONOSCOPY  2011    HYSTERECTOMY      left ovaries in place     TUBAL LIGATION          Specialty Problems    None       ROS    Positive for: Eyes  Negative for: Constitutional, Gastrointestinal, Neurological, Skin,   Genitourinary, Musculoskeletal, HENT, Endocrine, Cardiovascular,   Respiratory, Psychiatric, Allergic/Imm, Heme/Lymph  Last edited by Scherry Ran, OD on 11/26/2022  1:53 PM.         Objective:     Base Eye Exam       Visual Acuity (Snellen - Linear)         Right Left    Dist cc 20/20 20/25    Near cc J1+ OU      Correction: Glasses   Patient stated she saw a green spot when checking VA with OS.              Tonometry (iCare tonometry (ICT), 1:31 PM)         Right Left    Pressure 15 17              Pupils         Shape React APD    Right Round Brisk None    Left Round Brisk None               Visual Fields (Counting fingers)         Left Right     Full Full              Extraocular Movement         Right Left     Full Full              Neuro/Psych       Oriented x3: Yes    Mood/Affect: Normal              Dilation       Both eyes: 2.5% Phenylephrine, 1.0% Tropicamide, 0.5% Proparacaine @ 1:43 PM                  Additional Tests       Amsler         Right Left     nl spot centrally                  Slit Lamp and Fundus Exam       External Exam         Right Left    External Normal ocular adnexae, lacrimal gland & drainage, orbits Normal ocular adnexae, lacrimal gland & drainage, orbits              Slit Lamp Exam         Right Left    Lids/Lashes 1-2+ mgd Normal structure & position    Conjunctiva/Sclera Normal bulbar/palpebral, conjunctiva, sclera Normal bulbar/palpebral, conjunctiva, sclera    Cornea 2+ inf pee, 5s tbut 2+ inf  pee, 5s tbut    Anterior Chamber Clear & deep Clear & deep    Iris Normal shape, size, morphology Normal shape, size, morphology    Lens pciol s/p yag pciol s/p yag              Fundus Exam         Right Left    Vitreous pvd pvd    Disc Normal size, appearance, nerve fiber layer Normal size, appearance, nerve fiber layer    C/D Ratio 0.25 0.25    Macula Normal, flat, clear vitelliform lesion    Vessels Normal, 2/3 caliber Normal, 2/3 caliber    Periphery Flat no breaks holes or tears 360 Flat no breaks holes or tears 360                  Refraction       Wearing Rx         Sphere Cylinder Axis Add    Right [place] -1.25 105 +2.50    Left +0.50 -1.50 074 +2.50      Type: PAL              Manifest Refraction (Auto)         Sphere Cylinder Axis Dist VA Add [place]    Right -0.50 -1.00 100       Left -0.50 -0.75 070                 Manifest Refraction #2         Sphere Cylinder Axis Dist VA Add [place]    Right -0.25 -1.00 100 20/20 - +2.50 J1+    Left [place] -0.75 070 20/20 -2 +2.50 OU              Manifest Refraction Comments    When refracting patient saw a blue light  that she noticed in the middle of the letters with her OS.              Cycloplegic Refraction         Sphere Cylinder Axis Dist VA Add [place]    Right [place] -1.25 100 20/20 - +2.50 J1+    Left [place] -0.75 070 20/20 -2 +2.50 OU              Final Rx         Sphere Cylinder Axis Add    Right [place] -1.25 100 +2.75    Left [place] -0.75 070 +2.75      Expiration Date: 11/25/2024              Final Rx #2         Sphere Cylinder Axis Add    Right +2.50 -1.25 100     Left +2.50 -0.75 070       Type: SVL-Reading    Expiration Date: 11/25/2024                  Final Rx         Sphere Cylinder Axis Add    Right [place] -1.25 100 +2.75    Left [place] -0.75 070 +2.75      Expiration Date: 11/25/2024          Final Rx #2         Sphere Cylinder Axis Add    Right +2.50 -1.25 100     Left +2.50 -0.75 070       Type: SVL-Reading    Expiration  Date: 11/25/2024                  OCT, mac-OU          Right Eye  Testing reliability good.     Left Eye  Testing reliability good.     Notes  Images stored in [facility]is  OD nl  OS foveal vitellliform lesion (-) irf/srf

## 2022-11-26 NOTE — Patient Instructions (Signed)
-   Meibomian gland dysfunction can cause chronic dry eyes and inflammation. Recommend warm compress for 5-10 minutes (such as Bruder Mask) with gentle lid massage once a day to improve and prevent chronic symptoms.

## 2022-12-03 ENCOUNTER — Telehealth: Payer: Self-pay | Admitting: Family Medicine

## 2022-12-03 DIAGNOSIS — K5900 Constipation, unspecified: Secondary | ICD-10-CM

## 2022-12-03 NOTE — H&P (Unsigned)
Dear Dr.Schuppert,     Thank you for this request of surgical consultation. As you know, [name] is a pleasant 82 y.o. female who presents to the office today with concerns of chronic constipation.She has a PMH of chronic back pain, HTN, GERD, arthritis, HLD. She was seen by GI Dr. Teresa Pelton. [name] has not followed up with testing due to inability to travel for testing.     [name] states she has a anal hemorrhoid that moves in and out of her anus. She states she has a BM that are long snake like BM's on a daily basis and she doesn't feel like she is fully empty. She has to wipe multiple times to get clean. She at times notices blood with wiping. She states she has significant flatus that causes significant cramping and increased burping. She currently taking one capful of Miralax a day and if she doesn't feel complete she will take senna once a month. She feels like she is sitting on her bladder- and has been having increased incontinence. Denies any nausea or emesis. Denies any fevers but will have intermittent chills. Denies any CP or SOB. She had a KUB in 08/2021 showing increased stool burden, She was advised a bowel clean out by her PCP with improvement in her symptoms.     Colonoscopy 10/31/2017  Findings:  Normal perineum and anus on DRE without fissure/fistula/stricture/hemorrhoids or tags. Scope passed to cecum. Well prepped. Full view of distended caput cecum including medial wall from valve to AO was obtained, and photodocumented. No polyps or cancer. Mild diverticulosis throughout. Small internal hemorrhoids, retroflexed view of the rectum otherwise within normal limits..     Impression(s):     Normal colonoscopy to the cecum without any mucosal irregularities, polyps or masses  Mild pan diverticulosis  Small internal hemorrhoids.    Obstetric History: 2 live births- vaginal deliveries with 1 episiotomy and 3 miscarriages      Allergies:  She is allergic to fentanyl, oxycodone, environmental allergies, lipitor  [atorvastatin], and tramadol.    Medications:  She has a current medication list which includes the following prescription(s): hydrochlorothiazide, amlodipine, simvastatin, famotidine, senna, polyethylene glycol, calcium carb-cholecalciferol, aspirin, and melatonin.    Past Medical History:  [name]  has a past medical history of AAA (abdominal aortic aneurysm), Chronic pain, Elevated LFTs, Essential hypertension (06/11/2016), GERD (gastroesophageal reflux disease), High blood pressure, HLD (hyperlipidemia), colonic polyps, Lumbar disc herniation (2006), Osteoporosis, Spondylosis, and Urinary retention.     Past Surgical History:  She  has a past surgical history that includes Tubal ligation; Hysterectomy; Colonoscopy (2011); and cataract repair (Bilateral).    Family History:  Her family history includes Breast cancer (age of onset: 31) in her sister; Cancer in her brother, mother, and sister; Dementia (age of onset: 88) in her mother; Diabetes in her brother; Heart Disease (age of onset: 69) in her father; Heart attack in her father; High cholesterol in her brother, father, mother, and sister; Hip fracture in her sister; Parkinsonism in her father; Stroke in her mother.    Social History:  She  reports that she quit smoking about 9 years ago. Her smoking use included cigarettes. She has a 30 pack-year smoking history. She has quit using smokeless tobacco. She reports current alcohol use of about 7.0 standard drinks of alcohol per week. She reports that she does not use drugs.    Review of Systems:  Significant findings in HPI. Remainder of ten system ROS is negative    Physical  Exam:  Her height is 1.651 m (5\' 5" ) and weight is 64.4 kg (142 lb). Her temporal temperature is 36.4 C (97.5 F). Her blood pressure is 158/61 and her pulse is 91. Her respiration is 16 and oxygen saturation is 99%.     General: Well-appearing in no acute distress  HEENT: Sclerae anicteric, extraocular muscles are intact, mucous  membranes are moist  Neck: Supple without lymphadenopathy or JVD  Cardio: Regular rate and rhythm, no murmurs, rubs, or gallops  Chest: Clear to auscultation bilaterally  Abdomen: Soft, nontender, nondistended  Extremities: Warm bilaterally without edema, cyanosis, or clubbing  Neuro: Alert oriented x 3, cranial nerves II through XII grossly intact    PERIANAL EXAM:The patient was placed in the left side lying position. External anus and surrounding tissues examined. No rashes, excoriations, condyloma, fissures or external hemorrhoids noted. Leakage of loose stool present. Patulous anus.     DRE:  External Sphincter tone absent Squeeze pressure weak. Rectal vault without masses.    ANOSCOPY: Verbal consent was obtained prior to procedure. A chaperone was present for the exam.  The anoscope was inserted and anal canal, anal mucosa healthy  upon withdrawal the scope the patient has  right anterior and left lateral grade 3 internal hemorrhoid.    Assessment and Plan:    In assessment, [name] is a pleasant 82 y.o. female who presents to the office today with concerns of chronic constipation and difficulty with defecation.  On exam she has a very weak pelvic floor.  Abdomen slightly distended.  We discussed in great detail that we are a surgical practice. At this time I do not foresee any acute surgical interventions as she has not failed medical management. I would optimize her stool consistency and proper toilet positioning may help ease the symptoms. Add a psyllium based fiber supplement, increase high fiber foods (goal of 30-35 grams of fiber daily), increase water intake to at least 64 oz per day. Continue Miralax daily. Consider using a Squatty Potty for ideal body positioning with bowel movements. Will consider sitz marker, defecography if appropriate.       Start taking psyllium fiber Metamucil goal is 10 grams start with 3 grams and gradually work your way up weekly.   Make sure you are staying hydrated with 64  oz of fluid non caffeine daily  Avoid straining or sitting on toilet for greater than 5 min.   Utilize Squatty potty   Bidet ([employer] - SAMODRA Bidet Attachment, Visual merchandiser)  Continue on capful of Miralax a day     If conservative management does not work we can move forward with testing- sitz marker and defecography.         Once again, thank you Dr.Schuppert for this request of surgical consultation.  Please feel free to contact our office with any questions or concerns.      With best personal regards,    Evelina Bucy, NP

## 2022-12-03 NOTE — Telephone Encounter (Signed)
VM left for patient, notified of referral being sent, encouraged to call back if she has any questions.

## 2022-12-03 NOTE — Telephone Encounter (Signed)
Patient would like a new referral to be placed with Dr Jerrel Ivory [facility]. Patient spoke with the office this morning and they are able to take her on as a new patient.    She is currently being seen by Dr. Quincy Simmonds.     Office phone number [phone]    Patient would like an update as to outcome of referral    Please advise

## 2022-12-04 ENCOUNTER — Ambulatory Visit: Payer: Medicare (Managed Care) | Attending: Colorectal Surgery | Admitting: Nurse Practitioner

## 2022-12-04 ENCOUNTER — Other Ambulatory Visit: Payer: Self-pay

## 2022-12-04 ENCOUNTER — Encounter: Payer: Self-pay | Admitting: Nurse Practitioner

## 2022-12-04 VITALS — BP 158/61 | HR 91 | Temp 97.5°F | Resp 16 | Ht 65.0 in | Wt 142.0 lb

## 2022-12-04 DIAGNOSIS — M6289 Other specified disorders of muscle: Secondary | ICD-10-CM

## 2022-12-04 DIAGNOSIS — R159 Full incontinence of feces: Secondary | ICD-10-CM

## 2022-12-04 DIAGNOSIS — R32 Unspecified urinary incontinence: Secondary | ICD-10-CM

## 2022-12-04 DIAGNOSIS — K921 Melena: Secondary | ICD-10-CM

## 2022-12-04 DIAGNOSIS — K5900 Constipation, unspecified: Secondary | ICD-10-CM

## 2022-12-04 NOTE — Patient Instructions (Signed)
Start taking psyllium fiber Metamucil goal is 10 grams start with 3 grams and gradually work your way up weekly.   Make sure you are staying hydrated with 64 oz of fluid non caffeine daily  Avoid straining or sitting on toilet for greater than 5 min.   Utilize Squatty potty   Bidet ([employer] - SAMODRA Bidet Attachment, Visual merchandiser)  Continue on capful of Miralax a day     When you start this fiber please add in gradually to offset side effects of bloating and increased gas. Start with a small portion approximately 3 grams (look at serving size on container) and stay at the 3 grams/day for 1 week then the next week go up to 5 grams for a week then 7 grams for a week then 10grams which will be your goal. Once you start fiber you should never stop- fiber is life long. This is supplemental fiber, you should also try to increase your dietary fiber- fiber from foods to 20-25grams/day as well. The supplemental fiber typically should be taken in the morning to ensure you are drinking enough fluids ideally 64 ounces of non caffeinated beverages throughout the day in order to not cause constipation.     Colorectal Surgery is a surgical practice. This means we can work up the problem to determine if surgery would be appropriate for you, and which surgery would help you. If you are not interested at all in surgery, you can follow up with either GI or your PCP's office. Surgeries range from same day procedures to major abdominal surgery, bowel resection, ostomy surgery. In order to determine if surgery will be helpful, you will be recommended for certain tests. The results of the testing will determine what surgery will help you with your problem. Again, if you are not sure you want surgery, we have a basic plan for conservative management you can try, which typically includes fiber supplements, increased water intake, miralax usage, and pelvic floor physical therapy. Ultimately, if it is determined that  you do not want surgery or surgery will not help your problem, you will be asked to follow up with GI or your PCP.    There are two main types of constipation. Slow transit constipation and outlet obstruction constipation. Some people actually have both problems. The test for slow transit constipation is a sitz marker study. For outlet obstruction constipation, the tests required are manometry and defecography. Depending on your symptoms, you may be asked to do all three of these tests to determine if and which surgery you would benefit from. In some cases, we recommend an updated colonoscopy, and sometimes further testing into your small bowel and stomach motility. We need a full picture of what is causing your problem to be able to recommend a surgery that will help with this.    Keep in mind that this problem is not dangerous, but can have an impact on your quality of life. So the extent of testing and treatment should align with your beliefs and goals, and how much you are impacted by this problem. If you are not ready for testing or surgery now, you can also come back at a later time to discuss.    If we proceed with testing then this is what we will do:    Patient Information on Defecography, Sitz Marker Study    You may be undergoing one or more of the following.  If so, here is a brief summary of the tests  you will be having.    What is a Defecography?    Defecography, or evacuation proctography, is an X-Ray test that shows the rectum and anal  canal as they change during defecation (having a bowel movement). This test is used to evaluate  for disorders of the lower bowel that are not evident by tests such as colonoscopy or  sigmoidoscopy.    The Defecography Test  Defecography is a technique in which a barium contrast medium is introduced into your  rectum after the radiologist performs a rectal examination. The barium is visible within the  rectum on X-Rays. During the test, you are instructed to defecate  (empty the rectum) on a  commode while X-rays of the pelvis are taken. These X-Rays are taken while the person is  sitting at rest, straining, squeezing, and during defecation. This test allows the doctor to evaluate  the pelvic floor muscles and rectum during defecation. This type of test, although awkward,  provides valuable information that may aid your doctor in diagnosing your problem.  Occasionally X-Rays of other areas are needed for the test. You may be asked to drink 2  cups of barium and 2 cups of water. This is done to show the small intestine in the pelvis if there  is a problem with pelvic floor relaxation. Rarely, the radiologist will also fill the vagina in  women with barium contrast.    When is Defecography used?  Defecation (having a bowel movement) is a complex action requiring coordination with  relaxation and contraction of a large number of muscles. It is controlled by the nervous system,  but is also under voluntary control. The process of defecation is initiated by the arrival of stool  into the rectum. This sensation leads to a chain of events which ends in evacuation of stool from  the anus. The act of defecation is voluntarily controlled in healthy, normally functioning people.  The following is a list of some conditions for which defecography can be used to gather  more information about a patient's condition and/or confirm a diagnosis.   - Chronic Constipation - evaluating for functional obstruction   - Rectal prolapse   - Rectocele (an outpouching of the rectum)   - Fecal incontinence   - Anismus (inappropriate spasm of the anal sphincter)      Sitz Marker Test  A sitz marker test uses tiny radio-opaque 'markers' to test how fast food is moving through the intestines. It is most often used with patients who are suffering from chronic constipation.  A sitz marker study is most often used with patients who are suffering from chronic constipation, for example less than two bowel movements  per week. During the test tiny "markers" are used to see how fast food is moving through the intestines.    You will need to come to radiology department to pick up a capsule which contains 24 Sitz markers to swallow. This capsule contains small markers which will show up on x-rays and let the doctors track their progress through your intestines.    You will need to schedule for an abdominal X-ray in 5 days. X-rays will be taken of your abdomen to see if any of the markers are left in your colon or whether they have all been expelled from the body. The more markers you have left in your body, the slower your colon motility. In general, if you are found to have very slow bowel motility (ie: most of the sitz  markers have remained in your body) then you may have slow transit constipation.

## 2022-12-17 ENCOUNTER — Other Ambulatory Visit: Payer: Self-pay | Admitting: Family Medicine

## 2022-12-17 ENCOUNTER — Encounter: Payer: Self-pay | Admitting: Family Medicine

## 2022-12-17 ENCOUNTER — Other Ambulatory Visit: Payer: Self-pay

## 2022-12-17 DIAGNOSIS — I10 Essential (primary) hypertension: Secondary | ICD-10-CM

## 2022-12-17 DIAGNOSIS — K219 Gastro-esophageal reflux disease without esophagitis: Secondary | ICD-10-CM

## 2022-12-17 MED ORDER — HYDROCHLOROTHIAZIDE 12.5 MG PO TABS *I*
12.5000 mg | ORAL_TABLET | Freq: Every day | ORAL | 1 refills | Status: DC
Start: 2022-12-17 — End: 2023-11-02

## 2022-12-17 MED ORDER — FAMOTIDINE 20 MG PO TABS *I*
20.0000 mg | ORAL_TABLET | Freq: Two times a day (BID) | ORAL | 5 refills | Status: DC | PRN
Start: 2022-12-17 — End: 2023-05-27

## 2022-12-17 NOTE — Telephone Encounter (Signed)
[facility] Refill Note  12/17/22  [name]  02/19/1940  MRN: [id number]    Last office visit: 10/01/2022    Last telehome visit: Visit date not found    Recent Visits  Date Type Provider Dept   10/01/22 Office Visit [name], MD [facility]   07/23/22 Office Visit Schuppert, Jill Side, MD [facility]   Showing recent visits within past 185 days in an active department and meeting all other requirements  Future Appointments  No visits were found meeting these conditions.  Showing future appointments within next 0 days in an active department and meeting all other requirements      LABORATORY DATA:  Last Renal Func was  06/26/2022: Creatinine 1.21 mg/dL (H; Ref range: 0.27 - 2.53 mg/dL); eGFR BY CREAT 45 * (!; Ref range: *); Potassium 4.2 mmol/L (Ref range: 3.3 - 5.1 mmol/L).   Last LFTs was  06/18/2013: Bilirubin,Total 0.2 (Ref range: )  06/26/2022: ALT 21 U/L (Ref range: 0 - 35 U/L); AST 28 U/L (Ref range: 0 - 35 U/L).   Last Lipid panel was 06/26/2022: Cholesterol 205 mg/dL (!; Ref range: mg/dL); HDL 70 mg/dL (H; Ref range: 40 - 60 mg/dL); LDL Calculated 664 mg/dL (Ref range: mg/dL).  Last A1C was 06/26/2022: Hemoglobin A1C 5.6 % (Ref range: %).  Last TFT was 06/26/2022: TSH 2.25 uIU/mL (Ref range: 0.27 - 4.20 uIU/mL), No results found for requested labs within last 3650 days.[name]

## 2022-12-17 NOTE — Telephone Encounter (Signed)
Patient had double vision back in Sept as well  I replied back that she should call for an appt for evaluation for her neck stiffness since it is ongoing

## 2022-12-18 ENCOUNTER — Encounter: Payer: Self-pay | Admitting: Family Medicine

## 2022-12-18 ENCOUNTER — Other Ambulatory Visit: Payer: Self-pay

## 2022-12-18 ENCOUNTER — Ambulatory Visit: Payer: Medicare (Managed Care) | Admitting: Family Medicine

## 2022-12-18 VITALS — BP 132/80 | HR 77 | Temp 97.6°F | Resp 16 | Wt 141.9 lb

## 2022-12-18 DIAGNOSIS — K5900 Constipation, unspecified: Secondary | ICD-10-CM

## 2022-12-18 DIAGNOSIS — I10 Essential (primary) hypertension: Secondary | ICD-10-CM

## 2022-12-18 DIAGNOSIS — K219 Gastro-esophageal reflux disease without esophagitis: Secondary | ICD-10-CM

## 2022-12-18 DIAGNOSIS — S161XXA Strain of muscle, fascia and tendon at neck level, initial encounter: Secondary | ICD-10-CM

## 2022-12-18 DIAGNOSIS — M48061 Spinal stenosis, lumbar region without neurogenic claudication: Secondary | ICD-10-CM

## 2022-12-18 DIAGNOSIS — M810 Age-related osteoporosis without current pathological fracture: Secondary | ICD-10-CM

## 2022-12-18 MED ORDER — CYCLOBENZAPRINE HCL 10 MG PO TABS *I*
10.0000 mg | ORAL_TABLET | Freq: Two times a day (BID) | ORAL | 2 refills | Status: DC | PRN
Start: 2022-12-18 — End: 2023-04-01

## 2022-12-18 MED ORDER — GENERIC DME *A*
0 refills | Status: DC
Start: 2022-12-18 — End: 2023-04-01

## 2022-12-18 NOTE — Progress Notes (Unsigned)
Primary Care - Care Management  New Referral     Date Received  12/18/22   Received By  Ria Comment, RN   Referred By  PCP   Initial Request Details  Transportation, ADL assistance       Chart Review / Case Review:      Patient seen in office today with PCP.  Patient states she has transportation needs as well as a need for assistance in her home   With general housekeeping, etc.     Patient has [employer] coverage.     She resides in [facility].     No SW outreach noted.      Assessment / Actions Taken:     CM performed outreach to patient to discuss above.   Patient stated "is this about the physical therapy", CM advised that was not purpose of   CM outreach and requested more information.   Patient stated "She sent me to PT in [place] and that is not where I want to go"   Patient states "Dr. Amado Coe sent me to PT and I want to go back there" CM searched patient chart and   Found 09/2019 PT referral to [facility], patient requested location, CM unable to determine.  Patient unable to provide name or location of previous/requested PT stating "she should be able to look it up"  CM advised that new PT request would be entered for [facility].    CM inquired on patient transportation needs and ADL needs.   Patient states "I need someone to take me to [facility] for my appointment" and that "I need someone   To take the laundry upstairs and empty the dishwasher".  CM discussed Research scientist (physical sciences) and offered to place referral. Patient declined stating  "I'm going to be busy for the next few weeks, I will take their number and call them". Contact information   Provided to patient.     Patient inquired on where she could obtain walker in [place]. CM provided patient with [facility]  Contact information to inquire if they carry these items, patient thanked CM.     Goals:    Goal: Patient will secure medical transportation by 03/01/23  Goal Initiated By: CM  Date Goal First  Initiated: 12/19/22  Goal Status: New  Status Details: In process    Resources Provided:     Clinical biochemist [employer]    No Referrals placed at this time    Social Determinants of Health    Food Insecurity: Food Insecurity Present (11/22/2022)    Hunger Vital Sign     Worried About Running Out of Food in the Last Year: Never true     Ran Out of Food in the Last Year: Sometimes true    Date of last entry: 11/22/2022    Housing Stability: Unknown (11/22/2022)    Housing Stability Vital Sign     Unable to Pay for Housing in the Last Year: No     Number of Times Moved in the Last Year: 0     Homeless in the Last Year: Not on file    Date of last entry: 11/22/2022   Utilities: Not At Risk (11/22/2022)    [employer] Utilities     Threatened with loss of utilities: No    Date of last entry: 11/22/2022   Transportation Needs: Unmet Transportation Needs (11/22/2022)    PRAPARE - Therapist, art (Medical):  Yes     Lack of Transportation (Non-Medical): No    Date of last entry: 11/22/2022            No data to display                   No data to display                  Next Steps / Plan:     Follow up in four weeks  New PT referral to provider for review      Referral Status Open

## 2022-12-18 NOTE — Progress Notes (Signed)
 [facility]     Subjective     [name] is a 82 y.o. female who presents for neck stiffness (For 3 weeks, has improved but still hurts)  History of Present Illness  The patient is an 82 year old female presenting with a complaint of neck pain.     She has been experiencing neck pain for approximately 3.5 weeks, which has limited her range of motion in all directions. The onset of her neck pain was sudden, occurring upon waking one morning. She reports no new activities or incidents that could have triggered the pain. She also reports an intermittent headache located at the back of her head, on the left side, extending behind her shoulder and into the upper back area. This started with the neck pain.     Symptoms are gradually improving. She has been managing her pain with Tylenol, taking two 500 mg tablets in the morning and two at night before bed. She has been using a heating pad while sitting in a recliner throughout the day due to severe lower back pain but has not used the heating pad for the past two days. She expresses interest in physical therapy for her neck.    She has been experiencing lower back pain, which she describes as constant and severe. This is a chronic issue related to her lumbar spinal stenosis. She is considering resuming treatment with Dr. Barnet Glasgow for cortisone injections. She reports that her pain can be so severe at times that it brings her to tears. She also reports difficulty performing household tasks such as unloading the dishwasher. She is unsure if her pain is accompanied by muscle tension or spasms but notes that the pain subsides after sitting for five minutes. She does not recall taking Flexeril or Zanaflex in the past but per chart review both of these have been tried previously.     She has been experiencing gastrointestinal issues, including excessive gas and stomach growling. She is currently taking psyllium, which have resulted in daily bowel movements. She  has discontinued famotidine and is considering resuming it to manage her burping. She has been off famotidine for one to two months. She has lost approximately six to seven pounds by increasing her fiber intake to 20 to 25 grams in the morning which she is pleased by.     She lives alone and is seeking assistance with mobility and household tasks. She is interested in obtaining a walker and home health services. She is also seeking transportation assistance. She reports that it takes her approximately one hour to change the sheets on her bed, as she needs to rest every four to five minutes due to pain.       Objective   Blood pressure 132/80, pulse 77, temperature 36.4 C (97.6 F), temperature source Temporal, resp. rate 16, weight 64.4 kg (141 lb 14.4 oz), SpO2 96%.  Physical Exam  General appearance: Well appearing, in NAD.  Cardiovascular:  Warm and well perfused.  Pulmonary:  Non-labored breathing.  Skin: Warm and dry.  Neuro:  Alert and oriented. Responding appropriately in conversation.       Results            Assessment & Plan  1. Strain of neck muscle, initial encounter (Primary)  Symptoms c/w neck strain. Recommend home stretches, exercises - provided patient with hand out today.   Trial flexeril.   Referred to PT as well.   - AMB REFERRAL TO [facility] [place]  [place]  - cyclobenzaprine (FLEXERIL) 10 mg tablet; Take 1 tablet (10 mg total) by mouth 2 times daily as needed for Muscle spasms.  Dispense: 60 tablet; Refill: 2    2. Lumbar spinal stenosis  Chronic condition, not at goal   Folding walker prescription provided to patient today, advised to take to a medical supply store   She is considering reconnecting with pain medicine specialist Dr. Barnet Glasgow which I think would be reasonable  PT may also help, referred for this as well today   - generic DME; Item to dispense: folding walker.  Instructions for use: use to assist ambulation.  Dispense: 1 each; Refill: 0  - AMB REFERRAL TO  PHYSICAL / [facility] [place] [facility]    3. Essential hypertension  BP is at goal of <140/90. Potassium was last checked on 06/26/2022 and was 4.48mmol/L, which is within acceptable limits. Creatinine was last checked on 06/26/2022 and was 1.21mg /dL, which  is stable .   Medication regimen as of TODAY is as follows:   Antihypertensive Medications               hydroCHLOROthiazide 12.5 MG tablet (Taking) Take 1 tablet (12.5 mg total) by mouth daily.    amLODIPine (NORVASC) 5 mg tablet (Taking) Take 1 tablet (5 mg total) by mouth daily.          4. GERD (gastroesophageal reflux disease)  Restart famotidine 20 mg BID  Reassess symptoms next appt, sooner prn     5. Constipation  Chronic issue, currently managed with adequate fiber inake                     Author: [name], MD  Note signed: 12/18/2022

## 2022-12-19 ENCOUNTER — Other Ambulatory Visit: Payer: Self-pay

## 2022-12-19 DIAGNOSIS — M545 Low back pain, unspecified: Secondary | ICD-10-CM

## 2022-12-19 NOTE — Telephone Encounter (Signed)
Patient would like physical therapy referral switched to [facility] instead of [address]. Please advise.

## 2022-12-19 NOTE — Progress Notes (Signed)
erroneous

## 2023-01-08 ENCOUNTER — Other Ambulatory Visit: Payer: Self-pay

## 2023-01-08 NOTE — Progress Notes (Signed)
Primary Care - Care Management  Case Updates     Assessment / Actions Taken:     CM performed outreach to patient to inquire on status of transportation/ADL needs.  No answer, LM with CM CB.    Goals not discussed at this time    No Resources provided at this time    No Referrals placed at this time    Social Determinants of Health not discussed at this time     Next Steps / Plan:     Await return call      Referral Status Open

## 2023-01-08 NOTE — Progress Notes (Signed)
Primary Care - Care Management  Case Updates     Assessment / Actions Taken:     Patient returned CM call stating she has not yet contacted Industrial/product designer [employer] for   Her transportation/ADL needs as she has been busy.     Goals:    Patient will secure medical transportation by 03/01/23   Goal Status: No Progress Made Towards Goal  Status Details: Pending    No Resources provided at this time    No Referrals placed at this time    Social Determinants of Health not discussed at this time     Next Steps / Plan:     Continue to monitor      Referral Status Open

## 2023-01-29 ENCOUNTER — Other Ambulatory Visit: Payer: Self-pay

## 2023-01-29 NOTE — Progress Notes (Signed)
 Primary Care - Care Management  Case Updates     Assessment / Actions Taken:     CM performed outreach to patient to inquire on the status of her transportation and   Outreach to senior connections.  No answer, LM with CM CB.    Goals not discussed at this time    No Resources provided at this time    No Referrals placed at this time    Social Determinants of Health not discussed at this time     Next Steps / Plan:     2nd outreach attempt      Referral Status Open

## 2023-01-30 ENCOUNTER — Ambulatory Visit: Payer: Medicare (Managed Care)

## 2023-01-30 ENCOUNTER — Telehealth: Payer: Self-pay | Admitting: Colorectal Surgery

## 2023-01-30 ENCOUNTER — Other Ambulatory Visit: Payer: Self-pay

## 2023-01-30 NOTE — Progress Notes (Addendum)
 Primary Care - Care Management  Case Updates     Assessment / Actions Taken:     Patient returned CM call. CM inquired on status of outreach to [employer] for Transportation/ADL assistance.   Patient states she has not and "I'm not ready for that".   Patient states she is feeling better and has a new GI provider who has helped her relieve her symptoms.   CM advised patient to reach out with any future needs/concerns. Patient agrees.     Goals:    Patient will secure medical transportation by 03/01/23   Goal Status: No Progress Made Towards Goal  Status Details: Patient no longer pursuing    No Resources provided at this time    No Referrals placed at this time    Social Determinants of Health not discussed at this time     Next Steps / Plan:     Remain available as needed  Msg to PCP as FYI      Referral Status Closed

## 2023-01-30 NOTE — Telephone Encounter (Signed)
 Pt. Called and would like to know how long she is to continue the metamucil/ miralax regimen. Pt. York Spaniel it is working well so far.

## 2023-01-30 NOTE — Telephone Encounter (Signed)
 Attempted to call Hilda Lias. LM to continue metamucil daily. She can adjust dosing of miralax as needed.

## 2023-01-31 ENCOUNTER — Other Ambulatory Visit: Payer: Self-pay

## 2023-01-31 NOTE — Progress Notes (Signed)
 Erroneous

## 2023-02-21 ENCOUNTER — Other Ambulatory Visit: Payer: Self-pay

## 2023-02-21 IMAGING — MR NEURO [HOSPITAL]^CRANIO
9 series · 48 of 48 positions shown · non-contrast
Comparison: none

[Series 2: T1 · sagittal · 4.5mm · 0.94mm/px · 4 of 30 slices shown (1 of 2)]
[im 1/30]
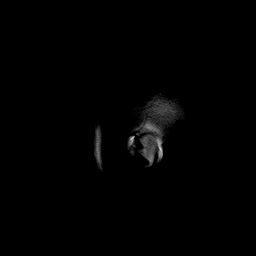
[im 10/30]
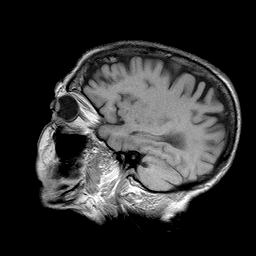
[im 20/30]
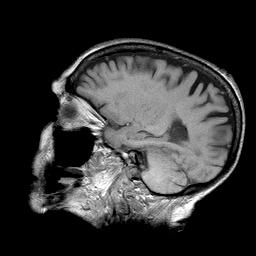
[im 30/30]
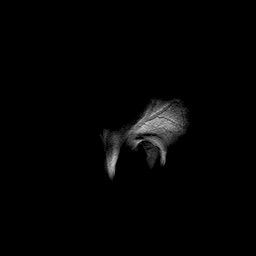

[Series 3: T2 · sagittal · 4.5mm · 0.94mm/px · 5 of 30 slices shown (1 of 4)]
[im 1/30]
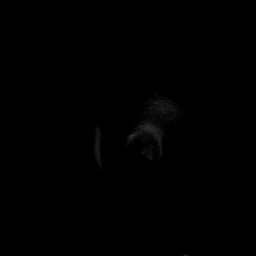
[im 8/30]
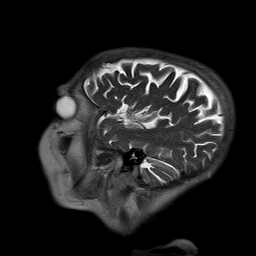
[im 15/30]
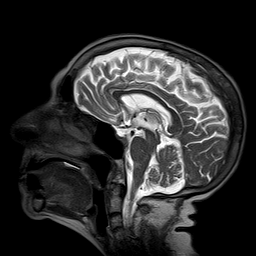
[im 22/30]
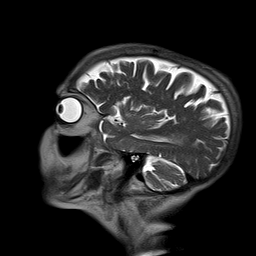
[im 30/30]
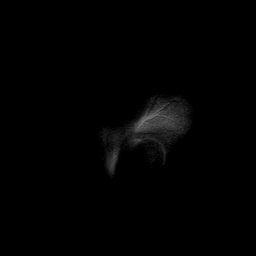

[Series 4: T2 · axial · 4.5mm · 0.94mm/px · z∈[-57,+80]mm · 5 of 26 slices shown (2 of 4)]
[im 1/26]
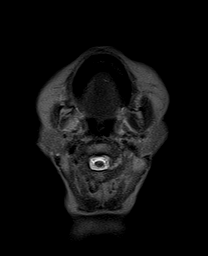
[im 7/26]
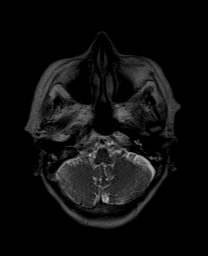
[im 13/26]
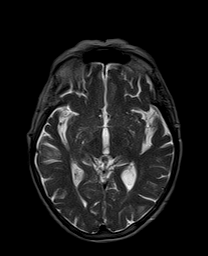
[im 19/26]
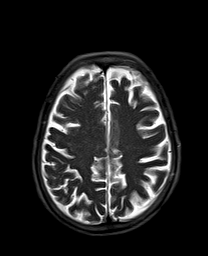
[im 26/26]
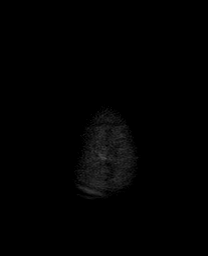

[Series 5: FLAIR · axial · 4.5mm · 0.90mm/px · z∈[-59,+77]mm · 5 of 26 slices shown]
[im 1/26]
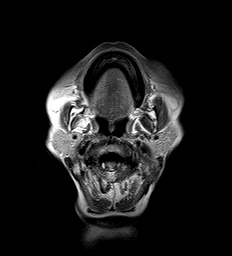
[im 7/26]
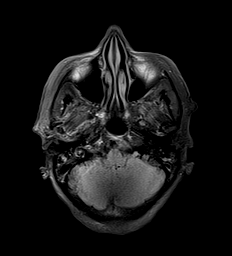
[im 13/26]
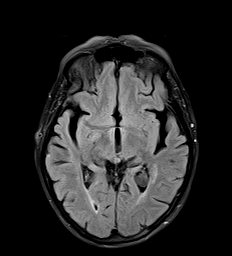
[im 19/26]
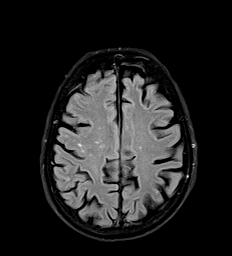
[im 26/26]
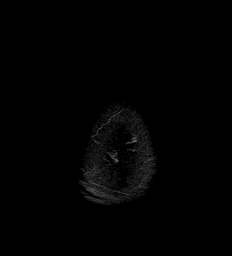

[Series 6: T2 · axial · 4.5mm · 0.45mm/px · z∈[-59,+77]mm · 5 of 26 slices shown (3 of 4)]
[im 1/26]
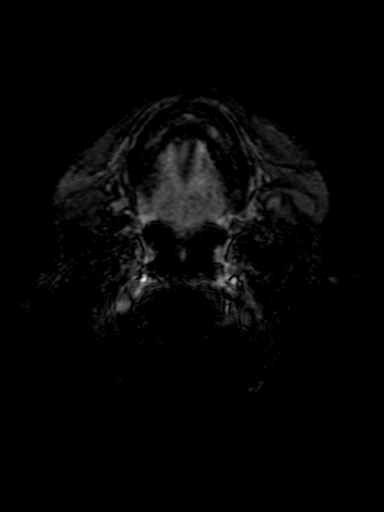
[im 7/26]
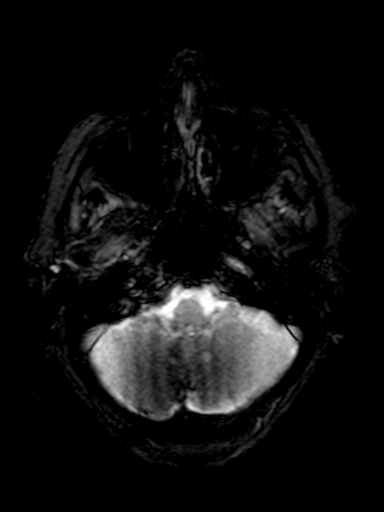
[im 13/26]
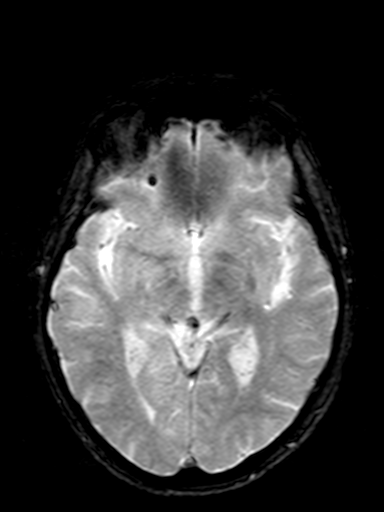
[im 19/26]
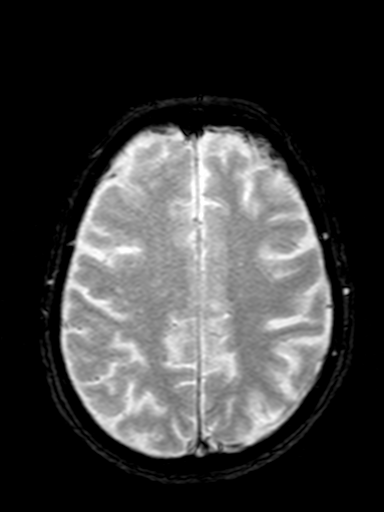
[im 26/26]
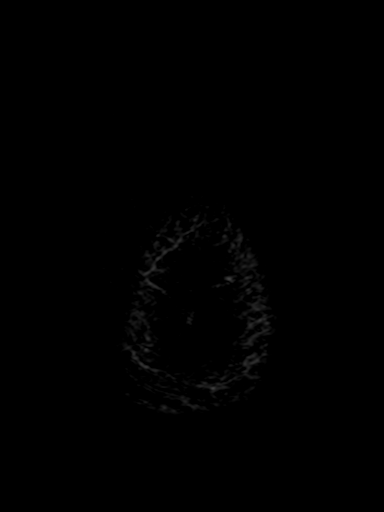

[Series 7: axial difusao_tracew · axial · 4.5mm · 1.31mm/px · z∈[-59,+77]mm · 9 of 49 slices shown]
[im 1/49]
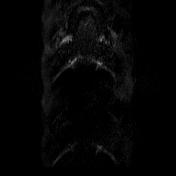
[im 7/49]
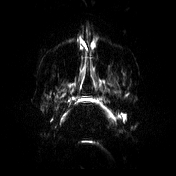
[im 13/49]
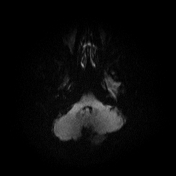
[im 19/49]
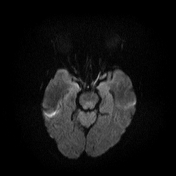
[im 25/49]
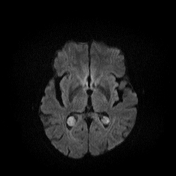
[im 31/49]
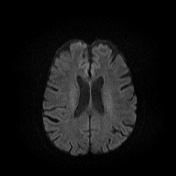
[im 37/49]
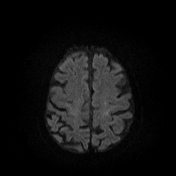
[im 43/49]
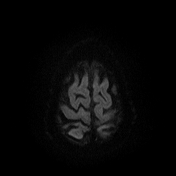
[im 49/49]
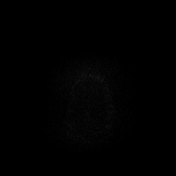

[Series 8: axial difusao_adc · axial · 4.5mm · 1.31mm/px · z∈[-59,+77]mm · 5 of 26 slices shown]
[im 1/26]
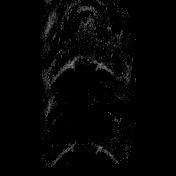
[im 7/26]
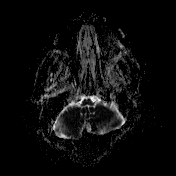
[im 13/26]
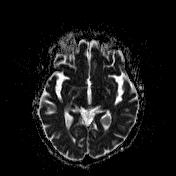
[im 19/26]
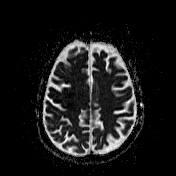
[im 26/26]
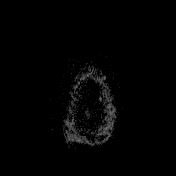

[Series 9: T2 · coronal · 4.5mm · 0.94mm/px · 5 of 30 slices shown (4 of 4)]
[im 1/30]
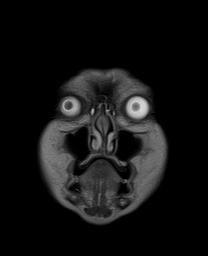
[im 8/30]
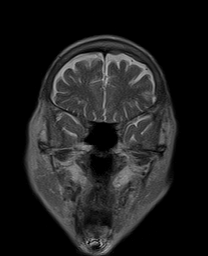
[im 15/30]
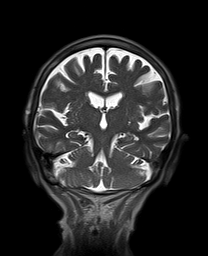
[im 22/30]
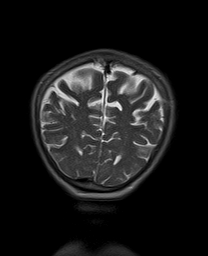
[im 30/30]
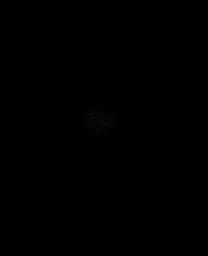

[Series 10: T1 · axial · 4.5mm · 0.94mm/px · z∈[-57,+80]mm · 5 of 26 slices shown (2 of 2)]
[im 1/26]
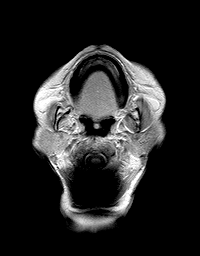
[im 7/26]
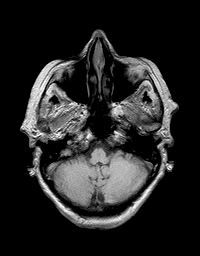
[im 13/26]
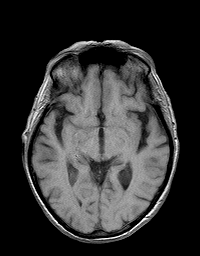
[im 19/26]
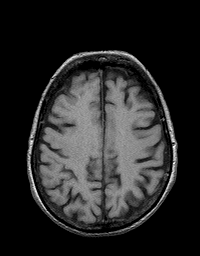
[im 26/26]
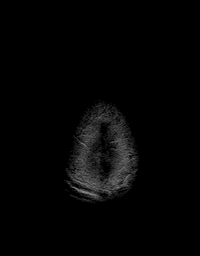

[48 of 48 positions shown; findings below may reference images not displayed]

TÉCNICA:                         
Exame realizado em aparelho de ressonância magnética de 1,5 Tesla, com aquisição de imagens
em sequências multiplanares com bobinas específicas, ponderadas em T1, T2, FLAIR, Gradiente
Eco , Difusão
DESCRIÇÃO:
Ausência de processo expansivo intra-craniano, de lesão parenquimatosa focal ou de coleção
extra-axial, acima e abaixo do tentório.
 RESSONÂNCIA MAGNÉTICA DE ENCÉFALO
Sistema  ventricular com morfologia preservada.
Acentuação dos sulcos entre os giros corticais.
Tronco cerebral e Cerebelo com aspecto morfológico normal.
Observam-se raros focos  de hipersinal em T2 e FLAIR em projeção de substância branca
periventricular e subcorticais e cápsulonuclear a direita que podem corresponder a áreas de
microleucoencefalopatia e/ou gliose.
Substância nigra e núcleos rubros aparentemente com morfologia preservada.
Ausência de restrição a difusão.
OPINIÃO:                            
Redução volumétrica do parênquima cerebral de forma simétrica e bilateral com raros focos  de
hipersinal em T2 e FLAIR em projeção de substância branca periventricular e subcorticais e
cápsulonuclear a direita que podem corresponder a áreas de microleucoencefalopatia e/ou gliose.
(microangiopatia/FAZEKAS I?).
Ausência de lesões isquêmicas agudas no presente exame.
Nuk Jam Geasy
Obs: os achados devem ser correlacionados com a clínica.
Nuk Jam Geasy

## 2023-02-22 ENCOUNTER — Ambulatory Visit: Payer: Medicare (Managed Care) | Admitting: Rehabilitative and Restorative Service Providers"

## 2023-03-05 ENCOUNTER — Other Ambulatory Visit: Payer: Self-pay

## 2023-03-06 ENCOUNTER — Telehealth: Payer: Self-pay | Admitting: Ophthalmology

## 2023-03-06 ENCOUNTER — Encounter: Payer: Self-pay | Admitting: Ophthalmology

## 2023-03-06 ENCOUNTER — Ambulatory Visit: Payer: Medicare (Managed Care) | Attending: Ophthalmology | Admitting: Ophthalmology

## 2023-03-06 DIAGNOSIS — H3554 Dystrophies primarily involving the retinal pigment epithelium: Secondary | ICD-10-CM | POA: Insufficient documentation

## 2023-03-06 DIAGNOSIS — H04123 Dry eye syndrome of bilateral lacrimal glands: Secondary | ICD-10-CM | POA: Insufficient documentation

## 2023-03-06 NOTE — Telephone Encounter (Signed)
 Left voicemail for patient needs retina eval per Dr. Imogene Burn for changing symptomatic vitelliform lesion OS for monitoring/evaluation in 3 months. Advised to call to schedule. Also mailed letter

## 2023-03-06 NOTE — Progress Notes (Addendum)
 Outpatient Visit      Patient name: [name]  DOB: [birth year]       Age: 83 y.o.  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  Scotoma seen on baseline acuity and on amsler grid 11/2022  Personally ordered and reviewed OCT MAC  Today likely enlargement of vitelliform lesion vs previous  - provided amsler grid again, instructions were reviewed, check daily and rtc stat if acute changes  - recommend evaluation with retina in 3 months     Astigmatism of both eyes with presbyopia   - Increased add on pals to improve vision - no further added due to good nearpoint vision and reduced range of clear vision   - ocular surface optimization  - prescription dispensed     Dry eyes, bilateral  EDE, meibomian gland dysfunction  Has breuder mask, uses intermittently due to rash and headaches   - Add AT in morning to improve near vision     Refer for 3 month retina eval      Subjective:     Chief Complaint   Patient presents with    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 Mac Oct. Forgot how to use amsler grid   and lost it.     Had refraction last visit but never got glasses. Pt dislikes her current   glasses and has trouble focusing with near. When she wakes up in the   morning and uses glasses on computer and near and vision is blurry.   Distance is good in afternoon without glasses.      Pt states that she has not been ill only fatigued with sinus issues.   Prefers only mild dilation today due to driving    Bruder mask was helping with the floaters but had to stop using as he   developed a rash around the eyes and had cluster headaches.  Cluster headaches occur everyday. The headaches began about a month ago   (date unknown) PCP not yet aware    Denies pain. Last week  had 3 ophthalmic migraine auras, lasting a half   hour. Denies migraines afterwards    Systane OU PRN  (-)DM    Does not do lid massages          Last edited by [name], OD on 03/06/2023 10:55 AM.        has a current medication list which includes the following prescription(s): famotidine, hydrochlorothiazide, amlodipine, simvastatin, senna, polyethylene glycol, melatonin, calcium carb-cholecalciferol, aspirin, generic dme, and cyclobenzaprine.     is allergic to fentanyl, oxycodone, environmental allergies, lipitor [atorvastatin], and tramadol.      Past Medical History:   Diagnosis Date    AAA (abdominal aortic aneurysm)     Chronic pain     Elevated LFTs     Unclear if this was related to statin, gallstones, alcohol consumption     Essential hypertension 06/11/2016    GERD (gastroesophageal reflux disease)     High blood pressure  HLD (hyperlipidemia)     Hx of colonic polyps     Colonscopy 08/11/10; repeat 2017 per GI     Lumbar disc herniation 2006    L5-S1    Osteoporosis     Spondylosis     Urinary retention       Past Surgical History:   Procedure Laterality Date    cataract repair Bilateral     COLONOSCOPY  2011    HYSTERECTOMY      left ovaries in place     TUBAL LIGATION          Specialty Problems    None       ROS    Positive for: Eyes  Negative for: Constitutional, Gastrointestinal, Neurological, Skin,   Genitourinary, Musculoskeletal, HENT, Endocrine, Cardiovascular,   Respiratory, Psychiatric, Allergic/Imm, Heme/Lymph  Last edited by [name], OD on 03/06/2023 10:39 AM.         Objective:     Base Eye Exam       Visual Acuity (Snellen - Linear)         Right Left    Dist cc 20/20 -1 20/30      Correction: Glasses              Tonometry (iCare tonometry (ICT), 10:29 AM)         Right Left    Pressure 14 17              Pupils         Shape APD    Right Round None    Left Round None              Visual Fields (Counting fingers)         Left Right     Full Full              Extraocular Movement          Right Left     Full Full              Neuro/Psych       Oriented x3: Yes    Mood/Affect: Normal              Dilation       Both eyes: 0.5% Mydriacyl @ 10:29 AM   0.5% preferred due to driving                 Additional Tests       Amsler         Right Left     Normal Normal                  Slit Lamp and Fundus Exam       External Exam         Right Left    External Normal ocular adnexae, lacrimal gland & drainage, orbits Normal ocular adnexae, lacrimal gland & drainage, orbits              Slit Lamp Exam         Right Left    Lids/Lashes 1-2+ mgd Normal structure & position    Conjunctiva/Sclera Normal bulbar/palpebral, conjunctiva, sclera Normal bulbar/palpebral, conjunctiva, sclera    Cornea 2+ inf pee, 5s tbut 2+ inf pee, 5s tbut    Anterior Chamber Clear & deep Clear & deep    Iris Normal shape, size, morphology Normal shape, size, morphology    Lens pciol s/p yag pciol s/p yag  Fundus Exam         Right Left    Vitreous pvd pvd    Disc Normal size, appearance, nerve fiber layer Normal size, appearance, nerve fiber layer    C/D Ratio 0.25 0.25    Macula Normal, flat, clear vitelliform lesion    Vessels Normal, 2/3 caliber Normal, 2/3 caliber    Periphery Flat no breaks holes or tears 360 Flat no breaks holes or tears 360                  Refraction       Wearing Rx         Sphere Cylinder Axis Add    Right [place] -1.25 109 +2.50    Left +0.50 -1.50 077 +2.50      Type: PAL                              OCT, mac-OU          Right Eye  Testing reliability good.     Left Eye  Testing reliability good.     Notes  Images stored in [employer]  [name]  OD nl  OS subfoveal vitelliform lesion, +20um CT, larger appearance vs last

## 2023-03-11 ENCOUNTER — Other Ambulatory Visit: Payer: Self-pay | Admitting: Family Medicine

## 2023-03-11 NOTE — Telephone Encounter (Signed)
 [facility] Refill Note  03/11/23  PATTON SWISHER  10/15/1940  MRN: [id number]    Last office visit: 12/18/2022    Last telehome visit: Visit date not found    Recent Visits  Date Type Provider Dept   12/18/22 Office Visit [name], MD [facility]   10/01/22 Office Visit Schuppert, Jill Side, MD [facility]   Showing recent visits within past 185 days in an active department and meeting all other requirements  Future Appointments  No visits were found meeting these conditions.  Showing future appointments within next 0 days in an active department and meeting all other requirements      LABORATORY DATA:  Last Renal Func was  06/26/2022: Creatinine 1.21 mg/dL (H; Ref range: 5.40 - 9.81 mg/dL); eGFR BY CREAT 45 * (!; Ref range: *); Potassium 4.2 mmol/L (Ref range: 3.3 - 5.1 mmol/L).   Last LFTs was  06/18/2013: Bilirubin,Total 0.2 (Ref range: )  06/26/2022: ALT 21 U/L (Ref range: 0 - 35 U/L); AST 28 U/L (Ref range: 0 - 35 U/L).   Last Lipid panel was 06/26/2022: Cholesterol 205 mg/dL (!; Ref range: mg/dL); HDL 70 mg/dL (H; Ref range: 40 - 60 mg/dL); LDL Calculated 191 mg/dL (Ref range: mg/dL).  Last A1C was 06/26/2022: Hemoglobin A1C 5.6 % (Ref range: %).  Last TFT was 06/26/2022: TSH 2.25 uIU/mL (Ref range: 0.27 - 4.20 uIU/mL), No results found for requested labs within last 3650 days.[name]

## 2023-03-19 NOTE — Progress Notes (Signed)
 Sent via: eRecord EMR INBASKET    Physician attestation for [facility] Plan of Care: Physician/NP/PA:  [name], MD   Per signature, I have reviewed and agree with the documented plan of care.    ___________________________________________________________    Please sign this Medicare plan of care for outpatient therapy treatment as required by Medicare. If you have any questions, please contact us at [phone]. We appreciate your prompt attention to this request.    Sincerely,   [facility]       [facility] ORTHO SPORTS + SPINE REHABILITATION  LUMBAR SPINE EVALUATION      03/21/2023  Diagnosis:   1. Strain of neck muscle, subsequent encounter        2. Spinal stenosis of lumbar region with neurogenic claudication          Onset date:ONSET DATE (fast track?): Chronic      SUBJECTIVE    [name] is a 83 y.o. female who is present today for lumbar, cervical  care. Relevant PMH includes: hypertension, abdominal aortic aneurysm, osteoporosis, GERD, low back pain, dependent edema. Pt reports that approximately 1 month ago, she woke up in the morning with intense left lateral cervical pain. She had difficulty with left rotation and lateral flexion. Her symptoms took about 10 days to resolve. She currently does not experience any discomfort in her neck, but she notes frequent clicking. Pt also reports experiencing chronic low back pain since the 1970s. She notes that she has received lumbar injections multiple times in the past, which has not helped to relieve her symptoms. She also experiences reduced strength of her LLE and requires assistance for transferring into a car. Pt does note that she has scoliosis and was previously diagnosed with multiple lumbar herniated discs. Pt reports that her lumbar symptoms are currently more bothersome than her cervical region. Pt denies any red flags at this time.   Mechanism of injury/history of symptoms: No specific cause    Nature of  Symptoms  Relevant symptoms: Aching, Pain , Decreased strength, Mechanical symptoms   Symptom frequency: Intermittent  Symptom intensity (0 - 10 scale): Now 4 Best 0 Worst 10  Symptom location: Lateral and Posterior bilateral low back, left hip, and right hip, posterior bilateral thighs, L>R; left lateral cervical region   Symptoms worsen with: Standing, Walking, Cervical rotations    Symptoms improve with: Rest, Heat, Sitting   Prior history of low back pain.  - YES  Assistive Device: none    Previous Testing and/or Treatment  Diagnostic tests: None   Previous or Concurrent Treatment: physical therapy  History spine of surgery:no      Medical Screen  Within the last 3 months, patient reports: fatigue/malaise, no falls / trauma  History of Cancer: No  History of Smoking: Yes, quit 11 years ago    Occupation and Activities  Work status: Retired  Therapist, nutritional of work: Retired  Chiropractor of job:  NA    Stresses/physical demands of home: Self Care, Housekeeping, Gardening/Yard Work, and Hobbies   Physical activity level: Activity at least 3x / week  Sport/Activities:  Seated elliptical, chair exercises      Patient's goals for therapy: Reduce pain    FUNCTION    Functional Status of Walking, Standing or Sitting  Patient is able to walk for <  before symptoms are produced/ increased.        OBJECTIVE    Observation  Cervcal lordosis: normal  Thoracic kyphosis: increased  Lumbar lordosis: decreased    Lateral Deviation: None    Palpation   NA    Segmental Mobility Assessment  NA    LUMBAR AROM / Movement pattern  Flexion: Reach to mid shin with normal movement, painful arc  Extension:  moderate loss with normal movement  Left Sidebend: Reach to proximal knee with normal movement  Right Sidebend: Reach to proximal knee with normal movement  Left Rotation:  significant loss with normal movement  Right Rotation:  significant loss with normal movement    Cervical AROM:  Flexion: Minor loss  Extension:  Moderate loss  Left Sidebend: Moderate loss, tightness on left side   Right Sidebend: Moderate loss  Left Rotation: Moderate loss, tightness on left side  Right Rotation: Moderate loss    Directional Preference: Flexion bias  Repeated extension had no effect on the patients symptoms  Sustained flexion abolished patient's axial symptoms            LE/UE  NA LEFT   RIGHT   Strength    PROM AROM PROM AROM Left Right   Shoulder flex  [facility]  WFL 4 4   Shoulder abd  WFL  WFL 4 4   Elbow ext     4 4   Elbow flex     4 4            Hip flex WNL  WNL  3+ pain 4-   Hip IR WNL  WNL  4 4   Hip ER WNL  WNL  4- 4-   Knee flex     4- 4   Knee ext     4 4   Ankle DF     4- 4         Neuromuscular Assessment  Myotomes:  LE Intact to gross screen    Special Tests  Lumbar:  Straight Leg Raise,  Right LE  negative, Left LE   negative  Crossed Straight Leg Raise, Right LE  negative, Left LE   negative  Sacroilliac Joint:  NA  Hip: NA        Flexibility:   Hamstrings: Moderate tightness  Gastroc-soleus: Moderate tightness    Neuromuscular Control  Isometric: Ability to brace abdomen in sitting/supine -  Abdominal Activation: yes  Drawing in abdomen:  yes  Neutral spine:  yes    Functional Mobility  Walk more than 15 minutes - unable to perform without pain.  Ascend stairs with reciprocal gait - able to perform.  Descend stairs with reciprocal gait - able to perform.                  ASSESSMENT  Findings consistent with 83 y.o. female with strain of neck muscle and lumbar spinal stenosis with pain, ROM limitations, strength limitations, functional limitations. Pt tolerated session well with only minimal increases in discomfort. She demonstrated a directional preference for lumbar flexion and we discussed the implications for rehab. She also demonstrated appropriate engagement of her deep core with verbal and manual cues provided. We discussed concepts like spinal anatomy, stenosis, the transverse abdominis, and regional interdependence. Pt  would benefit from skilled PT services to address the impairments listed above and return to their prior level of function.     Significant presentation and prognostic factors include moderate symptom rating, low disability rating, and stable clinical status.   Based on clinical evaluation and assessment, the primary rehabilitation approach will include symptom modulation, movement control, functional optimization.  Personal factors affecting treatment/recovery:  none identified  Comorbidities affecting treatment/recovery:  Cardiac history  Osteoporosis  Reflux  Clinical presentation:  stable  Patient complexity:    low level as indicated by above stability of condition, personal factors, environmental factors and comorbidities in addition to patient symptom presentation and impairments found on physical exam.    Prognosis:  Good    Contraindications/Precautions/Limitation:  Per protocol    Short Term Goals:4week(s) Decrease c/o max pain to < 4/10 and Minimal assistance with HEP/ education concepts   Long Term Goals:8week(s) Walk community distances without increased symptoms, Stand as long as needed when completing ADL's without increased symptoms, Patient demonstrates improved functional core strength, Patient will return to full prior level of function  , Independent with symptom management    TREATMENT PLAN  Patient/family involved in developing goals and treatment plan: Yes    Recommended Treatment Freq 1-2 times per week(s) for 8 week(s)    Treatment plan inclusive of:  Exercise:AROM, AAROM, PROM, Stretching, Strengthening, Progressive Resistive, Coordination, Aerobic exercise  Manual Techniques:Myofascial Release, Joint mobilization, Soft tissue release/massage   Modalities:  Aquatic therapy, Cold pack, Functional/Therapeutic activites per flowsheet, Moist heat, Ther Exercise per flowsheet  Functional: Proprioception/Dynamic stability, Postural training, Balance       Thank you for the referral of this  patient to [facility] Spine [facility].    Dessie Coma, PT        Supine cervical retraction 10x   Supine TA holds 3 x 10s   SKTC 10x   LTR 10x                                                            Treatment Start Time 10:00 am    Treatment End Time 10:45 am    Total Minutes of treatment 45       Total Non-Treatment time (rest)    Total Service Based min of treatment 30   Total Time-Based min of treatment 15           Service-Based Procedures/ Modalities    Evaluation 30   Re-evaluation    Traction, mechanical    Electric stimulation (unattended)    Vasopneumatic device    Whirlpool    Total Service Based Treatment 30       Time-Based Procedures / Modalities    Therapeutic ex 15   Manual Therapy    Therapeutic Activities    Neuromuscular Re-ed    Physical Performance Test    Gait training, including stairs    Self Care/ Home Management Training    Ultrasound    Electric stimulation (manual)    Iontophoresis    Contrast baths    Total Time-Based Treatment 15          POC DATE: 03/20/2023

## 2023-03-20 ENCOUNTER — Ambulatory Visit: Payer: Medicare (Managed Care) | Attending: Family Medicine | Admitting: Physical Therapy

## 2023-03-20 ENCOUNTER — Other Ambulatory Visit: Payer: Self-pay

## 2023-03-20 DIAGNOSIS — M48061 Spinal stenosis, lumbar region without neurogenic claudication: Secondary | ICD-10-CM

## 2023-03-20 DIAGNOSIS — S161XXD Strain of muscle, fascia and tendon at neck level, subsequent encounter: Secondary | ICD-10-CM | POA: Insufficient documentation

## 2023-03-20 DIAGNOSIS — M48062 Spinal stenosis, lumbar region with neurogenic claudication: Secondary | ICD-10-CM | POA: Insufficient documentation

## 2023-03-26 ENCOUNTER — Other Ambulatory Visit: Payer: Self-pay

## 2023-03-26 NOTE — Progress Notes (Unsigned)
 [facility] Orthopedic Sports/Spine Rehabilitation  PT Treatment Note      Name: [name]  DOB: Dec 18, 1940  Referring Physician: [name], MD  Diagnosis: No diagnosis found.      Subjective:  Pain Assessment: {PAIN SCALE:23261}  {PT/OT Subjective:23896}       Objective:  ROM -  {Location:26241}, {ROM:26242}  LUMBAR AROM / Movement pattern  Flexion: Reach to mid shin with normal movement, painful arc  Extension:  moderate loss with normal movement  Left Sidebend: Reach to proximal knee with normal movement  Right Sidebend: Reach to proximal knee with normal movement  Left Rotation:  significant loss with normal movement  Right Rotation:  significant loss with normal movement    Cervical AROM:  Flexion: Minor loss  Extension: Moderate loss  Left Sidebend: Moderate loss, tightness on left side   Right Sidebend: Moderate loss  Left Rotation: Moderate loss, tightness on left side  Right Rotation: Moderate loss  LE/UE  NA LEFT   RIGHT   Strength    PROM AROM PROM AROM Left Right   Shoulder flex  [facility]  WFL 4 4   Shoulder abd  WFL  WFL 4 4   Elbow ext     4 4   Elbow flex     4 4            Hip flex WNL  WNL  3+ pain 4-   Hip IR WNL  WNL  4 4   Hip ER WNL  WNL  4- 4-   Knee flex     4- 4   Knee ext     4 4   Ankle DF     4- 4     Strength - {Strength:26243}  Function: - {Improved/Worse/unchg:26239}  Education:  {Education:26246}    Objective        Treatment:  {Modalities:26249}  Recumbent bike warm up ---   Seated hamstring stretch  Standing calf stretch ---    Seated lumbar roll outs ---    Supine cervical retraction 10x   Supine TA holds 3 x 10s   Supine TA march ---    SKTC 10x   LTR 10x   Supine hip add squeeze ---    Supine clamshell --   Seated scapular retractions ---                                        Assessment:   ***       Plan of Care:  Continue per Plan of care -  {Plan:26262}    Thank you for referring this patient to [facility] Orthopaedics - Sports and Spine  Rehabilitation    Dessie Coma, PT              Treatment Start Time    Treatment End Time    Total Minutes of treatment        Total Non-Treatment time (rest)    Total Service Based min of treatment    Total Time-Based min of treatment            Service-Based Procedures/ Modalities    Evaluation    Re-evaluation    Traction, mechanical    Electric stimulation (unattended)    Vasopneumatic device    Whirlpool    Total Service Based Treatment        Time-Based Procedures /  Modalities    Therapeutic ex    Manual Therapy    Therapeutic Activities    Neuromuscular Re-ed    Physical Performance Test    Gait training, including stairs    Self Care/ Home Management Training    Ultrasound    Electric stimulation (manual)    Iontophoresis    Contrast baths    Total Time-Based Treatment           POC DATE: ***

## 2023-03-27 ENCOUNTER — Telehealth: Payer: Self-pay | Admitting: Ophthalmology

## 2023-03-27 ENCOUNTER — Ambulatory Visit: Payer: Medicare (Managed Care) | Admitting: Physical Therapy

## 2023-03-27 ENCOUNTER — Other Ambulatory Visit: Payer: Self-pay

## 2023-03-27 DIAGNOSIS — H3554 Dystrophies primarily involving the retinal pigment epithelium: Secondary | ICD-10-CM | POA: Insufficient documentation

## 2023-03-27 DIAGNOSIS — S161XXD Strain of muscle, fascia and tendon at neck level, subsequent encounter: Secondary | ICD-10-CM

## 2023-03-27 DIAGNOSIS — M48062 Spinal stenosis, lumbar region with neurogenic claudication: Secondary | ICD-10-CM

## 2023-03-28 ENCOUNTER — Ambulatory Visit: Payer: Medicare (Managed Care) | Attending: Retina Ophthalmology | Admitting: Retina Ophthalmology

## 2023-03-28 ENCOUNTER — Encounter: Payer: Self-pay | Admitting: Retina Ophthalmology

## 2023-03-28 DIAGNOSIS — H3554 Dystrophies primarily involving the retinal pigment epithelium: Secondary | ICD-10-CM | POA: Insufficient documentation

## 2023-03-28 NOTE — Assessment & Plan Note (Addendum)
 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 Imogene Burn and prescription dispensed   - reprinted MRX 03/28/23    Dry eyes, bilateral  - Followed by Dr. Imogene Burn, meibomian gland dysfunction  - Has breuder mask, uses intermittently due to rash and headaches   - AT in morning to improve near vision      Follow-up:  8 m oct mac and FAF ou with Dr. Adriana Reams in Retina Service for re-evaluation, sooner if vision decreases or symptoms worsen. All findings have been discussed with the patient and any family or associates present and they have expressed understanding of this discussion and have had their questions addressed.

## 2023-03-28 NOTE — Progress Notes (Signed)
 Outpatient Visit      Patient name: [name]  Medical Record Number: [id number]  DOB: Mar 22, 1940  Age: 83 y.o.     Date: 03/28/2023    Subjective:     Chief Complaint:   Chief Complaint   Patient presents with    New Patient Visit     OS subfoveal vitelliform lesion     Blurry vision  HPI     New Patient Visit     Additional comments: OS subfoveal vitelliform lesion           Comments    [name] is a 83 y.o. female here for a NPV   PT notes she was referred from doctor Imogene Burn for a increase in size of a  OS     subfoveal vitelliform lesion. Pt notes that when looking at amsler grid   with left eye and readers, she sees wavy lines. PT notes that the left eye     has been feeling heavy and seemed swollen last couple days.     Ocular meds:  Systane PRN                Last edited by Richarda Osmond on 03/28/2023  8:04 AM.        ROS    Positive for: Eyes  Last edited by Richarda Osmond on 03/28/2023  8:04 AM.      Current Medications[1]     is allergic to fentanyl, oxycodone, environmental allergies, lipitor [atorvastatin], and tramadol.      Past Medical History:   Diagnosis Date    AAA (abdominal aortic aneurysm)     Chronic pain     Elevated LFTs     Unclear if this was related to statin, gallstones, alcohol consumption     Essential hypertension 06/11/2016    GERD (gastroesophageal reflux disease)     High blood pressure     HLD (hyperlipidemia)     Hx of colonic polyps     Colonscopy 08/11/10; repeat 2017 per GI     Lumbar disc herniation 2006    L5-S1    Osteoporosis     Spondylosis     Urinary retention         Past Surgical History:   Procedure Laterality Date    cataract repair Bilateral     COLONOSCOPY  2011    HYSTERECTOMY      left ovaries in place     TUBAL LIGATION        Tobacco Use History[2]   Social History     Substance and Sexual Activity   Alcohol Use Yes    Alcohol/week: 7.0 standard drinks of alcohol    Types: 7 Glasses of wine per week    Comment: 1/day      Social  History     Substance and Sexual Activity   Drug Use No      Specialty Problems          Ophthalmology Problems    Vitelliform lesion of macula            History Reviewed and confirmed with patient. Changes noted as reviewed for encounter 03/28/2023  Family History: Reviewed and confirmed with patient. Changes noted as reviewed for encounter 03/28/2023    Review of Systems: Reviewed and confirmed with patient. Changes noted as reviewed for encounter 03/28/2023  Medications: Reviewed and confirmed with patient. Changes noted and as reviewed for encounter 03/28/2023  Allergies: Reviewed and confirmed with  patient. Changes noted and as reviewed for encounter 03/28/2023  Problem List:  Reviewed and confirmed with patient. Changes noted As reviewed for encounter 03/28/2023    Objective:     Base Eye Exam       Visual Acuity (Snellen - Linear)         Right Left    Dist cc 20/25 20/25 +1      Correction: Glasses              Tonometry (Tonopen, 8:09 AM)         Right Left    Pressure 20 19              Pupils         Dark Light Shape React APD    Right 4 3 Round Brisk None    Left 4 3 Round Brisk None              Neuro/Psych       Oriented x3: Yes    Mood/Affect: Normal              Dilation       Both eyes: 2.5% Phenylephrine, 1.0% Tropicamide, 0.5% Proparacaine @ 8:11 AM                  Slit Lamp and Fundus Exam       External Exam         Right Left    External Normal ocular adnexae, lacrimal gland & drainage, orbits Normal ocular adnexae, lacrimal gland & drainage, orbits              Slit Lamp Exam         Right Left    Lids/Lashes 1-2+ mgd Normal structure & position    Conjunctiva/Sclera Normal bulbar/palpebral, conjunctiva, sclera Normal bulbar/palpebral, conjunctiva, sclera    Cornea 2+ inf pee, 5s tbut 2+ inf pee, 5s tbut    Anterior Chamber Clear & deep Clear & deep    Iris Normal shape, size, morphology Normal shape, size, morphology    Lens pciol s/p yag pciol s/p yag    Vitreous pvd pvd              Fundus Exam          Right Left    Disc Normal size, appearance, nerve fiber layer Normal size, appearance, nerve fiber layer    C/D Ratio 0.25 0.25    Macula Normal, flat, clear vitelliform lesion, no srf/h/l    Vessels Normal, 2/3 caliber Normal, 2/3 caliber    Periphery Flat no breaks holes or tears 360 Flat no breaks holes or tears 360                  Refraction       Wearing Rx         Sphere Cylinder Axis Add    Right [place] -1.25 109 +2.50    Left +0.50 -1.50 077 +2.50      Type: PAL                          Assessment/Plan:      Vitelliform lesion of macula   Imaging 03/28/2023  OCT: 247 cmt flat OD, trace PED EF no fluid OD, 259 cmt vitelliform lesion at fovea OS  Photos: wnl OD, vitelliform lesion at fovea, no CNVM OS  FAF: wnl OD, hyperFA at fovea of vitelliform lesion OS  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 Imogene Burn and prescription dispensed   - reprinted MRX 03/28/23    Dry eyes, bilateral  - Followed by Dr. Imogene Burn, meibomian gland dysfunction  - Has breuder mask, uses intermittently due to rash and headaches   - AT in morning to improve near vision      Follow-up:  8 m oct mac and FAF ou with Dr. Adriana Reams in [facility] for re-evaluation, sooner if vision decreases or symptoms worsen. All findings have been discussed with the patient and any family or associates present and they have expressed understanding of this discussion and have had their questions addressed.              [1]   Current Outpatient Medications:     psyllium (METAMUCIL) 95 % packet, Take 1 packet by mouth daily., Disp: , Rfl:      amLODIPine (NORVASC) 5 mg tablet, TAKE 1 TABLET BY MOUTH EVERY DAY, Disp: 90 tablet, Rfl: 1    generic DME, Item to dispense: folding walker.  Instructions for use: use to assist ambulation., Disp: 1 each, Rfl: 0    cyclobenzaprine (FLEXERIL) 10 mg tablet, Take 1 tablet (10 mg total) by mouth 2 times daily as needed for Muscle spasms., Disp: 60 tablet, Rfl: 2    famotidine (PEPCID) 20 mg tablet, Take 1 tablet (20 mg total) by mouth 2 times daily as needed for Heartburn., Disp: 60 tablet, Rfl: 5    hydroCHLOROthiazide 12.5 MG tablet, Take 1 tablet (12.5 mg total) by mouth daily., Disp: 90 tablet, Rfl: 1    simvastatin (ZOCOR) 20 mg tablet, TAKE 1 TABLET BY MOUTH EVERY DAY WITH DINNER, Disp: 90 tablet, Rfl: 3    senna (SENOKOT) 8.6 mg tablet, Take 2 tablets by mouth daily  for Constipation, Disp: 60 tablet, Rfl: 2    polyethylene glycol (GLYCOLAX,MIRALAX) 17 g powder packet, Take by mouth daily, Disp: , Rfl:     melatonin 3 mg, Take 1 tablet (3 mg total) by mouth nightly as needed for Sleep., Disp: , Rfl:     Calcium Carbonate-Vitamin D (CALCIUM 500 + D PO), Take 1 tablet by mouth 2 times daily, Disp: , Rfl:     aspirin 81 MG tablet, Take 1 tablet (81 mg total) by mouth daily, Disp: 90 tablet, Rfl: 1  [2]   Social History  Tobacco Use   Smoking Status Former    Packs/day: 1.00    Years: 30.00    Additional pack years: 0.00    Total pack years: 30.00    Types: Cigarettes    Quit date: 06/15/2013    Years since quitting: 9.7   Smokeless Tobacco Former

## 2023-03-31 ENCOUNTER — Other Ambulatory Visit: Payer: Self-pay

## 2023-04-01 ENCOUNTER — Ambulatory Visit: Payer: Medicare (Managed Care) | Admitting: Family Medicine

## 2023-04-01 ENCOUNTER — Encounter: Payer: Self-pay | Admitting: Family Medicine

## 2023-04-01 VITALS — BP 132/82 | HR 76 | Temp 97.2°F | Ht 65.0 in | Wt 143.4 lb

## 2023-04-01 DIAGNOSIS — N3941 Urge incontinence: Secondary | ICD-10-CM

## 2023-04-01 DIAGNOSIS — H3554 Dystrophies primarily involving the retinal pigment epithelium: Secondary | ICD-10-CM

## 2023-04-01 DIAGNOSIS — M810 Age-related osteoporosis without current pathological fracture: Secondary | ICD-10-CM

## 2023-04-01 DIAGNOSIS — E785 Hyperlipidemia, unspecified: Secondary | ICD-10-CM

## 2023-04-01 DIAGNOSIS — K5900 Constipation, unspecified: Secondary | ICD-10-CM

## 2023-04-01 DIAGNOSIS — H04129 Dry eye syndrome of unspecified lacrimal gland: Secondary | ICD-10-CM

## 2023-04-01 DIAGNOSIS — I10 Essential (primary) hypertension: Secondary | ICD-10-CM

## 2023-04-01 NOTE — Patient Instructions (Addendum)
 Get blood work done fasting 1-2 weeks prior to your wellness visit     Patient Education   Patient Education     Bladder training   The Basics   Written by the doctors and editors at UpToDate   What is bladder training? -- Bladder training means changing your habits to better control your bladder (figure 1). It can help with urinary problems like:   Frequency - This means having to use the toilet very frequently.   Urgency - This means suddenly feeling the need to urinate right away.   Leakage - This is when urgency leads to actually leaking urine. It is also called "urge incontinence."  Talk to your doctor or nurse before trying bladder training on your own. They will want to make sure that you do it correctly. They might also want to check for other problems, such as a urinary tract infection.  What is a bladder diary? -- Bladder training involves trying to increase the amount of time between trips to the toilet. A "bladder diary" is a way to keep track of how often you go (form 1). Doctors sometimes call this a "voiding diary."  In the diary, write down:   The time you urinate   The amount of urine, if your doctor asked you to measure this   If you had any leaking, how much (small, medium, or large amount), and what you were doing when the leakage happened   The amounts and types of fluids you drink   Any other symptoms you have  How do I train my bladder? -- Once you have an idea of how often you are urinating, try to wait a little longer between trips to the toilet. This should be a gradual process:   First, slightly increase the amount of time between bathroom visits. For example, if you normally go every hour, add 15 minutes. This means trying to wait 1 hour and 15 minutes between trips to the bathroom.   Keep following this schedule for several days. If you can do this for 3 or 4 days without problems, increase the time again. For example, you can add 15 more minutes between trips to the bathroom.   Keep  doing this until you can wait at least 2 to 3 hours between bathroom visits. It can take time to get to this point. Some people notice improvement within a few weeks of bladder training. But it can take longer.  Keep filling out your bladder diary as you work on bladder training. This will help you see improvement over time. The process might take several weeks.  What else can I do to help with urgency? -- Part of bladder training involves learning to control the urge to urinate and make your bladder wait. Some things you can do to help with this:   Squeeze your pelvic muscles - These include the muscles that control the flow of urine. Try squeezing the muscles and then relaxing them. Repeat this several times.   Change positions - It might help to cross your legs or sit on a firm surface.   Distract your mind - Try to think about something else until the urge passes.   Relax - Stay still when you get the urge to urinate. It might help to do deep breathing exercises. Do not rush to the toilet when it is time to go.  What else should I know? -- These tips might help with bladder training:   Try to  only urinate when you actually need to go - For example, avoid going to the bathroom before leaving home "just in case." This can train your brain to think that your bladder is full when it really isn't.   Drink fluids throughout the day - Make sure that you drink enough fluids to stay hydrated. This usually means about 8 cups (64 ounces) a day. Try to spread this throughout the day instead of drinking a lot all at once. If you usually drink a lot more than this, ask your doctor or nurse if it is OK for you to reduce the amount.   Avoid drinking fluids soon before bedtime - This can lower the chances that you will need to urinate during the night.   Limit or avoid alcohol and caffeine - Some people find that these things make them need to urinate more.  When should I call the doctor? -- Call your doctor or nurse if:   Your  urinary symptoms are not getting better or are getting worse.   You are having trouble with your training schedule - Bladder training can be frustrating and take time. But don't give up. Your doctor or nurse can help you.   You have other problems with urinating, such as pain or burning, not being able to urinate, or seeing blood in your urine.  All topics are updated as new evidence becomes available and our peer review process is complete.  This topic retrieved from UpToDate on: Mar 17, 2022.  Topic 454098 Version 3.0  Release: 32.2.4 - C32.74   2024 UpToDate, Inc. and/or its affiliates. All rights reserved.  figure 1: Anatomy of the urinary tract     Urine is made by the kidneys. It passes from the kidneys into the bladder through 2 tubes called the ureters. Then, it leaves the bladder through another tube called the urethra.  Graphic 11914 Version 8.0  form 1: Voiding diary     To use this diary, make a note of these things every time you urinate:  The time (for example, "10:30 AM")  The amount of urine, if your doctor asked you to measure this  Whether you leaked any urine, and about how much (for example, small/medium/large, or whether or not you needed to change your underwear)  When you went to bed and woke up, what you had to drink, and anything else that might have caused you to urinate (for example, "just had coffee," "coughed," "was running to the bathroom," or "just took my water pill")  Start the record in the morning the first time you go to the bathroom after you get up.  Graphic [phone] Version 1.0  Consumer Information Use and Disclaimer   Disclaimer: This generalized information is a limited summary of diagnosis, treatment, and/or medication information. It is not meant to be comprehensive and should be used as a tool to help the user understand and/or assess potential diagnostic and treatment options. It does NOT include all information about conditions, treatments, medications, side effects, or  risks that may apply to a specific patient. It is not intended to be medical advice or a substitute for the medical advice, diagnosis, or treatment of a health care provider based on the health care provider's examination and assessment of a patient's specific and unique circumstances. Patients must speak with a health care provider for complete information about their health, medical questions, and treatment options, including any risks or benefits regarding use of medications. This information does not endorse  any treatments or medications as safe, effective, or approved for treating a specific patient. UpToDate, Inc. and its affiliates disclaim any warranty or liability relating to this information or the use thereof.The use of this information is governed by the Terms of Use, available at https://www.wolterskluwer.com/en/know/clinical-effectiveness-terms. 2024 UpToDate, Inc. and its affiliates and/or licensors. All rights reserved.  Copyright    2024 UpToDate, Inc. and/or its affiliates. All rights reserved.

## 2023-04-01 NOTE — Progress Notes (Signed)
 [facility]     Subjective     [name] is a 83 y.o. female who presents for Follow-up (6 mth fuv)  History of Present Illness  The patient is an 83 year old female who presents for follow-up of chronic conditions.    She has been experiencing ocular issues and was referred to [facility], where a lesion was identified on her macula. She also has dry eye and difficulty with vision up close. She has been prescribed three different types of eye drops but is often unsure when to use them. She was advised to use a mask, but discontinued its use due to a rash on her face. She has been advised to return for a follow-up appointment in January 2026 unless her symptoms worsen.    Her constipation has improved with the use of psyllium and Metamucil, resulting in 2 to 3 bowel movements per day. She is considering switching from psyllium powder to gummies. She has lost 7 pounds through the use of a foot pedal exerciser, but has since regained some weight. She is currently consuming more than 25 grams of fiber daily.    Her blood pressure readings have been stable at home, averaging in the 130s over 80s. She is currently on hydrochlorothiazide and amlodipine for blood pressure management.    She experiences frequent urination, necessitating bathroom visits every 45 minutes, and incontinence at night, requiring her to get up 2 to 3 times.     She is currently undergoing physical therapy for neck pain, which has resulted in improved leg strength. However, she experiences a clicking sound when turning her head.    She reports c/o short term memory issues. Forgetting where she put her phone, needing more lists. She denies safety concerns. Her fmaily has not commented on the issue, but it bothers her.          Objective   Blood pressure 132/82, pulse 76, temperature 36.2 C (97.2 F), height 1.651 m (5\' 5" ), weight 65 kg (143 lb 6.4 oz), SpO2 94%.  Physical Exam  General appearance: Well appearing, in  NAD.  Cardiovascular:  Warm and well perfused.  Pulmonary:  Non-labored breathing.  Skin: Warm and dry.  Neuro:  Alert and oriented. CN II-XII grossly intact.      Results            Assessment & Plan  1. Essential hypertension (Primary)  BP is at goal of <140/90. Potassium was last checked on 06/26/2022 and was 4.24mmol/L, which is within acceptable limits. Creatinine was last checked on 06/26/2022 and was 1.21mg /dL, which  is stable .   Medication regimen as of TODAY is as follows:   Antihypertensive Medications              amLODIPine (NORVASC) 5 mg tablet (Taking) TAKE 1 TABLET BY MOUTH EVERY DAY    hydroCHLOROthiazide 12.5 MG tablet (Taking) Take 1 tablet (12.5 mg total) by mouth daily.          Obtain fasting labs 1-2 weeks prior to SWV, CPE  - CBC and differential; Future  - Comprehensive metabolic panel; Future  - Hemoglobin A1c; Future    2. Hyperlipidemia  Continue simvastatin 20 mg daily   - Lipid Panel (Reflex to Direct  LDL if Triglycerides more than 400); Future    3. Age-related osteoporosis without current pathological fracture  Continue calcium, vitamin D supplementation and weight bearing exercise  DEXA due 2026  - Comprehensive metabolic panel; Future  -  Vitamin D; Future    4. Constipation  Chronic condition, improved with metamucil and miralax     5. Urge incontinence  Given information on bladder training in AVS  Consider pelvic floor PT if symptoms not improving     6. Dry eye  7. Vitelliform lesion of macula  Continue care per ophthalmology  Encouraged her to call eye doctor to clarify eye drop prescriptions                  Author: [name], MD  Note signed: 04/01/2023

## 2023-04-02 ENCOUNTER — Other Ambulatory Visit: Payer: Self-pay

## 2023-04-02 NOTE — Progress Notes (Unsigned)
 [facility]  PT Treatment Note      Name: [name]  DOB: 1940/12/07  Referring Physician: [name], MD  Diagnosis:   No diagnosis found.        Subjective:  Pain Assessment:        Objective:  ROM -   Cervical/Lumbar  LUMBAR AROM / Movement pattern  Flexion: Reach to mid shin with normal movement, painful arc  Extension:  moderate loss with normal movement  Left Sidebend: Reach to proximal knee with normal movement  Right Sidebend: Reach to proximal knee with normal movement  Left Rotation:  significant loss with normal movement  Right Rotation:  significant loss with normal movement    Cervical AROM:  Flexion: Minor loss  Extension: Moderate loss  Left Sidebend: Moderate loss, tightness on left side   Right Sidebend: Moderate loss  Left Rotation: Moderate loss, tightness on left side  Right Rotation: Moderate loss  LE/UE  NA LEFT   RIGHT   Strength    PROM AROM PROM AROM Left Right   Shoulder flex  [facility]  WFL 4 4   Shoulder abd  WFL  WFL 4 4   Elbow ext     4 4   Elbow flex     4 4            Hip flex WNL  WNL  3+ pain 4-   Hip IR WNL  WNL  4 4   Hip ER WNL  WNL  4- 4-   Knee flex     4- 4   Knee ext     4 4   Ankle DF     4- 4     Strength - Therapeutic Exercises per flowsheet  Function: - Unchanged;   Education:  Updated HEP, Verbal cues for ther ex, Manual cues for ther ex, Spine ed concepts    Objective        Treatment:  Ther Exercise per flowsheet  Recumbent bike warm up 5 min   Seated hamstring stretch  Standing calf stretch 2 x 30s   Seated lumbar roll outs flexion/lateral flexion 30x   Supine cervical retraction 2 x 10 x 2s hold   Supine TA holds 3 x 10-20s   Supine TA march 2 x 10    SKTC 10x   LTR 20x   Supine hip add squeeze 3 x 10 x 5s hold     Supine clamshell --   Seated scapular retractions 3 x 10         Sit to stand  ---    Bilateral calf raise ---    Banded row ---                        Assessment:          Plan of Care:  Continue per Plan of care -   As written; Patient would benefit from skilled rehabilitation services to address the above impairments to restore functional capacity.    Thank you for referring this patient to [facility]    Dessie Coma, PT              Treatment Start Time    Treatment End Time    Total Minutes of treatment        Total Non-Treatment time (rest)    Total Service Based min of treatment  Total Time-Based min of treatment            Service-Based Procedures/ Modalities    Evaluation    Re-evaluation    Traction, mechanical    Electric stimulation (unattended)    Vasopneumatic device    Whirlpool    Total Service Based Treatment        Time-Based Procedures / Modalities    Therapeutic ex    Manual Therapy    Therapeutic Activities    Neuromuscular Re-ed    Physical Performance Test    Gait training, including stairs    Self Care/ Home Management Training    Ultrasound    Electric stimulation (manual)    Iontophoresis    Contrast baths    Total Time-Based Treatment           POC DATE: 03/20/2023

## 2023-04-03 ENCOUNTER — Ambulatory Visit: Payer: Medicare (Managed Care) | Attending: Family Medicine | Admitting: Physical Therapy

## 2023-04-03 DIAGNOSIS — M48062 Spinal stenosis, lumbar region with neurogenic claudication: Secondary | ICD-10-CM | POA: Insufficient documentation

## 2023-04-03 DIAGNOSIS — S161XXD Strain of muscle, fascia and tendon at neck level, subsequent encounter: Secondary | ICD-10-CM | POA: Insufficient documentation

## 2023-04-05 ENCOUNTER — Other Ambulatory Visit: Payer: Self-pay

## 2023-04-05 DIAGNOSIS — I714 Abdominal aortic aneurysm, without rupture, unspecified: Secondary | ICD-10-CM

## 2023-04-07 ENCOUNTER — Other Ambulatory Visit: Payer: Self-pay

## 2023-04-08 ENCOUNTER — Ambulatory Visit
Admission: RE | Admit: 2023-04-08 | Discharge: 2023-04-08 | Disposition: A | Discharge status: Home / Self Care | Payer: Medicare (Managed Care) | Source: Ambulatory Visit | Attending: Vascular Surgery | Admitting: Vascular Surgery

## 2023-04-08 DIAGNOSIS — I714 Abdominal aortic aneurysm, without rupture, unspecified: Secondary | ICD-10-CM | POA: Insufficient documentation

## 2023-04-08 DIAGNOSIS — Z95828 Presence of other vascular implants and grafts: Secondary | ICD-10-CM | POA: Insufficient documentation

## 2023-04-08 LAB — CV DOPPLER AORTA COMPLETE
Aorta Diameter A-P Distal: 2.81 cm
Aorta Diameter A-P Mid: 3.33 cm
Aorta Diameter A-P Prox: 2.52 cm
Aorta Diameter Trans Distal: 2.96 cm
Aorta Diameter Trans Mid: 3.27 cm
Aorta Diameter Trans Prox: 2.45 cm
Aorta EDV Distal: 8.04 cm/s
Aorta EDV Mid: 9.93 cm/s
Aorta EDV Prox: 12.76 cm/s
Aorta PSV Distal: 36.4 cm/s
Aorta PSV Mid: 36.4 cm/s
Aorta PSV Prox: 44.91 cm/s
Left Common Iliac Artery Prox AP Diameter: 1.31 cm
Left Common Iliac Artery Prox EDV: 0 cm/s
Left Common Iliac Artery Prox PSV: 74.31 cm/s
Left Common Iliac Artery Prox Trans Diameter: 1.32 cm
Right Common Iliac Artery Prox AP Diameter: 1.39 cm
Right Common Iliac Artery Prox EDV: 0 cm/s
Right Common Iliac Artery Prox PSV: 76.14 cm/s
Right Common Iliac Artery Prox Trans Diameter: 1.44 cm

## 2023-04-09 ENCOUNTER — Other Ambulatory Visit: Payer: Self-pay

## 2023-04-09 NOTE — Progress Notes (Unsigned)
 [facility] Orthopedic Sports/Spine Rehabilitation  PT Treatment Note      Name: [name]  DOB: August 07, 1940  Referring Physician: [name], MD  Diagnosis:   No diagnosis found.        Subjective:  Pain Assessment:        Objective:  ROM -   Cervical/Lumbar  LUMBAR AROM / Movement pattern  Flexion: Reach to mid shin with normal movement, painful arc  Extension:  moderate loss with normal movement  Left Sidebend: Reach to proximal knee with normal movement  Right Sidebend: Reach to proximal knee with normal movement  Left Rotation:  significant loss with normal movement  Right Rotation:  significant loss with normal movement    Cervical AROM:  Flexion: Minor loss  Extension: Moderate loss  Left Sidebend: Moderate loss, tightness on left side   Right Sidebend: Moderate loss  Left Rotation: Moderate loss, tightness on left side  Right Rotation: Moderate loss  LE/UE  NA LEFT   RIGHT   Strength    PROM AROM PROM AROM Left Right   Shoulder flex  [facility]  WFL 4 4   Shoulder abd  WFL  WFL 4 4   Elbow ext     4 4   Elbow flex     4 4            Hip flex WNL  WNL  3+ pain 4-   Hip IR WNL  WNL  4 4   Hip ER WNL  WNL  4- 4-   Knee flex     4- 4   Knee ext     4 4   Ankle DF     4- 4     Strength - Therapeutic Exercises per flowsheet  Function: - Improved;  Education:  Updated HEP, Verbal cues for ther ex, Manual cues for ther ex    Objective        Treatment:  Ther Exercise per flowsheet  Recumbent bike warm up 5 min   Seated hamstring stretch  Standing calf stretch 2 x 30s   Seated lumbar roll outs flexion/lateral flexion 30x   Supine cervical retraction - increase hold time 2 x 10 x 5s hold   Supine TA holds - increase hold  10 x 5s   Supine TA march 2 x 10    SLR ---    SKTC 10x   LTR 20x   Supine hip add squeeze 3 x 10 x 5s hold     Supine clamshell 3 x 10 G TB    Seated scapular retractions 3 x 10         Sit to stand  ---    Bilateral calf raise 3 x 10    Banded row 3 x 10 O TB                         Assessment:           Plan of Care:  Continue per Plan of care -  As written; Patient would benefit from skilled rehabilitation services to address the above impairments to restore functional capacity.    Thank you for referring this patient to [facility] Orthopaedics - Sports and Spine Rehabilitation    Dessie Coma, PT              Treatment Start Time    Treatment End Time    Total Minutes of treatment  Total Non-Treatment time (rest)    Total Service Based min of treatment    Total Time-Based min of treatment            Service-Based Procedures/ Modalities    Evaluation    Re-evaluation    Traction, mechanical    Electric stimulation (unattended)    Vasopneumatic device    Whirlpool    Total Service Based Treatment        Time-Based Procedures / Modalities    Therapeutic ex    Manual Therapy    Therapeutic Activities    Neuromuscular Re-ed    Physical Performance Test    Gait training, including stairs    Self Care/ Home Management Training    Ultrasound    Electric stimulation (manual)    Iontophoresis    Contrast baths    Total Time-Based Treatment           POC DATE: 03/20/2023

## 2023-04-10 ENCOUNTER — Ambulatory Visit: Payer: Medicare (Managed Care) | Admitting: Physical Therapy

## 2023-04-10 ENCOUNTER — Other Ambulatory Visit: Payer: Self-pay

## 2023-04-10 DIAGNOSIS — M48062 Spinal stenosis, lumbar region with neurogenic claudication: Secondary | ICD-10-CM

## 2023-04-10 DIAGNOSIS — S161XXD Strain of muscle, fascia and tendon at neck level, subsequent encounter: Secondary | ICD-10-CM

## 2023-04-14 ENCOUNTER — Other Ambulatory Visit: Payer: Self-pay

## 2023-04-14 NOTE — Progress Notes (Signed)
 [facility] Orthopedic Sports/Spine Rehabilitation  PT Treatment Note      Name: [name]  DOB: [birth year]  Referring Physician: [name], MD  Diagnosis:   1. Strain of neck muscle, subsequent encounter        2. Spinal stenosis of lumbar region with neurogenic claudication              Subjective:  Pain Assessment: 3  Pt reports that she was very sore following last session after the addition of the sit to stands. She started walking for exercise again. She notes mild improvements in her neck symptoms.      Objective:  ROM -   Cervical/Lumbar  LUMBAR AROM / Movement pattern  Flexion: Reach to mid shin with normal movement, painful arc  Extension:  moderate loss with normal movement  Left Sidebend: Reach to proximal knee with normal movement  Right Sidebend: Reach to proximal knee with normal movement  Left Rotation:  significant loss with normal movement  Right Rotation:  significant loss with normal movement    Cervical AROM:  Flexion: Minor loss  Extension: Moderate loss  Left Sidebend: Moderate loss, tightness on left side   Right Sidebend: Moderate loss  Left Rotation: Moderate loss, tightness on left side  Right Rotation: Moderate loss  LE/UE  NA LEFT   RIGHT   Strength    PROM AROM PROM AROM Left Right   Shoulder flex  [facility]  WFL 4 4   Shoulder abd  [facility]  WFL 4 4   Elbow ext     4 4   Elbow flex     4 4            Hip flex WNL  WNL  3+ pain 4-   Hip IR WNL  WNL  4 4   Hip ER WNL  WNL  4- 4-   Knee flex     4- 4   Knee ext     4 4   Ankle DF     4- 4     Strength - Therapeutic Exercises per flowsheet  Function: - Improved; Pt reports that she has returned to walking for exercise.   Education:  Updated HEP, Verbal cues for ther ex, Manual cues for ther ex, Spine ed concepts    Objective        Treatment:  Ther Exercise per flowsheet  Recumbent bike warm up 5 min   Seated hamstring stretch  Standing calf stretch 2 x 30s   Seated lumbar roll outs flexion/lateral flexion 30x   Supine cervical retraction  3  x 8 x 10s hold   Supine TA holds 4 x 30s   Supine TA march 2 x 10    SLR 3 x 6-8     SKTC 2 x 10    LTR 20x   Supine hip add squeeze 3 x 10 x 5s hold     Supine clamshell 3 x 10 G TB    Seated scapular retractions 3 x 10         Sit to stand  3 x 4 G TB at knees   Bilateral calf raise 3 x 10    Banded row 3 x 10 O TB     Step ups 2 x 8, blue low                    Assessment:   Pt reported relief of her lumbar discomfort with the seated  lumbar roll-outs. She required verbal and manual cuing for proper form during review of her home program. Pt also required many verbal cues for proper tempo during exercises. Pt reported mildly increased discomfort in her lumbar region during the sit to stands, described as a "pulling". A Theraband was utilized to promote reduced knee valgus during the sit to stands. Pt required upper extremity support and SBA for balance during the step ups.        Plan of Care:  Continue per Plan of care -  As written; Patient would benefit from skilled rehabilitation services to address the above impairments to restore functional capacity.    Thank you for referring this patient to [facility] Orthopaedics - Sports and Spine Rehabilitation    Ermalinda Hays, PT              Treatment Start Time 9:00 am    Treatment End Time 9:45 am    Total Minutes of treatment 45       Total Non-Treatment time (rest)    Total Service Based min of treatment    Total Time-Based min of treatment 45           Service-Based Procedures/ Modalities    Evaluation    Re-evaluation    Traction, mechanical    Electric stimulation (unattended)    Vasopneumatic device    Whirlpool    Total Service Based Treatment        Time-Based Procedures / Modalities    Therapeutic ex 45   Manual Therapy    Therapeutic Activities    Neuromuscular Re-ed    Physical Performance Test    Gait training, including stairs    Self Care/ Home Management Training    Ultrasound    Electric stimulation (manual)     Iontophoresis    Contrast baths    Total Time-Based Treatment 45          POC DATE: 03/20/2023

## 2023-04-15 ENCOUNTER — Ambulatory Visit: Payer: Medicare (Managed Care) | Admitting: Physical Therapy

## 2023-04-15 DIAGNOSIS — S161XXD Strain of muscle, fascia and tendon at neck level, subsequent encounter: Secondary | ICD-10-CM

## 2023-04-15 DIAGNOSIS — M48062 Spinal stenosis, lumbar region with neurogenic claudication: Secondary | ICD-10-CM

## 2023-04-17 ENCOUNTER — Ambulatory Visit: Payer: Medicare (Managed Care) | Admitting: Ophthalmology

## 2023-04-17 ENCOUNTER — Encounter: Payer: Self-pay | Admitting: Retina Ophthalmology

## 2023-04-25 ENCOUNTER — Other Ambulatory Visit: Payer: Self-pay

## 2023-04-25 NOTE — Progress Notes (Signed)
 [facility] Orthopedic Sports/Spine Rehabilitation  PT Treatment Note      Name: [name]  DOB: [birth year]  Referring Physician: Vicenta Graft, MD  Diagnosis:   1. Strain of neck muscle, subsequent encounter        2. Spinal stenosis of lumbar region with neurogenic claudication                Subjective:  Pain Assessment: 3  Pt reports that she had increased back pain this week. She notes that she is feeling better today. Pt also reports that she was able to ambulate around her neighborhood a few days ago with no significant lumbar discomfort noted. She has also noticed an improvement in her cervical symptoms.      Objective:  ROM -   Cervical/Lumbar  LUMBAR AROM / Movement pattern  Flexion: Reach to mid shin with normal movement, painful arc  Extension:  moderate loss with normal movement  Left Sidebend: Reach to proximal knee with normal movement  Right Sidebend: Reach to proximal knee with normal movement  Left Rotation:  significant loss with normal movement  Right Rotation:  significant loss with normal movement    Cervical AROM:  Flexion: Minor loss  Extension: Moderate loss  Left Sidebend: Moderate loss, tightness on left side   Right Sidebend: Moderate loss  Left Rotation: Moderate loss, tightness on left side  Right Rotation: Moderate loss  LE/UE  NA LEFT   RIGHT   Strength    PROM AROM PROM AROM Left Right   Shoulder flex  [facility]  WFL 4 4   Shoulder abd  [facility]  WFL 4 4   Elbow ext     4 4   Elbow flex     4 4            Hip flex WNL  WNL  3+ pain 4-   Hip IR WNL  WNL  4 4   Hip ER WNL  WNL  4- 4-   Knee flex     4- 4   Knee ext     4 4   Ankle DF     4- 4     Strength - Therapeutic Exercises per flowsheet  Function: - Improved; Pt reported that she was able to ambulate around her neighborhood with no discomfort in her lumbar region.   Education:  Verbal cues for ther ex, Manual cues for ther ex, Spine ed concepts    Objective        Treatment:  Ther Exercise per flowsheet  Recumbent bike warm up 5 min    Seated hamstring stretch  Standing calf stretch 2 x 30s   Seated lumbar roll outs flexion/lateral flexion 30x   Supine cervical retraction  3 x 10 x 10s hold   Supine TA holds 4 x 30s   Supine TA march 2 x 10    SLR 2 x 6     SKTC 2 x 10    LTR 20x   Supine hip add squeeze 3 x 10 x 5s hold     Supine clamshell 3 x 10 G TB    Seated scapular retractions 3 x 10         Sit to stand  3 x 6 G TB at knees   Bilateral calf raise 3 x 10    Banded row 3 x 10 O TB     Step ups 2 x 8, blue low    Paloff press ---  Romberg balance  Tandem balance   Single leg balance  1 x 30s  2 x 30s  6 x 10-15s UE support       Assessment:   Pt tolerated the session well. She had mild discomfort in her right lumbar region with LTR to the left, which reduced with additional repetitions. She also noted some "pulling" sensations in her back during the SLR and was provided with verbal and manual cuing for proper form and core engagement. Pt required verbal cuing to avoid breath holding during the session. She had difficulty with the SLS and required SBA for safety. Plan to perform a reassessment of her symptoms next session.        Plan of Care:  Continue per Plan of care -  As written; Patient would benefit from skilled rehabilitation services to address the above impairments to restore functional capacity.    Thank you for referring this patient to [facility] Orthopaedics - Sports and Spine Rehabilitation    Ermalinda Hays, PT              Treatment Start Time 8:30 am    Treatment End Time 9:15 am    Total Minutes of treatment 45       Total Non-Treatment time (rest)    Total Service Based min of treatment    Total Time-Based min of treatment 45           Service-Based Procedures/ Modalities    Evaluation    Re-evaluation    Traction, mechanical    Electric stimulation (unattended)    Vasopneumatic device    Whirlpool    Total Service Based Treatment        Time-Based Procedures / Modalities    Therapeutic  ex 45   Manual Therapy    Therapeutic Activities    Neuromuscular Re-ed    Physical Performance Test    Gait training, including stairs    Self Care/ Home Management Training    Ultrasound    Electric stimulation (manual)    Iontophoresis    Contrast baths    Total Time-Based Treatment 45          POC DATE: 03/20/2023

## 2023-04-26 ENCOUNTER — Ambulatory Visit: Payer: Medicare (Managed Care) | Admitting: Physical Therapy

## 2023-04-26 DIAGNOSIS — S161XXD Strain of muscle, fascia and tendon at neck level, subsequent encounter: Secondary | ICD-10-CM

## 2023-04-26 DIAGNOSIS — M48062 Spinal stenosis, lumbar region with neurogenic claudication: Secondary | ICD-10-CM

## 2023-05-07 ENCOUNTER — Other Ambulatory Visit: Payer: Self-pay

## 2023-05-07 NOTE — Progress Notes (Signed)
 Sent via: eRecord EMR INBASKET    Physician attestation for [facility] Plan of Care: Physician/NP/PA:      Vaughn Pizza, MD     Per signature, I have reviewed and agree with the documented plan of care.    ___________________________________________________________    Please sign this Medicare plan of care for outpatient therapy treatment as required by Medicare. If you have any questions, please contact us  at [phone]. We appreciate your prompt attention to this request.    Sincerely,   [place] [facility]         [facility] ORTHO SPORTS + SPINE REHABILITATION RE-EVALUATION    History  Diagnosis: Strain of neck muscle, Lumbar spinal stenosis   Referring MD:     Vaughn Pizza, MD       Subjective    [name] is a 83 y.o. female who is receiving treatment for low back pain and cervical pain. Pt reports that she has been doing a lot of exercises lately. Her lumbar symptoms have not improved and she still experiences discomfort with prolonged ambulation. She also notes that her neck still cracks but she does not experience pain associated with it.  Mechanism of injury/history of symptoms: No specific cause     Pt has received care for 6 visits to date.    Symptom location: Lateral and Posterior, bilateral lumbar/gluteal   Relevant symptoms: Aching, Sharp, Pain   Symptom frequency: Intermittent  Symptom intensity (0 - 10 scale): Now 4 Best 0 Worst 10  Patient's goals for therapy:  Reduce pain, walk without symptoms     Objective:    Palpation:  Generalized Lumbar Pain    ROM Loss Lumbar  Flexion: no loss in motion  Extension:  moderate loss  Left Sidebend: minor loss  Right Sidebend:  minor loss  Left Rotation:  moderate loss  Right Rotation:  moderate loss    Cervical AROM:  Flexion: WNL  Extension: Mild loss  Left Sidebend: Moderate loss  Right Sidebend: Moderate loss  Left Rotation: Moderate loss  Right Rotation: Moderate loss    Lumbar Repeated Movement Testing:   Flexion bias     Neuromuscular Assessment: Myotomes:  LE Normal     Special Tests:  Cervical: NA  Lumbar: Straight Leg Raise,  Right LE  negative, Left LE   negative  Crossed Straight Leg Raise, Right LE  negative, Left LE   negative  Elys,  Right LE  negative, Left LE   negative  P-A glide,  positive  Sacroiliac Joint: NA                     Flexibility:   Hamstring:  Moderate tightness  Quads:  Significant tightness  Psoas:  Significant tightness  Gastroc-soleus: Moderate tightness    Strength: Patient performing lower extremity strength training exercises  LE/UE  NA LEFT   RIGHT   Strength    PROM AROM PROM AROM Left Right   Shoulder flex  [facility]  WFL 4 4   Shoulder abd  [facility]  WFL 4 4   Elbow ext     4 4   Elbow flex     4 4            Hip flex WNL  WNL  4- 4-   Hip IR WNL  WNL  4 4   Hip ER WNL  WNL  4- 4-   Knee flex     4- 4-   Knee  ext     4+ 4+   Ankle DF     4 4   Ankle PF      4 4       McGill Stage: Corrective exercise, Core and whole body stability    Comments:  NA    Functional Mobility  Walk more than 15 minutes - unable to perform without pain.  Ascend stairs with reciprocal gait - able to perform.  Descend stairs with reciprocal gait - able to perform.        SPINE ASSESSMENT    Assessment: consistent with 83 y.o. female with   Encounter Diagnoses   Name Primary?    Strain of neck muscle, subsequent encounter Yes    Spinal stenosis of lumbar region with neurogenic claudication     with pain, ROM limitations, strength limitations, functional limitations. Pt was reassessed at today's visit. She has demonstrated improvements in her strength, range of motion, and symptom location. Pt still experiences elevated symptoms primarily located to her low back region, but denies increased radicular symptoms. She also still experiences impairments in strength, flexibility, range of motion, and function. We discussed her progress to date and adjusted her home program as needed. Pt requested to discontinue formal PT services at this  time due to having a busy schedule and focusing on other medical issues. Pt was encouraged to stay consistent with her home program to maintain the gains that she has made in strength and function. She was encouraged to reach out in the future with any questions/concerns she may have or if she requires additional follow-up appointments.    Prognosis:  Good      Contraindications/Precautions/Limitations:  Per diagnosis  Short Term Goals: (1 month(s)):  Decrease c/o max pain to < 4/10  Long Term Goals:(2 month(s)):   Walk community distances without increased symptoms, Stand as long as needed when completing ADL's without increased symptoms, Patient demonstrates improved functional core strength, Patient will return to full prior level of function  , Independent with symptom management    Treatment Plan: Discontinue per pt request.     Thank you for the referral of this patient to the [facility]ine Program.    Sincerely,  Birdena Canny, PT          Treatment Start Time 8:15 am    Treatment End Time 9:00 am    Total Minutes of treatment 45       Total Non-Treatment time (rest)    Total Service Based min of treatment 5   Total Time-Based min of treatment 40           Service-Based Procedures/ Modalities    Evaluation    Re-evaluation 5   Traction, mechanical    Electric stimulation (unattended)    Vasopneumatic device    Whirlpool    Total Service Based Treatment 5       Time-Based Procedures / Modalities    Therapeutic ex 40   Manual Therapy    Therapeutic Activities    Neuromuscular Re-ed    Physical Performance Test    Gait training, including stairs    Self Care/ Home Management Training    Ultrasound    Electric stimulation (manual)    Iontophoresis    Contrast baths    Total Time-Based Treatment 40          POC DATE: 05/08/2023     Recumbent bike warm up 5 min   Seated hamstring stretch  Standing calf stretch 2  x 30s   Seated lumbar roll outs flexion/lateral flexion 30x   Supine cervical retraction  3 x 10 x  10s hold   Supine TA holds 4 x 30s   Supine TA march 2 x 10    SLR 2 x 6     SKTC 2 x 10    LTR 20x   Supine hip add squeeze 3 x 10 x 5s hold     Supine clamshell 3 x 10 G TB    Seated scapular retractions 3 x 10         Sit to stand  2 x 8 G TB at knees   Bilateral calf raise 3 x 10    Banded row 3 x 10 O TB     Step ups 2 x 10, blue low            Romberg balance  Tandem balance   Single leg balance  1 x 30s  2 x 30s  6 x 10-15s UE support

## 2023-05-08 ENCOUNTER — Ambulatory Visit: Payer: Medicare (Managed Care) | Attending: Family Medicine | Admitting: Physical Therapy

## 2023-05-08 DIAGNOSIS — M48062 Spinal stenosis, lumbar region with neurogenic claudication: Secondary | ICD-10-CM | POA: Insufficient documentation

## 2023-05-08 DIAGNOSIS — S161XXD Strain of muscle, fascia and tendon at neck level, subsequent encounter: Secondary | ICD-10-CM | POA: Insufficient documentation

## 2023-05-15 ENCOUNTER — Ambulatory Visit: Payer: Medicare (Managed Care) | Admitting: Ophthalmology

## 2023-05-27 ENCOUNTER — Other Ambulatory Visit: Payer: Self-pay | Admitting: Family Medicine

## 2023-05-27 DIAGNOSIS — K219 Gastro-esophageal reflux disease without esophagitis: Secondary | ICD-10-CM

## 2023-05-28 MED ORDER — FAMOTIDINE 20 MG PO TABS *I*
20.0000 mg | ORAL_TABLET | Freq: Two times a day (BID) | ORAL | 5 refills | Status: AC | PRN
Start: 2023-05-28 — End: ?

## 2023-05-28 NOTE — Telephone Encounter (Signed)
 [facility] Refill Note  05/28/23  [name]  [birth year]  MRN: [id number]    Last office visit: 04/01/2023    Last telehome visit: Visit date not found    Recent Visits  Date Type Provider Dept   04/01/23 Office Visit Vicenta Graft, MD [facility]   12/18/22 Office Visit Schuppert, Richad Champagne, MD [facility]   Showing recent visits within past 185 days in an active department and meeting all other requirements  Future Appointments  No visits were found meeting these conditions.  Showing future appointments within next 0 days in an active department and meeting all other requirements      LABORATORY DATA:  Last Renal Func was  06/26/2022: Creatinine 1.21 mg/dL (H; Ref range: 3.47 - 4.25 mg/dL); eGFR BY CREAT 45 * (!; Ref range: *); Potassium 4.2 mmol/L (Ref range: 3.3 - 5.1 mmol/L).   Last LFTs was  06/18/2013: Bilirubin,Total 0.2 (Ref range: )  06/26/2022: ALT 21 U/L (Ref range: 0 - 35 U/L); AST 28 U/L (Ref range: 0 - 35 U/L).   Last Lipid panel was 06/26/2022: Cholesterol 205 mg/dL (!; Ref range: mg/dL); HDL 70 mg/dL (H; Ref range: 40 - 60 mg/dL); LDL Calculated 956 mg/dL (Ref range: mg/dL).  Last A1C was 06/26/2022: Hemoglobin A1C 5.6 % (Ref range: %).  Last TFT was 06/26/2022: TSH 2.25 uIU/mL (Ref range: 0.27 - 4.20 uIU/mL), No results found for requested labs within last 3650 days..    [name]

## 2023-06-11 ENCOUNTER — Encounter: Payer: Self-pay | Admitting: Retina Ophthalmology

## 2023-06-11 ENCOUNTER — Telehealth: Payer: Self-pay | Admitting: Family Medicine

## 2023-06-11 NOTE — Telephone Encounter (Signed)
 Patient called requesting new referral to ophthalmology states where she went to originally she was left dissatisfied with the help she received. She is still concerned for retinal detachment, is looking for external referral if possible. Please advise.

## 2023-06-11 NOTE — Telephone Encounter (Signed)
 Spoke to patient, she is going to call around to find a new ophthalmologist and will call if referral is needed.   Patient stated that she was not happy because they could not answer her about why she has intermittent double vision.

## 2023-06-16 ENCOUNTER — Other Ambulatory Visit: Payer: Self-pay

## 2023-06-17 ENCOUNTER — Ambulatory Visit: Payer: Medicare (Managed Care) | Attending: Ophthalmology | Admitting: Ophthalmology

## 2023-06-17 DIAGNOSIS — H3554 Dystrophies primarily involving the retinal pigment epithelium: Secondary | ICD-10-CM | POA: Insufficient documentation

## 2023-06-17 DIAGNOSIS — H52203 Unspecified astigmatism, bilateral: Secondary | ICD-10-CM | POA: Insufficient documentation

## 2023-06-17 DIAGNOSIS — H524 Presbyopia: Secondary | ICD-10-CM | POA: Insufficient documentation

## 2023-06-17 DIAGNOSIS — H04123 Dry eye syndrome of bilateral lacrimal glands: Secondary | ICD-10-CM | POA: Insufficient documentation

## 2023-06-17 NOTE — Progress Notes (Signed)
 Outpatient Visit      Patient name: [name]  DOB: [birth year]       Age: 83 y.o.  MR#: [id number]    Encounter Date: 06/17/2023      Assessment/Plan:      Dry eyes, bilateral  Evaporative dry eye can cause intermittent blurry vision  EDE, meibomian gland dysfunction.   No significant pathology seen today, however chronic and fluctuating symptoms likely cause visual disturbances.   - Discontinued breuder mask due to rash and headaches  - Advised other form of warm compress with towel to express oil glands if tolerable   - Add AT in morning to improve near vision   - Gently wash eyelids in morning to minimize ocular surface interference with vision.     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 Venora Gin with plan to follow 01/2024 -- patient states she prefers to be followed with [facility].   - Clarified amsler grid use today: patient asked to call should there be expansion of scotoma or new wavy lines on amsler grid.   - may follow up 01/2023 with [facility] if she cancels Retina     Astigmatism of both eyes with presbyopia   Diagnostic refraction today revealed no changes to her new glasses.   - optimize ocular surface    1 year with [facility], sooner with changes to amsler grid     Subjective:     Chief Complaint   Patient presents with    Follow-up       HPI    [name] is a 83 y.o. female presenting for a follow up complaining   of intermittent blurry vision in the morning or when fatigued at night,   that is not improved with her glasses. Ongoing for 3 weeks. States amsler   grid has been stable. Had a whole day where she could not read or watch   tv. Also having double vision after dozing off while watching tv.     States she has not been using systane or bruder mask bc they cause a rash   at the cheekbone     Ocular meds  Theratears QID OU       Last edited by Alver Austin, OD on 06/17/2023 11:45 AM.        has a current medication list which includes the  following prescription(s): famotidine , psyllium, amlodipine , hydrochlorothiazide , simvastatin , senna, polyethylene glycol, melatonin, calcium -vit d3, and aspirin .     is allergic to fentanyl , oxycodone , environmental allergies, lipitor [atorvastatin ], and tramadol .      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         Objective:     Base Eye Exam       Visual Acuity (Snellen - Linear)         Right Left    Dist cc 20/25 20/30 -2              Tonometry (Tonopen, 11:06 AM)         Right Left    Pressure 19 19              Pupils         Pupils APD    Right PERRLA None    Left PERRLA None              Extraocular Movement         Right Left     Full Full              Neuro/Psych       Oriented x3: Yes    Mood/Affect: Normal                  Slit Lamp and Fundus Exam       External Exam         Right Left    External Normal ocular adnexae, lacrimal gland & drainage, orbits Normal ocular adnexae, lacrimal gland & drainage, orbits              Slit Lamp Exam         Right Left    Lids/Lashes 1-2+ mgd 1-2+ mgd     Conjunctiva/Sclera Normal bulbar/palpebral, conjunctiva, sclera Normal bulbar/palpebral, conjunctiva, sclera    Cornea tbut nl, no staining tbut nl, no staining    Anterior Chamber Clear & deep Clear & deep    Iris Normal shape, size, morphology Normal shape, size, morphology    Lens pciol s/p yag pciol s/p yag              Fundus Exam         Right Left    Disc Normal size, appearance, nerve fiber layer Normal size, appearance, nerve fiber layer                  Refraction       Wearing Rx         Sphere Cylinder Axis Add    Right [place] -1.25 109 +2.50    Left +0.50 -1.50 077 +2.50      Type: PAL              Manifest Refraction         Sphere Cylinder Axis Dist VA Add    Right [place] -1.25 109 ni +2.50    Left +0.50 -1.50 077 ni +2.50

## 2023-07-01 ENCOUNTER — Other Ambulatory Visit
Admission: RE | Admit: 2023-07-01 | Discharge: 2023-07-01 | Disposition: A | Discharge status: Home / Self Care | Payer: Medicare (Managed Care) | Source: Ambulatory Visit | Attending: Family Medicine | Admitting: Family Medicine

## 2023-07-01 ENCOUNTER — Other Ambulatory Visit: Payer: Self-pay | Admitting: Family Medicine

## 2023-07-01 DIAGNOSIS — E785 Hyperlipidemia, unspecified: Secondary | ICD-10-CM | POA: Insufficient documentation

## 2023-07-01 DIAGNOSIS — I10 Essential (primary) hypertension: Secondary | ICD-10-CM | POA: Insufficient documentation

## 2023-07-01 DIAGNOSIS — E7800 Pure hypercholesterolemia, unspecified: Secondary | ICD-10-CM

## 2023-07-01 DIAGNOSIS — M810 Age-related osteoporosis without current pathological fracture: Secondary | ICD-10-CM | POA: Insufficient documentation

## 2023-07-01 LAB — HEMOGLOBIN A1C: Hemoglobin A1C: 5.7 % — ABNORMAL HIGH (ref <=5.6)

## 2023-07-01 LAB — LIPID PANEL
Chol/HDL Ratio: 3.2
Cholesterol: 209 mg/dL — AB
HDL: 66 mg/dL — ABNORMAL HIGH (ref 40–60)
LDL Calculated: 125 mg/dL
Non HDL Cholesterol: 143 mg/dL
Triglycerides: 100 mg/dL

## 2023-07-01 LAB — CBC AND DIFFERENTIAL
Baso # K/uL: 0 10*3/uL (ref 0.0–0.2)
Eos # K/uL: 0.1 10*3/uL (ref 0.0–0.5)
Hematocrit: 44 % (ref 34–49)
Hemoglobin: 13.7 g/dL (ref 11.2–16.0)
Lymph # K/uL: 2.3 10*3/uL (ref 1.0–5.0)
MCV: 93 fL (ref 75–100)
Mono # K/uL: 0.5 10*3/uL (ref 0.1–1.0)
Neut # K/uL: 4 10*3/uL (ref 1.5–6.5)
Nucl RBC # K/uL: 0 10*3/uL (ref 0.0–0.1)
Nucl RBC %: 0 /100{WBCs} (ref 0.0–0.2)
Platelets: 372 10*3/uL (ref 150–450)
RBC: 4.7 MIL/uL (ref 4.0–5.5)
RDW: 13.6 % (ref 0.0–15.0)
Seg Neut %: 58.6 %
WBC: 6.8 10*3/uL (ref 3.5–11.0)

## 2023-07-01 LAB — DIFF MANUAL: Diff Based On: 116 {cells}

## 2023-07-01 LAB — COMPREHENSIVE METABOLIC PANEL
ALT: 17 U/L (ref 0–35)
AST: 30 U/L (ref 0–35)
Albumin: 4.4 g/dL (ref 3.5–5.2)
Alk Phos: 85 U/L (ref 35–105)
Anion Gap: 15 (ref 7–16)
Bilirubin,Total: 0.3 mg/dL (ref 0.0–1.2)
CO2: 23 mmol/L (ref 20–28)
Calcium: 10 mg/dL (ref 8.6–10.2)
Chloride: 99 mmol/L (ref 96–108)
Creatinine: 0.97 mg/dL — ABNORMAL HIGH (ref 0.51–0.95)
Glucose: 103 mg/dL — ABNORMAL HIGH (ref 60–99)
Lab: 14 mg/dL (ref 6–20)
Potassium: 4.3 mmol/L (ref 3.3–5.1)
Sodium: 137 mmol/L (ref 133–145)
Total Protein: 6.9 g/dL (ref 6.3–7.7)
eGFR BY CREAT: 58 — AB

## 2023-07-01 LAB — VITAMIN D: 25-OH Vit Total: 55 ng/mL (ref 30–60)

## 2023-07-01 NOTE — Telephone Encounter (Signed)
 [facility] Refill Note  07/01/23  [name]  03/27/1940  MRN: [id number]    Last office visit: 04/01/2023    Last telehome visit: Visit date not found    Last APP visit:  Visit date not found    Recent Visits  Date Type Provider Dept   04/01/23 Office Visit Vaughn Pizza, MD [facility]   Showing recent visits within past 185 days in an active department and meeting all other requirements  Future Appointments  No visits were found meeting these conditions.  Showing future appointments within next 0 days in an active department and meeting all other requirements      Opioid Drug Screen:  No results for input(s): AMPU, BEU, OPSU, OXYU, THCU, BZDU, COPS, CTHC, UCRNC, TRAMD in the last 8760 hours.    LABORATORY DATA:  Last Renal Func was  06/26/2022: Creatinine 1.21 mg/dL (H; Ref range: 9.48 - 9.04 mg/dL); eGFR BY CREAT 45 * (!; Ref range: *); Potassium 4.2 mmol/L (Ref range: 3.3 - 5.1 mmol/L).   Last LFTs was  06/26/2022: ALT 21 U/L (Ref range: 0 - 35 U/L); AST 28 U/L (Ref range: 0 - 35 U/L).   Last Lipid panel was 06/26/2022: Cholesterol 205 mg/dL (!; Ref range: mg/dL); HDL 70 mg/dL (H; Ref range: 40 - 60 mg/dL); LDL Calculated 879 mg/dL (Ref range: mg/dL).  Last A1C was 06/26/2022: Hemoglobin A1C 5.6 % (Ref range: %).  Last TFT was 06/26/2022: TSH 2.25 uIU/mL (Ref range: 0.27 - 4.20 uIU/mL), No results found for requested labs within last 3650 days..    [name]

## 2023-07-05 ENCOUNTER — Ambulatory Visit: Payer: Self-pay | Admitting: Family Medicine

## 2023-07-05 DIAGNOSIS — R7989 Other specified abnormal findings of blood chemistry: Secondary | ICD-10-CM

## 2023-07-08 ENCOUNTER — Telehealth: Payer: Self-pay | Admitting: Family Medicine

## 2023-07-08 NOTE — Telephone Encounter (Signed)
 Spoke with patient. Relayed message from provider to get repeat CBC to see if giant platelets are truly present. Patient understanding and agreeable to go get blood work done today or tomorrow.

## 2023-07-08 NOTE — Telephone Encounter (Signed)
 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 I would like to repeat the CBC to determine if these are truly present. Giant platelets are not commonly in the blood stream and if they are present I recommend a consult with a hematologist for additional testing.   Pizza Vaughn, MD  July 05, 2023 8:56 AM

## 2023-07-09 ENCOUNTER — Other Ambulatory Visit
Admission: RE | Admit: 2023-07-09 | Discharge: 2023-07-09 | Disposition: A | Discharge status: Home / Self Care | Payer: Medicare (Managed Care) | Source: Ambulatory Visit | Attending: Family Medicine | Admitting: Family Medicine

## 2023-07-09 DIAGNOSIS — R7989 Other specified abnormal findings of blood chemistry: Secondary | ICD-10-CM | POA: Insufficient documentation

## 2023-07-09 LAB — CBC AND DIFFERENTIAL
Baso # K/uL: 0.1 THOU/uL (ref 0.0–0.2)
Eos # K/uL: 0.1 THOU/uL (ref 0.0–0.5)
Hematocrit: 42 % (ref 34–49)
Hemoglobin: 13.4 g/dL (ref 11.2–16.0)
IMM Granulocytes #: 0 THOU/uL
IMM Granulocytes: 0.4 %
Lymph # K/uL: 1.5 THOU/uL (ref 1.0–5.0)
MCV: 92 fL (ref 75–100)
Mono # K/uL: 0.6 THOU/uL (ref 0.1–1.0)
Neut # K/uL: 3.1 THOU/uL (ref 1.5–6.5)
Nucl RBC # K/uL: 0 THOU/uL
Nucl RBC %: 0 /100{WBCs} (ref 0.0–0.2)
Platelets: 341 THOU/uL (ref 150–450)
RBC: 4.5 MIL/uL (ref 4.0–5.5)
RDW: 13.1 % (ref 0.0–15.0)
Seg Neut %: 57.4 %
WBC: 5.4 THOU/uL (ref 3.5–11.0)

## 2023-07-15 ENCOUNTER — Ambulatory Visit: Payer: Self-pay | Admitting: Family Medicine

## 2023-08-06 ENCOUNTER — Ambulatory Visit: Payer: Medicare (Managed Care) | Admitting: Family Medicine

## 2023-09-03 ENCOUNTER — Other Ambulatory Visit: Payer: Self-pay | Admitting: Family Medicine

## 2023-09-03 NOTE — Telephone Encounter (Signed)
 [facility] Refill Note09/02/25[name]  [birth year]  MRN: [id number] Last office visit: 3/31/2025Last telehome visit: Visit date not foundLast APP visit:  Visit date not foundRecent VisitsDate Type Provider Dept 04/01/23 Office Visit Schuppert, Donald, MD [facility] Showing recent visits within past 185 days in an active department and meeting all other requirementsFuture AppointmentsNo visits were found meeting these conditions.Showing future appointments within next 0 days in an active department and meeting all other requirementsOpioid Drug Screen:No results for input(s): AMPU, BEU, OPSU, OXYU, THCU, BZDU, COPS, CTHC, UCRNC, TRAMD in the last 8760 hours.LABORATORY DATA:Last Renal Func was  07/01/2023: Creatinine 0.97 mg/dL (H; Ref range: 9.48 - 9.04 mg/dL); eGFR BY CREAT 58 (!; Ref range: ); Potassium 4.3 mmol/L (Ref range: 3.3 - 5.1 mmol/L). Last LFTs was  07/01/2023: ALT 17 U/L (Ref range: 0 - 35 U/L); AST 30 U/L (Ref range: 0 - 35 U/L). Last Lipid panel was 07/01/2023: Cholesterol 209 mg/dL (!; Ref range: mg/dL); HDL 66 mg/dL (H; Ref range: 40 - 60 mg/dL); LDL Calculated 874 mg/dL (Ref range: mg/dL).Last A1C was 07/01/2023: Hemoglobin A1C 5.7 % (H; Ref range: <=5.6 %).Last TFT was 06/26/2022: TSH 2.25 uIU/mL (Ref range: 0.27 - 4.20 uIU/mL), No results found for requested labs within last 3650 days..[name]

## 2023-09-27 ENCOUNTER — Other Ambulatory Visit: Payer: Self-pay | Admitting: Family Medicine

## 2023-09-27 ENCOUNTER — Ambulatory Visit
Admission: RE | Admit: 2023-09-27 | Discharge: 2023-09-27 | Disposition: A | Discharge status: Home / Self Care | Payer: Medicare (Managed Care) | Source: Ambulatory Visit | Attending: Family Medicine | Admitting: Family Medicine

## 2023-09-27 ENCOUNTER — Ambulatory Visit: Payer: Self-pay | Admitting: Family Medicine

## 2023-09-27 DIAGNOSIS — Z803 Family history of malignant neoplasm of breast: Secondary | ICD-10-CM

## 2023-09-27 DIAGNOSIS — Z1231 Encounter for screening mammogram for malignant neoplasm of breast: Secondary | ICD-10-CM | POA: Insufficient documentation

## 2023-09-27 DIAGNOSIS — R92323 Mammographic fibroglandular density, bilateral breasts: Secondary | ICD-10-CM

## 2023-10-07 ENCOUNTER — Other Ambulatory Visit: Payer: Self-pay

## 2023-10-08 ENCOUNTER — Ambulatory Visit
Admission: RE | Admit: 2023-10-08 | Discharge: 2023-10-08 | Disposition: A | Payer: Medicare (Managed Care) | Source: Ambulatory Visit

## 2023-10-08 ENCOUNTER — Telehealth: Payer: Self-pay | Admitting: Family Medicine

## 2023-10-08 ENCOUNTER — Other Ambulatory Visit: Payer: Self-pay | Admitting: Family Medicine

## 2023-10-08 ENCOUNTER — Ambulatory Visit: Payer: Self-pay | Admitting: Family Medicine

## 2023-10-08 ENCOUNTER — Encounter: Payer: Self-pay | Admitting: Family Medicine

## 2023-10-08 ENCOUNTER — Ambulatory Visit: Payer: Medicare (Managed Care) | Attending: Family Medicine | Admitting: Family Medicine

## 2023-10-08 ENCOUNTER — Other Ambulatory Visit: Payer: Self-pay

## 2023-10-08 VITALS — BP 129/74 | HR 69 | Ht 65.0 in | Wt 137.0 lb

## 2023-10-08 DIAGNOSIS — M48062 Spinal stenosis, lumbar region with neurogenic claudication: Secondary | ICD-10-CM

## 2023-10-08 DIAGNOSIS — H9193 Unspecified hearing loss, bilateral: Secondary | ICD-10-CM | POA: Insufficient documentation

## 2023-10-08 DIAGNOSIS — K5900 Constipation, unspecified: Secondary | ICD-10-CM

## 2023-10-08 DIAGNOSIS — M810 Age-related osteoporosis without current pathological fracture: Secondary | ICD-10-CM | POA: Insufficient documentation

## 2023-10-08 DIAGNOSIS — I10 Essential (primary) hypertension: Secondary | ICD-10-CM

## 2023-10-08 DIAGNOSIS — Z23 Encounter for immunization: Secondary | ICD-10-CM | POA: Insufficient documentation

## 2023-10-08 DIAGNOSIS — Z0000 Encounter for general adult medical examination without abnormal findings: Secondary | ICD-10-CM | POA: Insufficient documentation

## 2023-10-08 DIAGNOSIS — E785 Hyperlipidemia, unspecified: Secondary | ICD-10-CM

## 2023-10-08 NOTE — Progress Notes (Signed)
 Physical Exam VisitMarie P Farmer is 83 y.o. female presenting for a full physical exam. History of Present IllnessThe patient is an 83 year old female who presents for her annual physical and wellness visit. She is up-to-date with her health screenings and recently received her annual influenza vaccine and COVID-19 booster. Blood work completed in 06/2023 was stable and at goal. She continues amlodipine  5 mg daily and hydrochlorothiazide  12.5 mg daily for management of hypertension, and simvastatin  20 mg daily for management of hyperlipidemia.She reports significant back pain, which necessitates the use of a walker at night due to discomfort when rising from bed to use the bathroom. The pain, described as similar to sciatica, has been a constant presence. There is no numbness or tingling in the legs. She reports feeling generally weaker in her legs but denies focal weakness. She fears of falling, particularly when navigating stairs. Her current exercise regimen includes daily chair yoga, but walking is limited due to the back pain. Pain management includes Tylenol , and she has considered returning to Dr. Simone for injections. She began experiencing stomach issues in 03/2023, managed with Metamucil and MiraLAX. However, she discontinued Metamucil in 06/2023 due to soft stools and lack of control. In the past week to 10 days, she has been dealing with uncontrollable flatulence and minor brown leakage which she has experienced before. She is currently not taking any medications and reports having three well-formed bowel movements today.She requires a new dental plate as she is unable to eat without it dislodging. She also reports difficulty chewing food properly.She has a lesion behind her eye and is seeing ophthalmology for this. She reports a decline in memory, often forgetting what she was saying mid-sentence. Her son has expressed concern about her driving due to this memory  loss.She has noticed a decrease in hearing and her children have suggested she get a hearing aid. However, she has not yet had her hearing checked.Living Condition: Recently moved from a townhouse to an apartment. Was lifting a lot of boxes which may have contributed to worsening back pain. ~~~ROS positive for: per aboveROS negative qnm:Lwpwuzwizi weight lossNight sweats Chest painNew CoughHematuria Pain or difficulty swallowingNew or worsening headachesVision changes Unusual bleeding or bruising Patient Active Problem List Diagnosis Code  AAA (abdominal aortic aneurysm) I71.40  Osteoporosis M81.0  Hyperlipidemia E78.5  GERD (gastroesophageal reflux disease) K21.9  Low back pain M54.50  Gallstones K80.20  Benign neoplasm of colon D12.6  Essential hypertension I10  Wears dentures Z97.2  Patient has healthcare proxy Z78.9  AAA (abdominal aortic aneurysm) without rupture - s/p EVAR & Right CFA endarterectomy 11/14/16 I71.40  Positive colorectal cancer screening using Cologuard test R19.5  Claustrophobia F40.240  Allergic rhinitis J30.9  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  psyllium (METAMUCIL) 95 % packet Take 1 packet by mouth daily.  hydroCHLOROthiazide  12.5 MG tablet Take 1 tablet (12.5 mg total) by mouth daily.  polyethylene glycol (GLYCOLAX,MIRALAX) 17 g powder packet Take by mouth daily  melatonin 3 mg Take 1 tablet (3 mg total) by mouth nightly as needed for Sleep.  Calcium  Carbonate-Vitamin D   (  CALCIUM  500 + D PO) Take 1 tablet by mouth 2 times daily  aspirin  81 MG tablet Take 1 tablet (81 mg total) by mouth daily Allergies Allergen Reactions  Fentanyl  Nausea And Vomiting  Oxycodone  Nausea And Vomiting   When in hospital for AAA repair  Environmental Allergies Other (See Comments)   Nasal congestion  Lipitor Cassidey.Cast ] Other (See Comments)   Elevated LFTs  Tramadol  Other (See Comments)   felt like a zombie Past Medical History: Diagnosis Date  AAA (abdominal aortic aneurysm)   Chronic pain   Elevated LFTs   Unclear if this was related to statin, gallstones, alcohol consumption   Essential hypertension 06/11/2016  GERD (gastroesophageal reflux disease)   High blood pressure   HLD (hyperlipidemia)   Hx of colonic polyps   Colonscopy 08/11/10; repeat 2017 per GI   Lumbar disc herniation 2006  L5-S1  Osteoporosis   Spondylosis   Urinary retention  Past Surgical History: Procedure Laterality Date  cataract repair Bilateral   COLONOSCOPY  2011  HYSTERECTOMY    left ovaries in place   TUBAL LIGATION   Social History Socioeconomic History  Marital status: Divorced Tobacco Use  Smoking status: Former   Packs/day: 1.00   Years: 30.00   Additional pack years: 0.00   Total pack years: 30.00   Types: Cigarettes   Quit date: 06/15/2013   Years since quitting: 10.3  Smokeless tobacco: Former Substance and Sexual Activity  Alcohol use: Yes   Alcohol/week: 7.0 standard drinks of alcohol   Types: 7 Glasses of wine per week   Comment: 1/day  Drug use: No  Sexual activity: Not Currently Social History Narrative  Lives alone currently in townhome   2 brothers and 2 sisters that live here  2 boys ([place], [place])  Grandchildren  Family History Problem Relation Name Age of Onset  Dementia Mother Hargis 72  Cancer Mother Evelyn       lung  High cholesterol Mother  Hargis   Stroke Mother Hargis   Parkinsonism Father John   Heart Disease Father John 59      passed  High cholesterol Father John   Heart attack Father John   Cancer Sister Patty       Breast; in recovery  High cholesterol Sister Patty   Hip fracture Sister Patty   Breast cancer Sister Patty 65  Cancer Brother John       prostate  High cholesterol Brother John   Diabetes Brother Ronald   Aneurysm Neg Hx    Anesthesia problems Neg Hx    Colon cancer Neg Hx    Colon polyps Neg Hx    Ovarian cancer Neg Hx   Review Flowsheet  More data exists   10/08/2023 07/23/2022 05/03/2021 01/17/2021 09/08/2020 02/17/2020 12/04/2019 PHQ Scores PHQ Q9 - Better Off Dead - 0  - - - - - PHQ Calculated Score 0 10 0 1 0 4 2  Details    Data saved with a previous flowsheet row definition   Aging Concerns Trouble Hearing - No  []  Yes  [x]  Details: interested in hearing assessment  Memory Concerns - No  []  Yes  [x]  Details: short term memory. This has worsened since last year.  Fall in past year - No  [x]  Yes  []  Details: Worsening Balance - No  []  Yes  [x]  Details: Legs chronically feel weak. Stairs are generally difficult. Limited exercise with LBP.  Environmental Safety  Parameter Yes No Sometimes NA Details Sunscreen  x   Recommend Smoke detectors x     CO detectors    x Public librarian  x     Bicycling Helmet     x  Health Care Maintenance Parameter UTD Need NA Declined Comment Dental Cleaning   x  dentures Vision Exam x     CRC screen   x    Dexa (F65+M70+) x     Mammogram (F40-75) x     Pap (F21-65)   x   PSA (M45AA/50+)   x   Abd US  (M65+)   x   Screening CT  >50 20pk yr   x   EKG   x   Lipid Panel  x     HbA1c x     STD screen   x   Hepatitis C    x  Tdap x     Prevnar/Pneumovax x     Prevnar-20   x   Shingrix    x  Flu x     Covid x     Covid booster x     RSV x     BP 129/74    Pulse 69   Ht 1.651 m (5' 5)   Wt 62.1 kg (137 lb)   LMP  (LMP Unknown)   BMI 22.80 kg/m GEN: Pleasant elderly female sitting in NAD. HEENT: Normocephalic and atraumatic. PERRL. Moist mucous membranes. TM's with non obstructing cerumen BL, normal landmarks. No pharyngeal erythema. PULM: Easy respirations, well aerated, CTA bilaterally. CVS: RRR, no murmur, normal S1& S2. ABD:  NABS, soft, nontender, non distendedSKIN: No concerning lesions on exposed skin NEURO: alert, oriented, CN II-XII intact, ambulates to and from exam table with caution when stepping upExt: no clubbing, edema or cyanosis Assessment/Plan 1. Preventative health care2. Annual physical exam (Primary)3. Spinal stenosis of lumbar region with neurogenic claudicationChronic condition, not at goal. Likely exacerbated by recent move. She is interested in returning to pain medicine for discussion of other treatment options, consider injections. Referred back to Dr. Simone today. - AMB REFERRAL TO [facility]4. ConstipationPatient has a history of constipation and previously has experienced fecal leakage due to this issue. Recommend KUB to assess stool burden. Next steps pending results. - Abdomen AP and oblique; Future5. Bilateral change in hearing- AMB REFERRAL TO [facility]UDIOLOGY - [facility]6. Essential hypertensionBP is at goal of <140/90. Potassium was last checked on 07/01/2023 and was 4.88mmol/L, which is within acceptable limits. Creatinine was last checked on 07/01/2023 and was .97mg /dL, which is within acceptable limits. Medication regimen as of TODAY is as follows: Antihypertensive Medications        amLODIPine  (NORVASC ) 5 mg tablet (Taking) TAKE 1 TABLET BY MOUTH EVERY DAY  hydroCHLOROthiazide  12.5 MG tablet (Taking) Take 1 tablet (12.5 mg total) by mouth daily.  7. HyperlipidemiaChronic condition, stableContinue simvastatin  20 mg  daily8. Age-related osteoporosis without current pathological fractureContinue calcium , vitamin D  supplements. DEXA due 2026. 9. Immunization dueShe declines shingles vaccine.  Care Planning  Health Care Proxy: on file  Donald Perone, MD

## 2023-10-08 NOTE — Progress Notes (Signed)
 Visit performed as:        Office Visit, met with patient in personToday we reviewed and updated [name]'s smoking status, activities of daily living, depression screen, fall risk, medications and allergies. I have counseled the patient in the above areas. Subjective: Chief Complaint: [name] is a 83 y.o. female here for a/an Subsequent Annual Medicare Visit and Annual ExamIn general, [name] rates their overall health jd:[id number] Care Team:[name], Donald, MD as PCP - General (Family Medicine)[name], Diane HERO, MD as Consulting Provider (Cardiology)[name], Curly, MD as Consulting Provider (Physical Medicine and Rehabilitation)[name], Diane Kung, MD as Consulting Provider (Vascular Surgery)[name], Diane Rush, MD as Anesthesiologist (Pain Medicine)[name], Diane House, NP (Colorectal Surgery) Current Outpatient Medications on File Prior to Visit Medication Sig Dispense Refill  amLODIPine  (NORVASC ) 5 mg tablet TAKE 1 TABLET BY MOUTH EVERY DAY 90 tablet 1  simvastatin  (ZOCOR ) 20 mg tablet TAKE 1 TABLET BY MOUTH EVERY DAY WITH DINNER 90 tablet 3  famotidine  (PEPCID ) 20 mg tablet Take 1 tablet (20 mg total) by mouth 2 times daily as needed for Heartburn. 60 tablet 5  psyllium (METAMUCIL) 95 % packet Take 1 packet by mouth daily.    hydroCHLOROthiazide  12.5 MG tablet Take 1 tablet (12.5 mg total) by mouth daily. 90 tablet 1  polyethylene glycol (GLYCOLAX,MIRALAX) 17 g powder packet Take by mouth daily    melatonin 3 mg Take 1 tablet (3 mg total) by mouth nightly as needed for Sleep.    Calcium  Carbonate-Vitamin D  (CALCIUM  500 + D PO) Take 1 tablet by mouth 2 times daily    aspirin  81 MG tablet Take 1 tablet (81 mg total) by mouth daily 90 tablet 1  senna (SENOKOT) 8.6 mg tablet Take 2 tablets by mouth daily  for Constipation 60 tablet 2 No current facility-administered medications on file prior to visit. Allergies  Allergen Reactions  Fentanyl  Nausea And Vomiting  Oxycodone  Nausea And Vomiting   When in hospital for AAA repair  Environmental Allergies Other (See Comments)   Nasal congestion  Lipitor [Atorvastatin ] Other (See Comments)   Elevated LFTs  Tramadol  Other (See Comments)   felt like a zombie Patient Active Problem List  Diagnosis Date Noted  Essential hypertension 06/11/2016   Priority: High  Osteoporosis 10/15/2013   Priority: High   Fosamax  started 2015?  Low back pain 10/15/2013   Priority: High   Saw Dr Guillermina.   Benign neoplasm of colon 11/03/2013   Priority: Medium   Removed via colonscopy 2012; told to repeat 2017   AAA (abdominal aortic aneurysm) 10/15/2013   Priority: Medium   Due for repeat U/S 09/2016 ; Noted 06/02/13: 3.8X3.5 cm partially thrombosed; 03/2016 stable at 4.77 cm- continued surveillance with vascular surgery  Hyperlipidemia 10/15/2013   Priority: Medium  Wears dentures 06/11/2016   Priority: Low  Patient has healthcare proxy 06/11/2016   Priority: Low   [name] Goods (son), Avelina Mai (sister)   Gallstones 10/23/2013   Priority: Low   Noted U/S: 06/02/13: There is gallbladder sludge/crushed gallstones. No gallbladder wall thickening, no sonographic Murphy's sign. Common bile duct 4mm.   GERD (gastroesophageal reflux disease) 10/15/2013   Priority: Low  Vitelliform lesion of macula 03/27/2023   Imaging 3/27/2025OCT: 247 cmt flat OD, trace PED EF no fluid OD, 259 cmt vitelliform lesion at fovea OSPhotos: wnl OD, vitelliform lesion at fovea, no CNVM OSFAF: wnl OD, hyperFA at fovea of vitelliform lesion OSVitelliform lesion of macula of left eye- [name] seen on baseline acuity  and on amsler grid 11/2022- provided amsler grid again, instructions were reviewed, check daily and rtc stat if acute changes- stable on exam, no choroidal neovascularization, deposit may be  genetically related- eating healthy for the eyes and not smoking and avoiding UV light discussed- vision potential goodAstigmatism of both eyes with presbyopia - seen by Dr. Laurence and prescription dispensed - reprinted MRX 3/27/25Dry eyes, bilateral- Followed by Dr. Laurence, meibomian gland dysfunction- Has breuder mask, uses intermittently due to rash and headaches - AT in morning to improve near vision  Follow-up:8 m oct mac and FAF ou with Dr. Ewing in [facility] for re-evaluation, sooner if vision decreases or symptoms worsen. All findings have been discussed with the patient and any family or associates present and they have expressed understanding of this discussion and have had their questions addressed.   Stage 3a chronic kidney disease 04/13/2022  Dependent edema 04/13/2022  Mixed stress and urge urinary incontinence 11/07/2021  Allergic rhinitis 11/21/2017  Positive colorectal cancer screening using Cologuard test 09/25/2017  Claustrophobia 09/25/2017  AAA (abdominal aortic aneurysm) without rupture - s/p EVAR & Right CFA endarterectomy 11/14/16 10/26/2016 Past Medical History: Diagnosis Date  AAA (abdominal aortic aneurysm)   Chronic pain   Elevated LFTs   Unclear if this was related to statin, gallstones, alcohol consumption   Essential hypertension 06/11/2016  GERD (gastroesophageal reflux disease)   High blood pressure   HLD (hyperlipidemia)   Hx of colonic polyps   Colonscopy 08/11/10; repeat 2017 per GI   Lumbar disc herniation 2006  L5-S1  Osteoporosis   Spondylosis   Urinary retention  Past Surgical History: Procedure Laterality Date  cataract repair Bilateral   COLONOSCOPY  2011  HYSTERECTOMY    left ovaries in place   TUBAL LIGATION   Family History Problem Relation Name Age of Onset  Dementia Mother Hargis 28  Cancer Mother Hargis       lung  High cholesterol Mother Hargis    Stroke Mother Hargis   Parkinsonism Father John   Heart Disease Father John 71      passed  High cholesterol Father John   Heart attack Father John   Cancer Sister Patty       Breast; in recovery  High cholesterol Sister Patty   Hip fracture Sister Patty   Breast cancer Sister Patty 4  Cancer Brother John       prostate  High cholesterol Brother John   Diabetes Brother Ronald   Aneurysm Neg Hx    Anesthesia problems Neg Hx    Colon cancer Neg Hx    Colon polyps Neg Hx    Ovarian cancer Neg Hx   Social History Socioeconomic History  Marital status: Divorced Tobacco Use  Smoking status: Former   Packs/day: 1.00   Years: 30.00   Additional pack years: 0.00   Total pack years: 30.00   Types: Cigarettes   Quit date: 06/15/2013   Years since quitting: 10.3  Smokeless tobacco: Former Substance and Sexual Activity  Alcohol use: Yes   Alcohol/week: 7.0 standard drinks of alcohol   Types: 7 Glasses of wine per week   Comment: 1/day  Drug use: No  Sexual activity: Not Currently Social History Narrative  Lives alone currently in townhome   2 brothers and 2 sisters that live here  2 boys ([place], [place])  Grandchildren  Objective: Vital Signs: BP 129/74   Pulse 69   Ht 1.651 m (5' 5)   Wt 62.1 kg (137 lb)  LMP  (LMP Unknown)   BMI 22.80 kg/m  BMI: Body mass index is 22.8 kg/m.Vision Screening Results (Welcome visit only):No results found.Depression Screening Results:Review Flowsheet  More data exists   10/08/2023 07/23/2022 05/03/2021 01/17/2021 09/08/2020 02/17/2020 12/04/2019 PHQ Scores PHQ Q9 - Better Off Dead - 0  - - - - - PHQ Calculated Score 0 10 0 1 0 4 2  Details    Data saved with a previous flowsheet row definition   Promis Cat V1.0 - Depression  10/07/2023 12:13 PM EDT - Filed by Patient I felt depressed Rarely I felt like a failure Sometimes I felt hopeless  Rarely I felt worthless Rarely PROMIS Depression T-Score (range: 10 - 90)  57 (mild)   Opioid Use/DAST- 10 Screening Results: How many times in the past year have you used an illegal drug or used a prescription medication for nonmedical reasons?: 0 (10/08/2023  8:54 AM)Activities of Daily Living/Functional Screening Results:Is the person deaf or does he/she have serious difficulty hearing?: N (10/08/2023  8:53 AM)Is this person blind or does he/she have serious difficulty seeing even when wearing glasses?: N (10/08/2023  8:53 AM)Does this person have serious difficulty walking or climbing stairs?: N (10/08/2023  8:53 AM)Does this person have difficulty dressing or bathing?: N (10/08/2023  8:53 AM)*Shopping: Independent (10/08/2023  8:53 AM)*Farmer Keeping: Independent (10/08/2023  8:53 AM)*Managing Own Medications: Independent (10/08/2023  8:53 AM)*Handling Finances: Independent (10/08/2023  8:53 AM)Difficulty doing errands due to a physicial, mental or emotional condition: No (10/08/2023  8:53 AM)Difficulty remembering or making decisions due to a physicial, mental or emotional condition: No (10/08/2023  8:53 AM)Fall Risk Screening Results:Have you fallen in the last year?: No (10/08/2023  8:52 AM)Do you feel you are at risk for falling?: No (10/08/2023  8:52 AM)Assessment and Plan: Cognitive Function:Recall of recent and remote events appears:Normal  Advanced Care Planning:was discussed and the paperwork can be found in the scanned media section The following health maintenance plan was reviewed with the patient:Health Maintenance Topics with due status: Overdue   Topic Date Due  HIV Screening [place] Never done  Hepatitis C Screening [place] Never done  IMM-Zoster Never done Health Maintenance Topics with due status: Not Due   Topic Last Completion Date  IMM DTaP/Tdap/Td 06/11/2016  Depression Screen Yearly 10/08/2023  Fall  Risk Screening 10/08/2023 Health Maintenance Topics with due status: Completed   Topic Last Completion Date  IMM Pneumo: 50+ Years 12/10/2014  IMM-RSV (Pregnant, Increased Risk 60-74, and 75+) 02/12/2022  IMM-Influenza 09/26/2023  COVID-19 Vaccine 09/26/2023 Health Maintenance Topics with due status: Aged [employer] Date Due  IMM-Hepatitis B Vaccine Aged Out  IMM-HIB 0-5 Yrs or At-Risk Patients Aged Out  IMM-HPV 9-26 Yrs or Shared Decision (27-45 Yrs) Aged Out  IMM-MCV4 0-18 Yrs or At-Risk Patients Aged Out  IMM-Rotavirus 0-8 Months Aged Out  IMM-MenB (2 Plans: Shared decision & Increased Risk Plans) Aged Out This health maintenance schedule, identified risks, a list of orders placed today and patient goals have been provided to Earnie SHAUNNA Sage in the after visit summary. Plan for any concerns identified during screening or risk assessments:n/aAlison Krissia Schreier, MD

## 2023-10-08 NOTE — Telephone Encounter (Signed)
 Vaughn Pizza, MD to [facility]) (Selected Message)  10/08/23  4:09 PMOk to increase miralax to twice daily. Once symptoms are improved or if she develops significant diarrhea she can reduce back to daily. This message provided to patient, she verbalized understanding and agreed to plan, very appreciative

## 2023-10-08 NOTE — Telephone Encounter (Signed)
 Vaughn Pizza, MD to [facility] [phone]) (Selected Message)  10/08/23  1:10 PMResult NoteResults have been received for workup recently undertaken for:   Patient:  [name]         DOB:  [birth year]The results are moderate stool burdenOur office will communicate these results to patient via phone callNurse - can you let her know that her XR shows moderate stool burden? This is likely contributing to her gas, concerns discussed at appt today. Can you let her know she should restart a treatment for constipation, I usually recommend miralax. Thanks! Pizza Vaughn, MDOctober 7, 2025 1:09 PMSpoke to patient, message relayed from Dr Vaughn . Patient verbalized understanding and was wondering if she could try increasing her dose of Miralx (takes on capful daily in the am) before adding the metamucil, she really does not want to restart the metamucil if she get by without it. Please advise

## 2023-10-08 NOTE — Patient Instructions (Signed)
 Thank you for completing your Subsequent Annual Medicare Visit and Annual Exam with us  today. The purpose of this visits was un:Drmzzw for diseaseAssess risk of future medical problemsHelp develop a healthy lifestyleUpdate vaccinesGet to know your doctor in case of an illnessPatient Care Team:Derec Mozingo, Donald, MD as PCP - General (Family Medicine)Jacobson, Thedora HERO, MD as Consulting Provider (Cardiology)Everett, Curly, MD as Consulting Provider (Physical Medicine and Rehabilitation)Newhall, Rufus Kung, MD as Consulting Provider (Vascular Surgery)Carinci, Juliene Rush, MD as Anesthesiologist (Pain Medicine)White, Darcia House, NP (Colorectal Surgery) Medicare 5 Year PlanThe following items were identified as areas of concern during your screening today:High Blood Pressure (Hypertension) - This is a risk factor for Heart Attack, Stroke, Kidney Problems and Eye Problems. The Health Maintenance table below identifies screening tests and immunizations recommended by your health care team:Health Maintenance: These screening recommendations are based on [place]PSTF, [employer], and [place] state guidelines Topic Date Due . HIV Screening  Never done . Hepatitis C Screening  Never done . Shingles Vaccine (1 of 2) Never done . Depression - Yearly  10/07/2024 . Fall Risk Screening  10/07/2024 . DTaP/Tdap/Td Vaccines (2 - Td or Tdap) 06/12/2026 . Flu Shot  Completed . RSV Vaccine  Completed . Pneumococcal Vaccination  Completed . COVID-19 Vaccine  Completed . Hepatitis B Vaccine  Aged Out . HIB Vaccine  Aged Out . HPV Vaccine  Aged Out . Meningococcal Vaccine  Aged Out . Rotavirus Vaccine  Aged Out . Meningitis Vaccine  Aged Out In addition, goals and orders placed to address these recommendations are listed in the Today's Visit section.We wish you the best of health and look forward to seeing you again next year for your Annual  Medicare Wellness Visit. If you have any health care concerns before then, please do not hesitate to contact us .

## 2023-10-11 ENCOUNTER — Encounter: Payer: Self-pay | Admitting: Internal Medicine

## 2023-10-24 ENCOUNTER — Other Ambulatory Visit: Payer: Self-pay

## 2023-10-25 ENCOUNTER — Ambulatory Visit: Payer: Medicare (Managed Care) | Admitting: Pain Medicine

## 2023-10-25 ENCOUNTER — Other Ambulatory Visit: Payer: Self-pay

## 2023-10-25 ENCOUNTER — Encounter: Payer: Self-pay | Admitting: Pain Medicine

## 2023-10-25 VITALS — BP 146/83 | HR 71 | Temp 97.2°F | Resp 16

## 2023-10-25 DIAGNOSIS — M47816 Spondylosis without myelopathy or radiculopathy, lumbar region: Secondary | ICD-10-CM

## 2023-10-25 NOTE — Progress Notes (Unsigned)
 Anesthesiology [facility] is a confidential report written only for the purpose of professional communication.  Clients who wish to receive these findings are requested to contact the Pain Treatment Center at [facility] [phone]. [name] is a 83 y.o. year old female.DOB: 1942-10-22Primary Care Physician: Vaughn Pizza, MDOutpatient pain medication prescriber: Vaughn Pizza, MDChief Complaint: Lower Back PainConsult requested by: Vaughn Pizza, MD Reason for Consultation: Consult and TreatHistory of Present Illness:[name] is a 83 y.o. year-old female with hx of  has a past medical history of AAA (abdominal aortic aneurysm), Chronic pain, Elevated LFTs, Essential hypertension (06/11/2016), GERD (gastroesophageal reflux disease), High blood pressure, HLD (hyperlipidemia), colonic polyps, Lumbar disc herniation (2006), Osteoporosis, Spondylosis, and Urinary retention. who presents for initial evaluation of lower back pain.  The patient is a former patient of Dr. Simone at the pain treatment center.  The patient underwent a mild procedure in 2021 and had several epidural steroid injections with Dr. Guillermina.  The patient was last seen at the [facility] in 2022.  The patient returns today due to low back pain that has worsened over the last several years.Her pain is located in the lower back without radiation. The pain is present constantly with changes in intensity. She describes the pain as aching and sharp. Aggravating factors include walking and prolonged standing. Alleviating factors include rest. Patient denies numbness or weakness in bilateral lower extremities, saddle anesthesia, or changes to bowel/bladder function. She does have some chronic toe numbness Sleep: poor, unable to find comfortable position Mood: frustrated due to pain Appetite: stable Activities: limited by pain Physical Therapy/TENS: Spring  2025Social Yk:Dnrpjo History Tobacco Use  Smoking status: Former   Current packs/day: 0.00   Average packs/day: 1 pack/day for 30.0 years (30.0 ttl pk-yrs)   Types: Cigarettes   Start date: 06/16/1983   Quit date: 06/17/2013   Years since quitting: 10.3  Smokeless tobacco: Former  Tobacco comments:   The data in the grid above may be an average based on historical data and used for Lung Cancer Screening Eligibility.  If you find it inaccurate you can delete it and take a more accurate history. Substance Use Topics  Alcohol use: Yes   Alcohol/week: 7.0 standard drinks of alcohol   Types: 7 Glasses of wine per week   Comment: 1/day Pain scores:Pain rating 9-10 at worstActivity Level: - Independent mobility with walker- ADL performance without assistance.Treatment HistoryI. PharmacologicalA) Present Pain Medications & Effectiveness- --Current Outpatient Medications Medication  amLODIPine  (NORVASC ) 5 mg tablet  simvastatin  (ZOCOR ) 20 mg tablet  psyllium (METAMUCIL) 95 % packet  hydroCHLOROthiazide  12.5 MG tablet  polyethylene glycol (GLYCOLAX,MIRALAX) 17 g powder packet  Calcium  Carbonate-Vitamin D  (CALCIUM  500 + D PO)  aspirin  81 MG tablet  famotidine  (PEPCID ) 20 mg tablet  melatonin 3 mg No current facility-administered medications for this visit. Anticoagulation meds:- ASA 81II. Interventional- MILD- ESI- iSTOP Reference # [id number] B) Historical Experience with Pain Related Medications/ Therapies/Interventions & Effectiveness:Analgesics:     Not Tried TriedNot Effective TriedSide Effects TriedEffective Comments Acetaminophen  (Tylenol ) []  [x]  []  []   Tramadol  (Ultram ) [x]  []  []  []     Tapentadol (Nucynta) [x]  []  []  []     Morphine [x]  []  []  []     Hydromorphone  (Dilaudid ) [x]  []  []  []     Hydrocodone-Acetaminophen  (Vicodin/Norco) [x]  []  []  []    Oxycodone -Acetaminophen   (Percocet) [x]  []  []  []     Methadone [x]  []  []  []    Buprenorphine (Buprenex PO) [x]  []  []  []     Buprenorphine  Patch (Butrans) [x]  []  []  []     Fentanyl  Patch [x]  []  []  []     Lidocaine  Patch/Gel [x]  []  []  []     Flector Patch [x]  []  []  []     Other:   NSAIDS:    Not Tried TriedNot Effective TriedSide Effects TriedEffective Comments Ibuprofen []  [x]  [x]  []    Naproxen  [x]  []  []  []     Etodolac [x]  []  []  []     Diclofenac [x]  []  []  []     Nabumetone [x]  []  []  []     Meloxicam (Mobic) [x]  []  []  []     Celecoxib  (Celebrex ) [x]  []  []  []     Voltaren gel 1%  [x]  []  []  []   Other:  Neuromodulators:    Not Tried TriedNot Effective TriedSide Effects TriedEffective Comments Gabapentin  (Neurontin ) []  [x]  []  []     Pregabalin (Lyrica) [x]  []  []  []     Other:  Re-Uptake inhibitors:   Not Tried TriedNot Effective TriedSide Effects TriedEffective Comments Venlafaxine (Effexor) [x]  []  []  []    Duloxetine (Cymbalta) [x]  []  []  []     Other:   Antidepressants:   Not Tried TriedNot Effective TriedSide Effects TriedEffective Comments Amitriptyline (Elavil) [x]  []  []  []     Nortriptyline (Pamelor) [x]  []  []  []     Other:   Skeletal Muscle Relaxants:    Not Tried TriedNot Effective TriedSide Effects TriedEffective Comments Cyclobenzaprine  (Flexiril) [x]  []  []  []     Methocarbamol (Robaxin) [x]  []  []  []     Metaxalone (Skelaxin) [x]  []  []  []     Baclofen [x]  []  []  []     Topiramate (Topamax) [x]  []  []  []     Tizanidine  ( Zanaflex ) [x]  []  []  []   Chlorzoxazone ( Lorzone) [x]  []  []  []   Other:   PT:    Not Tried TriedNot Effective TriedSide Effects TriedEffective Comments PT - Land Based []  []  []  [x]    PT - Water  Based [x]  []  []  []     TENS/Stimulator [x]  []  []  []     Exercise Treadmill/Bike [x]  []  []  []    Other:   Invasive modalities:    Not Tried TriedNot Effective TriedSide Effects TriedEffective Comments  Nerve Block [x]  []  []  []     Joint Injection [x]  []  []  []     Epidural [x]  []  []  []     Bursae Injection [x]  []  []  []     SCS [x]  []  []  []     Other:   Alternative:    Not Tried TriedNot Effective TriedSide Effects TriedEffective Comments Acupuncture [x]  []  []  []     Massage [x]  []  []  []     Magnet Therapy [x]  []  []  []     Chiropractor [x]  []  []  []      II. Other Consultants: - PM&RIII. Prior Investigations/Imaging:Images personally reviewed, summary of report findings: 05/06/19: Lumbar spine CT w/o contrast  For purposes of numeration, 5 lumbar vertebral segments are designated. Mild levocurvature of the thoracolumbar spine is observed, with leftward apex at L1-L2. Straightening of the lumbar lordosis is present. The heights of the vertebral bodies are preserved. No acute lumbosacral fracture is identified.  2 mm retrolisthesis of L1 on L2 is noted. Severe intervertebral disc space narrowing is observed at L3-L4 through L5-S1. Review of the individual levels reveals the following: T12-L1:  No disc herniation, spinal stenosis or foraminal narrowing. L1-L2:  A disc bulge and vertebral osteophytes deform the ventral thecal sac. Bilateral facet hypertrophy and ligamentum flava thickening. No spinal stenosis. Mild right foraminal narrowing. L2-L3:  A disc bulge deforms the ventral thecal sac. Bilateral facet hypertrophy and ligamentum flava thickening. Mild spinal stenosis.  No foraminal narrowing. L3-L4:  A disc bulge and vertebral osteophytes efface the ventral thecal sac. Bilateral facet hypertrophy and ligamentum flava thickening. Moderate spinal stenosis. Mild left foraminal narrowing. L4-L5:  A disc bulge and vertebral osteophytes efface the ventral thecal sac. Severe spinal stenosis and bilateral lateral recess stenoses. Mild-moderate bilateral foraminal narrowing. L5-S1:  A disc bulge and vertebral osteophytes efface the ventral epidural fat.  Bilateral facet hypertrophy and ligamenta flava thickening. Mild spinal stenosis. Severe right and moderate left foraminal narrowing. An aortoiliac endograft stent is in place for the aortic aneurysm. 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 Past Medical HistoryPast Medical History[1]Past Surgical HistoryPast Surgical History[2]Family HistoryFamily History[3]Allergies: Allergies[4]Review Of Systems:  A comprehensive review of 10 systems was performed and reviewed with the patient. Please refer to scanned intake form for details.Denies fevers, chills, unintentional weight loss, bowel/bladder incontinence, weakness, or sensory changes.Physical Exam:There were no vitals filed for this visit. Vital Signs: Vitals:  10/25/23 0956 BP: 146/83 Pulse: 71 Resp: 16 Temp: 36.2 C (97.2 F)   General: No apparent distress sitting in examination roomResp: breathing comfortablyCVS: warm, well perfusedMusculoskeletal Examination: Gait: antalgicNeurologic: Mental status: alert, awake, oriented x3.ROM:- Lumbar extension limited secondary to pain- AROM lumbar flexion within functional limitsLumbar Spine examination:   Right Left  Inspection no gross deformity, skin changes or swelling no gross deformity, skin changes or swelling  Tenderness to Palpation Over paraspinals bilaterally   Muscle Tone normal normal  Patellar reflexes 2+ 2+  Achilles reflexes 2+ 2+  Motor Strength   L2 Hip Flexors 4 5 L3 Knee extensors 5 5 L4 Ankle dorsiflexors 5 5 L5 Extensor hallucis longus 5 5 S1 Ankle plantarflexors 5 5      Straight leg raise test - -  Lumbar facet loading  + +  FABER  - -  FADIR - -  Single leg stance Un able to perform Un able to perform      LT sensation  Intact lower extremity  Intact lower extremity  Lab Results:  Chemistry  Lab Results  Component Value Date/Time  Sodium 137 07/01/2023 0814  Potassium 4.3 07/01/2023 0814  Chloride 99 07/01/2023 0814  CO2 23 07/01/2023 0814  Anion Gap 15 07/01/2023 0814  UN 14 07/01/2023 0814  GFR,Caucasian 48 (!) 09/21/2019 0929  GFR,Black 55 (!) 09/21/2019 0929  Glucose 103 (H) 07/01/2023 0814  Calcium  10.0 07/01/2023 0814  Total Protein 6.9 07/01/2023 0814  Albumin 4.1 06/18/2013 0000  CRP 4 10/09/2017 1050  Vitamin B12 406 05/16/2021 0849 Coags:  Lab Results Component Value Date/Time  Protime 11.7 11/05/2016 1442  INR 1.0 11/05/2016 1442 Endocrine:  Lab Results Component Value Date/Time  TSH 2.25 06/26/2022 0810  Hemoglobin A1C 5.7 (H) 07/01/2023 0814 Immunology:  No results found for: ANIFA, HLB27, RFDHematology:  Lab Results Component Value Date/Time  WBC 5.4 07/09/2023 0812  RBC 4.5 07/09/2023 0812  Hemoglobin 13.4 07/09/2023 0812  Hematocrit 42 07/09/2023 0812  MCV 92 07/09/2023 0812  RDW 13.1 07/09/2023 0812  Neut # K/uL 3.1 07/09/2023 0812  Lymph # K/uL 1.5 07/09/2023 0812  Mono # K/uL 0.6 07/09/2023 0812  Eos # K/uL 0.1 07/09/2023 0812  Baso # K/uL 0.1 07/09/2023 0812  Sedimentation Rate 9 09/21/2019 0929 Urine Tox:  No results found for: AMPU, BZDU, THCU, COCMU, FENT, EDDPU, COPS, UREMK, THC, COC, OPIUC, MTD, OXYOpioid Risk Assessment: Female Female Family History of Substance Abuse   Alcohol []  1 []  3 Illegal Drugs []  2 []   3 Rx Drugs []  4 []  4 Personal History of Substance Abuse   Alcohol []  3 []  3 Illegal Drugs []  4 []  4 Rx Drugs []  5 []  5    Age Between 65-45 []  1 []  1    History of Preadolescent Sexual Abuse []  3 []  0 Pyschologic Disease   ADD, OCD, Bipolar, Schizoprenia []  2 []  2 Depression [x]  1 []  1 Total = 1Low risk 0-3 Moderate risk 4-7 High risk >8 Assessment/Plan: [name] is a 83 y.o. female with PMHx  significant for  has a past medical history of AAA (abdominal aortic aneurysm), Chronic pain, Elevated LFTs, Essential hypertension (06/11/2016), GERD (gastroesophageal reflux disease), High blood pressure, HLD (hyperlipidemia), colonic polyps, Lumbar disc herniation (2006), Osteoporosis, Spondylosis, and Urinary retention. who presents for initial evaluation of lower back pain.The patient continues to endorse severe axial low back pain. The pain is centered in the axial low back. The pain does not radiate into the leg. There is no radicular component.  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 Plan as follows:-  We are requesting b/l lumbar MBB L4-5, L5-S1 x 2 - with goal of RFA with the goal of improving the patient's overall condition. The patient has not responded well to physical therapy. Given that this patient has failed multiple modalities in attempt to alleviate their pain, we believe this patient is an excellent candidate for this procedure, which will provide this patient with more long-term pain relief, improved quality of life, decreased reliability on medication, increased mobility and function. -  We would like to make the following medication recommendations, but we will defer these recommendations to the patient's referring provider for centralization of medication management. We would recommend: -  Continue current pharmacological management as prescribed-  Recommend continuing home physical therapy to focus on stabilization, ROM, stretching, strengthening of lumbar spine. Focus on Trudy' flexion-based to neutral therapy. Ok to use modalities heat/ice/US /TENS as tolerated.INTERVENTION:Letter of Medical Necessity:[name] is being treated for facetogenic pain. We are requesting prior authorization for a b/l lumbar MBB L4-5, L5-S1 x 2 and eventually RFA.This will be performed under fluoroscopy and  RFA performed via non-pulsed thermal RFA/pulsed thermal RFA. -The date the patient's pain started on: Years ago-The patient's pain is secondary to: facet arthropathy-The patient's pain score is 9-10/10-The patient's imaging results are as reported above.The pain has been refractory to conservative measures including:- More than three months of conservative treatment with rest.- More than three months of reduced activity. - Trial of physical therapy/chiropractic care, Dates: Spring 2025 and continues with HE.- Trial of medications for at least 3 months including: tylenol , oral NSAIDs, lidocaine  patch, gabapentin  in the past- Patient has failed to respond to conservative therapy including all of the following >6 weeks physical therapy/chiropractic and prescribed home exercise program,  and NSAID > 3 weeks  and > 6 weeks activity modification.-Impact on activities of daily living: The pain is significantly impacting quality life and ability to participate in activities of daily living including; cooking, cleaning, household chores, pursuit of hobbies.- The patient has not had this procedure before.  She has axial low back pain without radiation.  The patient has CT findings of facet arthropathy at the levels requested.The clinical findings and imaging studies suggest no other obvious cause of the pain. The history, physical examination, and clinical correlation was performed for the diagnosis of this pain syndrome by an experienced pain medicine provider.Therefore, we feel that performing this  intervention will not only improve the patient's overall mobility and functional status, but it will also minimize their reliance on medications, as well as drastically improve their quality of life.- The procedure, risks, benefits, and alternatives were discussed with the patient. There was adequate time for questions. The patient provided verbal understanding and agreement with the proposed  treatment plan. - Please note that the [facility] is a comprehensive, multidisciplinary pain treatment program that includes coordination of physical therapy, patient education, psychosocial support, oral medication and injection based therapies. The patient is therefore participating in a comprehensive multidisciplinary pain treatment program that includes all of the following: Physical therapy, patient education, psychosocial support, oral medication and injection based therapy. Follow up:For procedureThank you for allowing us  the opportunity to care for this patient.Patient was discussed, examined, and treatment plan agreed upon with the attending, Dr. Simone.Signed: Arvilla Anes, DO Pain Medicine Fellow 10/25/2023  [1] Past Medical History:Diagnosis Date  AAA (abdominal aortic aneurysm)   Chronic pain   Elevated LFTs   Unclear if this was related to statin, gallstones, alcohol consumption   Essential hypertension 06/11/2016  GERD (gastroesophageal reflux disease)   High blood pressure   HLD (hyperlipidemia)   Hx of colonic polyps   Colonscopy 08/11/10; repeat 2017 per GI   Lumbar disc herniation 2006  L5-S1  Osteoporosis   Spondylosis   Urinary retention  [2] Past Surgical History:Procedure Laterality Date  cataract repair Bilateral   COLONOSCOPY  2011  HYSTERECTOMY    left ovaries in place   TUBAL LIGATION   [3] Family HistoryProblem Relation Name Age of Onset  Dementia Mother Hargis 92  Cancer Mother Hargis       lung  High cholesterol Mother Hargis   Stroke Mother Hargis   Parkinsonism Father John   Heart Disease Father John 35      passed  High cholesterol Father John   Heart attack Father John   Cancer Sister Patty       Breast; in recovery  High cholesterol Sister Patty   Hip fracture Sister Patty   Breast cancer Sister Patty 68  Cancer Brother John       prostate  High  cholesterol Brother John   Diabetes Brother Ronald   Aneurysm Neg Hx    Anesthesia problems Neg Hx    Colon cancer Neg Hx    Colon polyps Neg Hx    Ovarian cancer Neg Hx   [4] AllergiesAllergen Reactions  Fentanyl  Nausea And Vomiting  Oxycodone  Nausea And Vomiting   When in hospital for AAA repair  Environmental Allergies Other (See Comments)   Nasal congestion  Lipitor [Atorvastatin ] Other (See Comments)   Elevated LFTs  Tramadol  Other (See Comments)   felt like a zombie

## 2023-10-30 ENCOUNTER — Telehealth: Payer: Self-pay

## 2023-10-30 NOTE — Telephone Encounter (Signed)
Bilateral L4/5, L5/S1 MBB # 1

## 2023-11-02 ENCOUNTER — Other Ambulatory Visit: Payer: Self-pay | Admitting: Family Medicine

## 2023-11-02 DIAGNOSIS — I10 Essential (primary) hypertension: Secondary | ICD-10-CM

## 2023-11-04 MED ORDER — HYDROCHLOROTHIAZIDE 12.5 MG PO TABS *I*
12.5000 mg | ORAL_TABLET | Freq: Every day | ORAL | 1 refills | Status: AC
Start: 2023-11-04 — End: ?

## 2023-11-04 NOTE — Telephone Encounter (Signed)
 [facility] Refill Note11/03/25Marie GREIDY SHERARD  07/26/1940  MRN: [id number] Last office visit: 10/7/2025Last telehome visit: Visit date not foundLast APP visit:  Visit date not foundRecent VisitsDate Type Provider Dept 10/08/23 Office Visit Schuppert, Donald, MD [facility] Showing recent visits within past 185 days in an active department and meeting all other requirementsFuture AppointmentsNo visits were found meeting these conditions.Showing future appointments within next 0 days in an active department and meeting all other requirementsOpioid Drug Screen:No results for input(s): AMPU, BEU, OPSU, OXYU, THCU, BZDU, COPS, CTHC, UCRNC, TRAMD in the last 8760 hours.LABORATORY DATA:Last Renal Func was  07/01/2023: Creatinine 0.97 mg/dL (H; Ref range: 9.48 - 9.04 mg/dL); eGFR BY CREAT 58 (!; Ref range: ); Potassium 4.3 mmol/L (Ref range: 3.3 - 5.1 mmol/L). Last LFTs was  07/01/2023: ALT 17 U/L (Ref range: 0 - 35 U/L); AST 30 U/L (Ref range: 0 - 35 U/L). Last Lipid panel was 07/01/2023: Cholesterol 209 mg/dL (!; Ref range: mg/dL); HDL 66 mg/dL (H; Ref range: 40 - 60 mg/dL); LDL Calculated 874 mg/dL (Ref range: mg/dL).Last A1C was 07/01/2023: Hemoglobin A1C 5.7 % (H; Ref range: <=5.6 %).Last TFT was 06/26/2022: TSH 2.25 uIU/mL (Ref range: 0.27 - 4.20 uIU/mL), No results found for requested labs within last 3650 days..Donise Woodle

## 2023-11-04 NOTE — Telephone Encounter (Signed)
 Good afternoon,    The patients plan has approved the PAR and the flowsheet updated.    Thank you,  Jasher Barkan

## 2023-11-07 NOTE — Progress Notes (Signed)
 [facility] [address] [place], [address],  [place]  [address]Eynwz: [phone], Fax: [phone] Patient: [name] MR Number: [id number] Date of Birth: 07-Feb-1940   Hearing Aid Insurance Benefit Coverage FormDate insurance was checked 11/07/23 Representative Name: Batric @ [employer] Does patient have a hearing aid benefit with their policy: Yes (The patient has benefits through [employer], which our office does not par with. The patient should call ins to find a provider that works with that vendor. Pt can be seen here and pay out of pocket.) Is hearing aid benefit subject to a deductible?   Deductible Amount $?   Is there a dollar amount maximum for the hearing aid benefit?   Maximum Amount ($)   Is the patient responsible for the balance of the cost of the hearing aids if the benefit doesn't cover the full cost?   Is there additional coverage for evaluation?   Is there additional coverage for earmolds?   Is there additional coverage for fitting fees?   Is the patient currently eligible for the hearing aid benefit?   Is there an out of pocket maximum for patient's insurance?   Out of pocket maximum amount?   Does the patient have any additional health insurance coverage?   What is the additional insurance coverage plan name?   Additional Notes: The patient has benefits through [employer], which our office does not par with. The patient should call ins to find a provider that works with that vendor. Pt can be seen here and pay out of pocket.Reference Number: [id number] Reviewed by patient:  Initials:________  Date:___________**This benefit quote reflects your insurance current plan year only.INSURANCE QUOTE- Explanation of Benefits (EOB) comes from your insurance NOT from [facility] PATIENT ACCOUNTS.  This may not accurately reflect your true hearing aid benefit.

## 2023-11-08 ENCOUNTER — Encounter: Payer: Medicare (Managed Care) | Admitting: Pain Medicine

## 2023-11-13 ENCOUNTER — Other Ambulatory Visit: Payer: Self-pay

## 2023-11-14 ENCOUNTER — Ambulatory Visit: Payer: Medicare (Managed Care) | Admitting: Family Medicine

## 2023-11-14 ENCOUNTER — Encounter: Payer: Self-pay | Admitting: Gastroenterology

## 2023-11-14 ENCOUNTER — Encounter: Payer: Self-pay | Admitting: Family Medicine

## 2023-11-14 VITALS — BP 120/71 | HR 68 | Ht 65.0 in | Wt 137.0 lb

## 2023-11-14 DIAGNOSIS — G3184 Mild cognitive impairment, so stated: Secondary | ICD-10-CM | POA: Insufficient documentation

## 2023-11-14 DIAGNOSIS — R21 Rash and other nonspecific skin eruption: Secondary | ICD-10-CM

## 2023-11-14 DIAGNOSIS — K5900 Constipation, unspecified: Secondary | ICD-10-CM

## 2023-11-14 DIAGNOSIS — M47816 Spondylosis without myelopathy or radiculopathy, lumbar region: Secondary | ICD-10-CM | POA: Insufficient documentation

## 2023-11-14 NOTE — Progress Notes (Signed)
 [facility] [name] is a 83 y.o. female who presents for Memory LossHistory of Present IllnessThe patient is an 83 year old female who presents for concerns regarding memory loss and completion of a MoCA cognitive assessment today.She reports experiencing memory lapses during conversations, often forgetting the topic mid-sentence. Additionally, she mentions a long-standing issue with comprehension, dating back to her school years, where she struggled to recall information from paragraphs she had read. There is a family history of Alzheimer's dementia, with her sister being affected by the condition.She has been experiencing soft stools, which she describes as not runny but lacking form and adhering to the bottom of the toilet bowl. Despite discontinuing the evening dose of MiraLAX, she continues to have bowel movements, albeit with slightly more formed stools. She expresses interest in trying a probiotic supplement she discovered online, which claims to aid in weight loss, alleviate itching, and ensure regular bowel movements. She plans to monitor her morning bowel movements for the next few weeks before deciding on the supplement. She notes a slight reduction in gas symptoms, although not completely resolved.She continues to experience ankle swelling and has noticed the development of spots on both legs. These spots are neither itchy nor painful. She has been applying a cream to the affected areas and has been using knee-high socks for support. She expresses plans to discuss with her dermatologist. Objective Blood pressure 120/71, pulse 68, height 1.651 m (5' 5), weight 62.1 kg (137 lb), SpO2 (P) 99%.Physical ExamGeneral appearance: Well appearing, in NAD.Cardiovascular:  Warm and well perfused.Pulmonary:  Non-labored breathing.Skin: Warm and dry. Few hypopigmented macules on lower right shin. Neuro:  Alert and oriented. Ext: trace edema BLE  noted near sock line. ResultsDiagnostic Testing - MoCA cognitive assessment: 11/14/2023, Score is 19 out of 30  Assessment & Plan1. Mild cognitive impairment (Primary)Symptoms, MoCA completed today are c/w mild cognitive impairment. She scored 19/30 on MoCA today, struggling with delayed recall, naming, and language. Discussed option of additional assessment, testing to assess for possible Alzheimer's disease, particularly in light of family history. A referral to a memory clinic was discussed, but she declined at this time. She will inform us  if she changes her mind.The majority of today's visit was spent on above assessment and discussion of results. Additional concerns discussed briefly as follows: 2. ConstipationContinue miralax. She is considering a supplement advertised online. Reviewed lack of data to support use of supplements for managing constipation.The potential risks of supplements, including liver stress and medication interactions, were discussed.3. Lumbar spondylosisContinue care per Dr. Simone, planning for procedure. 4. Rash and nonspecific skin eruptionPatient with few hypopigmented macules on LE. She will discuss with dermatology. She has some erythema around shin with trace edema suggestive of venous stasis dermatitis. She was advised to continue moisturizing and consider wearing compression socks or knee-high socks to help support venous return. I personally spent 32 minutes on the calendar day of the encounter, including pre and post visit work. Author: Donald Perone, MD  Note signed: 11/14/2023

## 2023-11-19 ENCOUNTER — Other Ambulatory Visit: Payer: Self-pay

## 2023-11-20 ENCOUNTER — Ambulatory Visit: Payer: Medicare (Managed Care) | Attending: Family Medicine | Admitting: Audiologist

## 2023-11-20 DIAGNOSIS — H903 Sensorineural hearing loss, bilateral: Secondary | ICD-10-CM | POA: Insufficient documentation

## 2023-11-20 NOTE — Progress Notes (Unsigned)
 AUDIOLOGIC [facility]ALUATION [facility] [address], [address],  [place]  [address]Eynwz: [phone], Fax: [phone] Outpatient VisitPatient: [name] MR Number: [id number] Date of Birth: 1940/05/08 Date of Visit: 11/20/2023  PURE-TONE TEST RESULTSType of Testing: conventional    Test Reliability: goodTransducer: headphone, boneBooth: 1ANSI S3.21.2004 (R2009) Air Conduction Testing (dB HL and kHz) LEFT EAR RIGHT EAR 0.125 0.25 0.50  0.75 1.0 1.5 2.0 3.0 4.0 6.0 8.0  0.125 0.25 0.50 0.75 1.0 1.5 2.0 3.0 4.0 6.0 8.0   30 40   50   45 50 60 50 85      30 35   45   45 50 55 55 80                                                                 Bone Conduction Testing (dB HL and kHz)Bone conduction was unmasked/unspecified  0.25 0.50  0.75 1.0 1.5 2.0 3.0 4.0     0.25 0.50 0.75 1.0 1.5 2.0 3.0 4.0                        20 40   40   40   50                                                                  SPEECH AUDIOMETRY SAT SRT Score dB HL EML  Test  SAT SRT Score dB HL EML  Test     92 % 75 45 W-22 CD      92 % 75 45 W-22 CD EML   EML            EML   EML           Please refer to scanned [facility] [id number] MR for additional information NotesThreshold in dB HLFrequency in kiloHertz (kHz) Legend dB=decibelsHL=Hearing LevelNR=No ResponseVT=Vibro-Tactile EML=Effective Masking LevelSAT=Speech Awareness ThresholdSRT=Speech Reception  ThresholdMLV=Monitored Live VoiceCD=Compact Disk HISTORY: Referred by Donald Perone, MD for an audiological evaluation to determine whether patient is a candidate for medical, surgical or rehabilitative services.  Patient was accompanied to today's appointment by her sister, [name].  Patient reports increased difficulty hearing over the past eight years, especially in conversation and while watching television.  She states her family members have raised  concerns about her hearing.  Patient denies tinnitus and dizziness at this time.  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  Speech recognition ability in quiet was excellent, bilaterally, when speech was presented at a loud but comfortable level.Test results were reviewed with the patient.  She is considered a candidate for amplification and is interested in pursuing hearing aids at this time.HEARING AID EVALUATION:Hearing difficulties were noted in the following situations/environments:-conversing with family members, friends, and groups -one-on-one conversation-television Hearing aid styles, technology, and realistic expectations were discussed.  The patient was counseled regarding various levels of technology, appropriate hearing aid style for their hearing loss, monaural versus binaural  hearing aid use including the benefits of binaural hearing and limitations of monaural hearing, benefits and limitations of amplification, and the hearing aid adaption process.  The patient has 100% coverage through the [facility] Financial Assistance Program (FAP) and meets criteria for a monaural hearing aid at this time.  She signed the form indicating she chooses to proceed with a hearing aid through FAP and waive any insurance benefit.The patient was provided with a price estimate sheet with associated, non-refundable hearing aid service fees and was reminded of the 45-day trial period.  An earmold impression was taken of the right side. The following hearing aid is recommended for the right ear and will be ordered pending medical clearance: Phonak Naida L30 PR BTE.RECOMMENDATIONS: Audiological re-evaluation as per Dr. Vaughn.[name], AuD, CCC-AAudiologistUR [facility]

## 2023-11-25 ENCOUNTER — Other Ambulatory Visit: Payer: Self-pay | Admitting: Pain Medicine

## 2023-11-25 DIAGNOSIS — R52 Pain, unspecified: Secondary | ICD-10-CM

## 2023-12-04 ENCOUNTER — Other Ambulatory Visit: Payer: Self-pay

## 2023-12-05 ENCOUNTER — Encounter: Payer: Self-pay | Admitting: Pain Medicine

## 2023-12-05 ENCOUNTER — Ambulatory Visit
Admission: RE | Admit: 2023-12-05 | Discharge: 2023-12-05 | Disposition: A | Payer: Medicare (Managed Care) | Source: Ambulatory Visit | Attending: Pain Medicine | Admitting: Pain Medicine

## 2023-12-05 ENCOUNTER — Ambulatory Visit: Payer: Medicare (Managed Care) | Attending: Pain Medicine | Admitting: Pain Medicine

## 2023-12-05 ENCOUNTER — Other Ambulatory Visit: Payer: Self-pay

## 2023-12-05 VITALS — BP 148/67 | HR 68 | Temp 98.1°F | Resp 16 | Ht 65.0 in | Wt 136.9 lb

## 2023-12-05 DIAGNOSIS — M47816 Spondylosis without myelopathy or radiculopathy, lumbar region: Secondary | ICD-10-CM | POA: Insufficient documentation

## 2023-12-05 DIAGNOSIS — R52 Pain, unspecified: Secondary | ICD-10-CM

## 2023-12-05 DIAGNOSIS — M545 Low back pain, unspecified: Secondary | ICD-10-CM | POA: Insufficient documentation

## 2023-12-05 NOTE — Patient Instructions (Addendum)
 [facility]  Facet Joint Injection  Discharge Instructions        Your doctor has given you a facet joint injection (s).      [x]  You have received a diagnostic facet injection (s).  This means you have received numbing medicine only at the site(s) of your pain.  This will wear off over the next 1 to 12 hours.  It is important that you keep a pain diary to help us  know how this injection has helped you.      After the injection you may notice the following:    1. Increased tenderness in the area of your usual pain  2. Mild extremity weakness    These effects are temporary and will diminish over the next several days.    For injection site tenderness, apply an ice pack to the injection site for a 20 minute period, then remove for one hour.  Repeat as necessary.    Please notify the Pain Treatment Center if you notice:    1. Redness, swelling, or drainage from the injection sites, or have a fever.  2. Prolonged extremity weakness (lasting more than 3 days)    Activity:  You may carry out your regular daily activities, avoid doing things that you have not done for some time (example: heavy housework or lifting etc).  It is very important you continue your regular exercise program.  If you have received any medications to help you relax or for pain, you should not drive, operate heavy machinery or make major decisions for the next 12 hours.    Please contact a nurse at the Pain Treatment Center at Dept: [phone] should you have any questions or concerns regarding your treatment.      I have received and understand the above instructions.

## 2023-12-05 NOTE — Progress Notes (Unsigned)
 Patient VSS and ready for discharge post procedure. Discharge instructions reviewed with patient and understanding confirmed. Discharge instructions printed. Patient walked to checkout.      Driver present: Yes  IV removed: N/A

## 2023-12-06 ENCOUNTER — Telehealth: Payer: Self-pay | Admitting: Retina Ophthalmology

## 2023-12-06 NOTE — Telephone Encounter (Signed)
 12/5/2025I wanted you to be aware that the following patient has cancelled their appointment.Provider Name: Ramchandran Reason for Cancellation: had an accident, not getting around well, will call back to resched  Patient Rescheduled (Date/Time):  Patient did not reschedule, will call back  Patient does not want to reschedule  Patient aware of change of location from original scheduled appointment  Date of Appointment: 01/09/2024 Patient Name: [name] MRN: [id number] DOB: 05-20-1940 Thank rosine Penner Kasey Hansell

## 2023-12-10 ENCOUNTER — Ambulatory Visit: Payer: Medicare (Managed Care) | Admitting: Internal Medicine

## 2023-12-10 ENCOUNTER — Other Ambulatory Visit: Payer: Self-pay

## 2023-12-10 ENCOUNTER — Encounter: Payer: Self-pay | Admitting: Internal Medicine

## 2023-12-10 VITALS — BP 162/78 | HR 67 | Wt 137.0 lb

## 2023-12-10 DIAGNOSIS — M25562 Pain in left knee: Secondary | ICD-10-CM

## 2023-12-10 DIAGNOSIS — W19XXXA Unspecified fall, initial encounter: Secondary | ICD-10-CM

## 2023-12-10 MED ORDER — DOXYCYCLINE HYCLATE 100 MG PO TABS *I*: 100.0000 mg | ORAL_TABLET | Freq: Two times a day (BID) | ORAL | 0 refills | Status: AC

## 2023-12-10 MED ORDER — CELECOXIB 100 MG PO CAPS *I*: 100.0000 mg | ORAL_CAPSULE | Freq: Two times a day (BID) | ORAL | 1 refills | Status: AC

## 2023-12-10 MED ORDER — BUPIVACAINE HCL 0.5 % IJ SOLUTION *WRAPPED*
3.0000 mL | Freq: Once | INTRAMUSCULAR | Status: AC | PRN
  Administered 2023-12-05: 3 mL

## 2023-12-10 NOTE — Procedures (Signed)
 Date/Time: 12/05/2023  1:30 PM ESTFacet Joint Procedure(s): L/S level 1 w guidance - CPT 64493 and L/S level 2 w guidance - CPT 64494Laterality: BilateralAnesthetic medication - Left:  3 mL bupivacaine  0.5%Anesthetic medications - Right: 3 mL bupivacaine  0.5%LUMBAR MEDIAL BRANCH BLOCKS: bilateral at L4/5, L5/S112/9/2025Marie SHAUNNA Sage, [id number]VGiNOlBg$$MzfnczAzqnmzIZPI_vwjPjgnMClhvJGVNDoVfpvCzVGiNOlBg$  History   None  ALL MEDICATIONS PER MAR ABOVEPROCEDURE DIAGNOSIS:  Lumbar SpondylosisPROCEDURALIST:  [name], MDFluoroscopy Time: Included in above fluoro timeThis patient has had long-standing axial low back pain that has been refractory to conservative treatments. After risks and benefits were explained a signed consent was obtained. All questions were answered. The anticipated injection site was marked and initialed by Dr. Males.  The injection site was sterilely prepped and draped.  Final verification (time out) was performed by Dr Males and the assistants to ensure the correct patient identity, site and agreement on the procedure to be done.The patient was brought into the fluoroscopy suite and placed prone on the fluoroscopy table.  A pillow was placed under the patient's abdomen to flatten the lumbar lordosis.  Chlorhexidine skin prep was performed and the patient was sterilely draped prior to the procedure.  With the aid of a fluoroscope the lumbosacral skeletal anatomy was visualized.  The medial branches were targeted via a posterior oblique approach.  Skin and subcutaneous tissue along the expected needle tracts were anesthetized with 1% lidocaine  using a 27-gauge 1.5 inch needle.  Then 22-gauge 3.5 needles were used to target the medial branches.  The medial branch was targeted as it passed along the fossa formed by the superior articular process and transverse process at the the targeted level.  Appropriate needle tip position was confirmed in multiple fluoroscopic views.   Aspirate was confirmed negative for CSF and heme. Then 0.5 mL of 0.5% bupivacaine  was injected.  The stylet was replaced and the needle was removed.  The procedure was well tolerated.Following the procedure, the patient was given a diary so they can document the efficacy and duration of today's diagnostic injection.I was present for time out and for critical components of the procedure.

## 2023-12-10 NOTE — Progress Notes (Signed)
 SUBJECTIVEMarie P Farmer is a 83 y.o. female who presents for Fall26 year old female presents with knee pain, fallRegarding left knee pain, and fall, patient reports 1 week ago she slipped on the ice and fell.  She landed on her left knee.She was able to get up on her own, she has been ambulatory sinceShe notes a sense of persistent swelling and warmth to touch.  She notes pain with ambulation, at least the first few stepsShe finds that she has to use a walker the first few steps, but once she gets going the pain subsides and she is able to ambulate good distanceShe is getting around the home okay, she is able to use the bathroom in the kitchen without issue.  She walks from her car to the office, did not require a walkerShe was concerned that it still swollen and warm to touch, thus this visitShe regularly takes Tylenol  arthritis, for chronic back pain, she has not needed any additional medication.She reports no other injury from the fallPast Medical, Family and Social HistoryPatient's medications, allergies, past medical, surgical, social and family histories were updated and marked as reviewed, as appropriate.Review of Systems Musculoskeletal:  Positive for falls and joint pain. OBJECTIVEBP 162/78 (BP Location: Right arm, Patient Position: Sitting, Cuff Size: adult)   Pulse 67   Wt 62.1 kg (137 lb)   LMP  (LMP Unknown)   SpO2 99%   BMI 22.80 kg/m VITALS: reviewedPhysical ExamOn exam she is comfortable appearing, no evident distress, cooperative throughout.  Nurse vitals noted above.  Chart review confirms some lability of systolic blood pressure in the past.  She is seen with a friend, patient tells me the friend suffers from dementia.Patient is seen to ambulate.  She just slightly favors the left leg with ambulation.  Nothing pronounced.She gets up onto the exam table by herself.There is some mild warmth and erythema to  the left knee, there is a slight evident effusion.  Patella subluxation is slightly painful as is hitting the patella.  However the knee is otherwise perfectly functional, flexion and extension are normal.  There is no other functional abnormality of the knee.  There is no distal swelling.  Popliteal pulses 2+.  Lower extremity flexion and extension are 5/5 against counterforce, and plantarflexion/extension 5/5 against counterforce, no evident painASSESSMENT & PLAN1. Knee pain, left- celecoxib  (CELEBREX ) 100 mg capsule; Take 1 capsule (100 mg total) by mouth 2 times daily.  Dispense: 20 capsule; Refill: 1- doxycycline  hyclate (VIBRA -TABS) 100 mg tablet; Take 1 tablet (100 mg total) by mouth 2 times daily for 7 days for Infection of the Skin and/or Soft Tissue.  Dispense: 20 tablet; Refill: 02. FallMedication precautions reviewed.Clinical impression is that of a left knee effusion after traumatic injury.Difficult to assess whether this was simply just inflammatory, or whether there is a slight infective overlay-there is a potential entry points with this scrape of the knee and her fall.I do not think x-rays are required.  Rather we will simply treat with Celebrex  100 twice daily as an anti-inflammatory (she has well-tolerated this medication in the past), heat application twice daily regularly, I will cover her with doxycycline .Clinical impression is that of a fall, well explained.  No further new interventions are requiredPatient expresses appreciation and understanding of allShe otherwise will stay in touch with Dr. Vaughn regarding any progression of current issues or onset of new issues, and I encouraged her to contact the office in 1 week if there was still any evidence for swelling or discomfortDictation not checked for  errorsEncouraged follow up by phone/message if needed within a week.

## 2023-12-10 NOTE — Progress Notes (Signed)
 Letter of Medical NecessityInterval History: Post-Block:Diagnostic Block Assessment SheetMarie P [id number] Laterality / Levels: Bilateral L4/5, L5/S1 MBB #1 Block:  1 of 2Pre-procedure pain score:  10 at worstPost-procedure pain score:  2The diagnostic medial branch block produced a positive response of at least 80% relief of facet mediated pain for at least the expected minimum duration of the effect of the local anesthetic. Once the local anesthetic wore off, the pain again returned to 10 at worst. Yes:   Candidate for Radiofrequency Ablation: Yes:  [name], MDThe medial branch block performed on 12/05/23 provided the patient with 8 hours of 80% improvement of pain with improved range of motion and functional activities of daily living including cooking cleaning and household chores.PLAN:Bilateral L4/5, L5/S1 MBB #2Medical Necessity:The pain began on 2022The pain is currently rated 8/10 at worst.  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 - Trial of HE/physical therapy/chiropractic care, Dates: 10/24 - 9/25 with HEP. The patient was evaluated by the provider after completing treatment. - Home exercise program utilizing exercises learned in prior physical therapy sessions from a licensed physical therapist is currently ongoing on a daily basis.- Home exercise program prescribed by licensed healthcare professional.- Trial of medications for at least 3 months including: ibuprofen, naproxen , tylenol , flexeril .- Patient has failed to respond to conservative therapy including all of the following >6 weeks chiropractic, physical therapy and prescribed home exercise program  and NSAID > 3 weeks  and > 6 weeks activity modification.-The procedure  will be completed with the aid of fluoroscopic guidance.-Impact on activities of daily living: The pain is significantly impacting quality life and ability to participate in activities of daily living including; cooking, cleaning, household chores, pursuit of hobbies and gainful employment.-The clinical findings and imaging studies suggest no other obvious cause of the pain. The history, physical examination, and clinical correlation was performed for the diagnosis of this pain syndrome by an experienced pain medicine provider.-Please note that the [facility] is a comprehensive, multidisciplinary pain treatment program that includes coordination of physical therapy, patient education, psychosocial support, oral medication and injection based therapies.  The patient is therefore participating in a comprehensive multidisciplinary pain treatment program that includes all of the following: Physical therapy, patient education, psychosocial support, oral medication and injection based therapy.

## 2023-12-11 ENCOUNTER — Telehealth: Payer: Self-pay

## 2023-12-11 NOTE — Telephone Encounter (Signed)
 bilateral l4-s1 mbb #2

## 2023-12-13 NOTE — Telephone Encounter (Signed)
 This has been submitted thank you

## 2023-12-16 NOTE — Telephone Encounter (Signed)
Approved, thank you!

## 2024-01-03 ENCOUNTER — Other Ambulatory Visit: Payer: Self-pay | Admitting: Pain Medicine

## 2024-01-03 DIAGNOSIS — R52 Pain, unspecified: Secondary | ICD-10-CM

## 2024-01-08 ENCOUNTER — Other Ambulatory Visit: Payer: Self-pay

## 2024-01-09 ENCOUNTER — Ambulatory Visit: Payer: Medicare (Managed Care) | Attending: Pain Medicine | Admitting: Pain Medicine

## 2024-01-09 ENCOUNTER — Ambulatory Visit: Payer: Medicare (Managed Care) | Admitting: Retina Ophthalmology

## 2024-01-09 ENCOUNTER — Encounter: Payer: Self-pay | Admitting: Pain Medicine

## 2024-01-09 ENCOUNTER — Ambulatory Visit
Admission: RE | Admit: 2024-01-09 | Discharge: 2024-01-09 | Disposition: A | Discharge status: Home / Self Care | Payer: Medicare (Managed Care) | Source: Ambulatory Visit | Attending: Pain Medicine | Admitting: Pain Medicine

## 2024-01-09 VITALS — BP 152/86 | HR 71 | Temp 97.7°F | Resp 16

## 2024-01-09 DIAGNOSIS — R52 Pain, unspecified: Secondary | ICD-10-CM

## 2024-01-09 DIAGNOSIS — M47816 Spondylosis without myelopathy or radiculopathy, lumbar region: Secondary | ICD-10-CM | POA: Insufficient documentation

## 2024-01-09 DIAGNOSIS — M545 Low back pain, unspecified: Secondary | ICD-10-CM | POA: Insufficient documentation

## 2024-01-09 NOTE — Progress Notes (Signed)
Pt received discharge instructions from nurse status post procedure and received paperwork. Pt verbalizes understanding. Ambulatory from dept.

## 2024-01-09 NOTE — Progress Notes (Signed)
 Letter of Medical NecessityInterval History: Post-Block:Diagnostic Block Assessment SheetMarie Farmer [id number] Laterality / Levels: Bilateral L4/5, L5/S1 MBB #1 Block:  2 of 2Pre-procedure pain score:  10 at worstPost-procedure pain score:  2The diagnostic medial branch block produced a positive response of at least 80% relief of facet mediated pain for at least the expected minimum duration of the effect of the local anesthetic. Once the local anesthetic wore off, the pain again returned to 10 at worst. Yes:   Candidate for Radiofrequency Ablation: Yes:  [name], MDThe medial branch block performed on 12/05/23, 01/09/24 provided the patient with 8 hours of 80% improvement of pain with improved range of motion and functional activities of daily living including cooking cleaning and household chores.PLAN:Bilateral L4/5, L5/S1 RFAMedical Necessity:The pain began on 2022The pain is currently rated 8/10 at worst.  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 - Trial of HE/physical therapy/chiropractic care, Dates: 10/24 - 9/25 with HEP. The patient was evaluated by the provider after completing treatment. - Home exercise program utilizing exercises learned in prior physical therapy sessions from a licensed physical therapist is currently ongoing on a daily basis.- Home exercise program prescribed by licensed healthcare professional.- Trial of medications for at least 3 months including: ibuprofen, naproxen , tylenol , flexeril .- Patient has failed to respond to conservative therapy including all of the following >6 weeks chiropractic, physical therapy and prescribed home exercise program  and NSAID > 3 weeks  and > 6 weeks activity modification.-The  procedure will be completed with the aid of fluoroscopic guidance.-Impact on activities of daily living: The pain is significantly impacting quality life and ability to participate in activities of daily living including; cooking, cleaning, household chores, pursuit of hobbies and gainful employment.-The clinical findings and imaging studies suggest no other obvious cause of the pain. The history, physical examination, and clinical correlation was performed for the diagnosis of this pain syndrome by an experienced pain medicine provider.-Please note that the [facility] is a comprehensive, multidisciplinary pain treatment program that includes coordination of physical therapy, patient education, psychosocial support, oral medication and injection based therapies.  The patient is therefore participating in a comprehensive multidisciplinary pain treatment program that includes all of the following: Physical therapy, patient education, psychosocial support, oral medication and injection based therapy.

## 2024-01-09 NOTE — Patient Instructions (Signed)
 [facility]  Facet Joint Injection  Discharge Instructions        Your doctor has given you a facet joint injection (s).      []  You have received a diagnostic facet injection (s).  This means you have received numbing medicine only at the site(s) of your pain.  This will wear off over the next 1 to 12 hours.   []  You have received a therapeutic facet injection(s).  This means that you received numbing medicine and a steroid to help with inflammation.  The numbing medicine is temporary in relief.    After the injection you may notice the following:    1. Increased tenderness in the area of your usual pain  2. Mild extremity weakness    These effects are temporary and will diminish over the next several days.    For injection site tenderness, apply an ice pack to the injection site for a 20 minute period, then remove for one hour.  Repeat as necessary.    Please notify the Pain Treatment Center if you notice:    1. Redness, swelling, or drainage from the injection sites, or have a fever.  2. Prolonged extremity weakness (lasting more than 3 days)    Activity:  You may carry out your regular daily activities, avoid doing things that you have not done for some time (example: heavy housework or lifting etc).  It is very important you continue your regular exercise program.  If you have received any medications to help you relax or for pain, you should not drive, operate heavy machinery or make major decisions for the next 12 hours.    Please contact a nurse at the Pain Treatment Center at Dept: [phone] should you have any questions or concerns regarding your treatment.      I have received and understand the above instructions.

## 2024-01-09 NOTE — Progress Notes (Signed)
Patient was examined prior to this procedure.      Change in health status: No      Interval change in structure, distribution quality of pain: No      Interval Change:   Medication Allergies:  Yes   Contrast Dye Allergies: No   Latex Allergies:  No   Current Antibiotics:  No   Current Blood Thinners: No   Recent Fevers/Chills:  No   History of Diabetes:  No   Current Steroids:  No   NPO:    Yes    If diagnostic block, the planned procedure is the 2 in a series of 2 diagnostic injections.      Plan: proceed with bilateral L4/5, L5/S1 MBB

## 2024-01-15 ENCOUNTER — Other Ambulatory Visit: Payer: Self-pay

## 2024-01-15 MED ORDER — BUPIVACAINE HCL 0.5 % IJ SOLUTION *WRAPPED*
3.0000 mL | Freq: Once | INTRAMUSCULAR | Status: AC | PRN
  Administered 2024-01-09: 3 mL

## 2024-01-15 NOTE — Procedures (Signed)
 Date/Time: 01/09/2024  1:50 PM ESTFacet Joint Procedure(s): L/S level 1 w guidance - CPT 64493 and L/S level 2 w guidance - CPT 64494Laterality: BilateralAnesthetic medication - Left:  3 mL bupivacaine  0.5%Anesthetic medications - Right: 3 mL bupivacaine  0.5%LUMBAR MEDIAL BRANCH BLOCKS: bilateral at L4/5, L5/S11/14/2026Marie SHAUNNA Sage, [id number]$MzfnczAzqnmzIZPI_SpIbyhZiaQgyVHLFPolwqdgErWDQTdxy$$MzfnczAzqnmzIZPI_SpIbyhZiaQgyVHLFPolwqdgErWDQTdxy$  History   None  ALL MEDICATIONS PER MAR ABOVEPROCEDURE DIAGNOSIS:  Lumbar SpondylosisPROCEDURALIST:  [name], MDFluoroscopy Time: Included in above fluoro timeThis patient has had long-standing axial low back pain that has been refractory to conservative treatments. After risks and benefits were explained a signed consent was obtained. All questions were answered. The anticipated injection site was marked and initialed by Dr. Males.  The injection site was sterilely prepped and draped.  Final verification (time out) was performed by Dr Males and the assistants to ensure the correct patient identity, site and agreement on the procedure to be done.The patient was brought into the fluoroscopy suite and placed prone on the fluoroscopy table.  A pillow was placed under the patient's abdomen to flatten the lumbar lordosis.  Chlorhexidine skin prep was performed and the patient was sterilely draped prior to the procedure.  With the aid of a fluoroscope the lumbosacral skeletal anatomy was visualized.  The medial branches were targeted via a posterior oblique approach.  Skin and subcutaneous tissue along the expected needle tracts were anesthetized with 1% lidocaine  using a 27-gauge 1.5 inch needle.  Then 22-gauge 3.5 needles were used to target the medial branches.  The medial branch was targeted as it passed along the fossa formed by the superior articular process and transverse process at the the targeted level.  Appropriate needle tip position was confirmed in multiple fluoroscopic views.   Aspirate was confirmed negative for CSF and heme. Then 0.5 mL of 0.5% bupivacaine  was injected.  The stylet was replaced and the needle was removed.  The procedure was well tolerated.Following the procedure, the patient was given a diary so they can document the efficacy and duration of today's diagnostic injection.I was present for time out and for critical components of the procedure.

## 2024-01-16 ENCOUNTER — Telehealth: Payer: Self-pay

## 2024-01-16 ENCOUNTER — Encounter: Payer: Self-pay | Admitting: Gastroenterology

## 2024-01-16 ENCOUNTER — Ambulatory Visit: Payer: Medicare (Managed Care) | Attending: Family Medicine | Admitting: Audiologist

## 2024-01-16 DIAGNOSIS — H903 Sensorineural hearing loss, bilateral: Secondary | ICD-10-CM | POA: Insufficient documentation

## 2024-01-16 NOTE — Telephone Encounter (Signed)
Bilateral L4/5, L5/S1 RFA.

## 2024-01-16 NOTE — Progress Notes (Signed)
 [facility] [phone] [facility] [address]. [address] [place], [place] [address]E[place]: [phone]: [phone] Name: Diane Viramontes KleinMRN: [id number] Date of Birth: 03/25/1942Date of Fitting: 01/16/2024  The patient was seen today for a hearing aid fitting and was accompanied to the appointment by her son, [name].The following hearing aid was qpu:Mphyu Make: Phonak Model: Naida L30 PR Style: BTE Serial Number: [id number] Repair Warranty: 01/15/27 Loss & Damage Warranty: 01/15/27              Use, care, and maintenance of the device were discussed in detail. Insertion and removal of the hearing aid and use of the charger was demonstrated and practiced.  All instruction and/or counseling was performed with the patient. Use of volume control was demonstrated.  Overall gain was set to 80% target.  Simulated real ear measures (NAL-NL2 targets) suggest appropriate output and satisfactory gain for the patients hearing status. The patient reported that she was pleased with the physical fit and sound quality of the hearing aid.  The patient was encouraged to wear the hearing aid during all waking hours of the day.  The patient was provided with written walk-in clinic information and hours of operation.  The patient was advised that walk-in clinic appointments are only to be used for minor hearing aid repairs and troubleshooting performed within the clinic, to pick up hearing aid supplies, and if the hearing aid needs to be sent in for manufacturer repair.  Hearing aid adjustments/reprogramming (eg too loud, too soft, too tinny, etc) require the services of the audiologist and therefore require a scheduled appointment, this also includes poor fitting hearing aids/earmolds and/or feedback.The patient was dispensed a supply of batteries, was reminded of the 45-day trial period and warranties, and was reminded to keep the hearing aid in the case/charger to prevent  loss/damage and to avoid water  when wearing the hearing aid. A copy of the patient's audiogram and signed purchase agreement were provided to the patient. A copy will be maintained in patients electronic chart.RECOMMENDATIONS:Hearing aid check scheduled for 2 weeks.[name], AuD, CCC-AAudiologistUR [facility]

## 2024-01-16 NOTE — Telephone Encounter (Signed)
 Hi,Plan has approved PAR and the flowsheet updated.  Documentation sent to scanning.Thank you,Angle Dirusso

## 2024-01-30 ENCOUNTER — Other Ambulatory Visit: Payer: Self-pay | Admitting: Pain Medicine

## 2024-01-30 DIAGNOSIS — R52 Pain, unspecified: Secondary | ICD-10-CM

## 2024-02-04 ENCOUNTER — Other Ambulatory Visit: Payer: Self-pay

## 2024-02-05 ENCOUNTER — Ambulatory Visit: Payer: Medicare (Managed Care) | Admitting: Audiologist

## 2024-02-05 ENCOUNTER — Other Ambulatory Visit: Payer: Self-pay

## 2024-02-05 DIAGNOSIS — H903 Sensorineural hearing loss, bilateral: Secondary | ICD-10-CM

## 2024-02-05 NOTE — Progress Notes (Signed)
 Hearing Aid Check [facility]. [address] [place], [place] [address]E[place]: [phone]: [phone] Name: Diane Naves KleinMRN: [id number] Date of Birth: November 11, 1940   Date of Service: 02/05/2024 Current hearing aid information: Ear:    RightMake:     PhonakModel:    Naida L30 PR Serial #:   [id number]:   01/15/27 The patient was seen today for a hearing aid check, and was accompanied by her sister.  The patient reports she has been pleased with the hearing aid but feels the phone is too loud when holding it up to the right ear so she has been using her left ear on the cell phone.  Patient reports the television volume is lower with the hearing aid than it was before she received the hearing aid.  A phone call was simulated in the office and patient was shown how to adjust the phone volume using the phone volume buttons to find a comfortable volume.  Visual check/listening check of the hearing aid revealed that the hearing aid is working satisfactorily. The patient reports that she is pleased with the sound quality, volume level, and physical fit of the hearing aid.  Patient declined any programming changes today.  She was reminded of walk-in clinic hours and procedures.Recommendations:Hearing aid check in 6 months, or sooner with concerns.[name], AuD, CCC-AAudiologist[facility]

## 2024-02-06 ENCOUNTER — Encounter: Payer: Self-pay | Admitting: Pain Medicine

## 2024-02-06 ENCOUNTER — Ambulatory Visit: Admission: RE | Admit: 2024-02-06 | Payer: Medicare (Managed Care) | Source: Ambulatory Visit

## 2024-02-06 ENCOUNTER — Ambulatory Visit: Payer: Medicare (Managed Care) | Admitting: Pain Medicine

## 2024-02-06 VITALS — BP 143/75 | HR 66 | Temp 97.9°F | Resp 16

## 2024-02-06 DIAGNOSIS — R52 Pain, unspecified: Secondary | ICD-10-CM

## 2024-02-06 DIAGNOSIS — M25569 Pain in unspecified knee: Secondary | ICD-10-CM

## 2024-02-06 DIAGNOSIS — M47816 Spondylosis without myelopathy or radiculopathy, lumbar region: Secondary | ICD-10-CM

## 2024-02-06 NOTE — Patient Instructions (Signed)
 [facility]  Radio-Frequency Ablation (RFA) Procedure  Discharge instructions      You have had a procedure called radiofrequency ablation.  This procedure uses radio waves directed to the nerve that may be contributing to your pain.    Watch for the following, and notify the office if you notice any of the following:    1. Infection: observe the incision/puncture site for signs of infection: Redness, Drainage, Warmth in the area, Fever, Swelling (mild swelling might be normal after this procedure)     2.  Pain: You may have some localized discomfort at the procedure site.  Apply an ice pack for 20 minutes on then one hour off may be helpful.      3.   Bleeding: There should only be a scant amount of bleeding during or after procedure.      4.  Numbness:  You may notice a small area of numbness in that particular area after the procedure, this is common.  Notify the office if you have numbness in any area other than the insertion site.    5.  Weakness:  If you have received medication to sedate you, you should not drive or make any major decisions for at least 12 hours. If you have had a cervical procedure, do not drive immediately following the procedure. Contact the office if you have persistent weakness in your arms or legs after procedure.    6.  Difficulty breathing:  If the procedure is done in the chest area, notify the office if you have any difficulty in breathing.    7. Difficulty in urinating or moving bowels:  If the procedure was done in the groin/pelvic area, notify the office if you have any difficulty in passing urine or bowel movements.    Activity:  You can resume your regular activity the day after the procedure. If you have received any medication to help you relax or for pain,  you should not drive, operate heavy machinery or make major decisions for the next 12 hours.    Please contact the [facility] at Dept: [phone] should you have any  questions or concerns.      I have received and understand the above information.

## 2024-02-06 NOTE — Progress Notes (Unsigned)
Patient was examined prior to this procedure.      Change in health status: No      Interval change in structure, distribution quality of pain: No      Interval Change:   Medication Allergies:  Yes   Contrast Dye Allergies: No   Latex Allergies:  No   Current Antibiotics:  No   Current Blood Thinners: No   Recent Fevers/Chills:  No   History of Diabetes:  No   Current Steroids:  No   NPO:    Yes        Plan: proceed with Bilateral L4/5, L5/S1 RFA

## 2024-02-06 NOTE — Progress Notes (Unsigned)
Patient VSS and ready for discharge post procedure. Discharge instructions reviewed with patient and understanding confirmed. Discharge instructions printed. Patient walked to waiting room.      Driver present: Yes  IV removed: N/A

## 2024-02-12 ENCOUNTER — Ambulatory Visit: Payer: Medicare (Managed Care) | Admitting: Audiologist

## 2024-02-18 ENCOUNTER — Ambulatory Visit: Payer: Medicare (Managed Care) | Admitting: Family Medicine

## 2024-03-09 ENCOUNTER — Ambulatory Visit: Payer: Medicare (Managed Care) | Admitting: Pain Medicine

## 2024-04-07 ENCOUNTER — Other Ambulatory Visit: Payer: Medicare (Managed Care)

## 2024-07-09 ENCOUNTER — Ambulatory Visit: Payer: Medicare (Managed Care) | Admitting: Ophthalmology

## 2024-08-13 ENCOUNTER — Ambulatory Visit: Payer: Medicare (Managed Care) | Admitting: Audiologist
